# Patient Record
Sex: Female | Born: 1965 | Race: White | Hispanic: No | Marital: Married | Smoking: Former smoker
Health system: Southern US, Community
[De-identification: ages and names within clinical notes are randomized; demographics above are authoritative.]

## PROBLEM LIST (undated history)

## (undated) DIAGNOSIS — M7580 Other shoulder lesions, unspecified shoulder: Secondary | ICD-10-CM

## (undated) DIAGNOSIS — M549 Dorsalgia, unspecified: Secondary | ICD-10-CM

## (undated) DIAGNOSIS — G8929 Other chronic pain: Secondary | ICD-10-CM

## (undated) DIAGNOSIS — E785 Hyperlipidemia, unspecified: Secondary | ICD-10-CM

## (undated) DIAGNOSIS — A310 Pulmonary mycobacterial infection: Secondary | ICD-10-CM

## (undated) DIAGNOSIS — J449 Chronic obstructive pulmonary disease, unspecified: Secondary | ICD-10-CM

## (undated) DIAGNOSIS — I341 Nonrheumatic mitral (valve) prolapse: Secondary | ICD-10-CM

## (undated) DIAGNOSIS — K219 Gastro-esophageal reflux disease without esophagitis: Secondary | ICD-10-CM

## (undated) DIAGNOSIS — Z8701 Personal history of pneumonia (recurrent): Secondary | ICD-10-CM

## (undated) DIAGNOSIS — F419 Anxiety disorder, unspecified: Secondary | ICD-10-CM

## (undated) DIAGNOSIS — R112 Nausea with vomiting, unspecified: Secondary | ICD-10-CM

## (undated) DIAGNOSIS — Z780 Asymptomatic menopausal state: Secondary | ICD-10-CM

## (undated) DIAGNOSIS — G35 Multiple sclerosis: Secondary | ICD-10-CM

## (undated) DIAGNOSIS — R079 Chest pain, unspecified: Secondary | ICD-10-CM

## (undated) DIAGNOSIS — F329 Major depressive disorder, single episode, unspecified: Secondary | ICD-10-CM

## (undated) DIAGNOSIS — K529 Noninfective gastroenteritis and colitis, unspecified: Secondary | ICD-10-CM

## (undated) DIAGNOSIS — F17200 Nicotine dependence, unspecified, uncomplicated: Secondary | ICD-10-CM

## (undated) DIAGNOSIS — Z9889 Other specified postprocedural states: Secondary | ICD-10-CM

## (undated) DIAGNOSIS — J984 Other disorders of lung: Secondary | ICD-10-CM

## (undated) DIAGNOSIS — M1990 Unspecified osteoarthritis, unspecified site: Secondary | ICD-10-CM

## (undated) DIAGNOSIS — F32A Depression, unspecified: Secondary | ICD-10-CM

## (undated) DIAGNOSIS — G43909 Migraine, unspecified, not intractable, without status migrainosus: Secondary | ICD-10-CM

## (undated) HISTORY — DX: Chest pain, unspecified: R07.9

## (undated) HISTORY — DX: Other chronic pain: G89.29

## (undated) HISTORY — DX: Migraine, unspecified, not intractable, without status migrainosus: G43.909

## (undated) HISTORY — PX: LIVER BIOPSY: SHX301

## (undated) HISTORY — DX: Dorsalgia, unspecified: M54.9

## (undated) HISTORY — DX: Pulmonary mycobacterial infection: A31.0

## (undated) HISTORY — DX: Noninfective gastroenteritis and colitis, unspecified: K52.9

## (undated) HISTORY — DX: Anxiety disorder, unspecified: F41.9

## (undated) HISTORY — DX: Personal history of pneumonia (recurrent): Z87.01

## (undated) HISTORY — DX: Chronic obstructive pulmonary disease, unspecified: J44.9

## (undated) HISTORY — DX: Other shoulder lesions, unspecified shoulder: M75.80

## (undated) HISTORY — DX: Hyperlipidemia, unspecified: E78.5

## (undated) HISTORY — DX: Nonrheumatic mitral (valve) prolapse: I34.1

## (undated) HISTORY — DX: Gastro-esophageal reflux disease without esophagitis: K21.9

## (undated) HISTORY — DX: Depression, unspecified: F32.A

## (undated) HISTORY — DX: Nicotine dependence, unspecified, uncomplicated: F17.200

## (undated) HISTORY — DX: Asymptomatic menopausal state: Z78.0

## (undated) HISTORY — DX: Unspecified osteoarthritis, unspecified site: M19.90

## (undated) HISTORY — DX: Major depressive disorder, single episode, unspecified: F32.9

## (undated) HISTORY — DX: Multiple sclerosis: G35

---

## 1999-09-10 DIAGNOSIS — G35 Multiple sclerosis: Secondary | ICD-10-CM

## 1999-09-10 HISTORY — DX: Multiple sclerosis: G35

## 2001-02-17 ENCOUNTER — Ambulatory Visit (HOSPITAL_COMMUNITY): Admission: RE | Admit: 2001-02-17 | Discharge: 2001-02-17 | Payer: Self-pay | Admitting: Internal Medicine

## 2001-02-17 ENCOUNTER — Encounter: Payer: Self-pay | Admitting: Internal Medicine

## 2001-03-26 ENCOUNTER — Ambulatory Visit (HOSPITAL_COMMUNITY): Admission: RE | Admit: 2001-03-26 | Discharge: 2001-03-26 | Payer: Self-pay | Admitting: Internal Medicine

## 2001-03-26 ENCOUNTER — Encounter: Payer: Self-pay | Admitting: Internal Medicine

## 2001-03-27 ENCOUNTER — Ambulatory Visit (HOSPITAL_COMMUNITY): Admission: RE | Admit: 2001-03-27 | Discharge: 2001-03-30 | Payer: Self-pay | Admitting: Internal Medicine

## 2001-03-30 ENCOUNTER — Encounter: Payer: Self-pay | Admitting: Internal Medicine

## 2001-04-07 ENCOUNTER — Encounter: Payer: Self-pay | Admitting: Internal Medicine

## 2001-04-07 ENCOUNTER — Ambulatory Visit (HOSPITAL_COMMUNITY): Admission: RE | Admit: 2001-04-07 | Discharge: 2001-04-07 | Payer: Self-pay | Admitting: Internal Medicine

## 2001-04-24 ENCOUNTER — Ambulatory Visit (HOSPITAL_COMMUNITY): Admission: RE | Admit: 2001-04-24 | Discharge: 2001-04-24 | Payer: Self-pay | Admitting: Internal Medicine

## 2001-04-28 ENCOUNTER — Encounter: Payer: Self-pay | Admitting: Internal Medicine

## 2001-04-28 ENCOUNTER — Ambulatory Visit (HOSPITAL_COMMUNITY): Admission: RE | Admit: 2001-04-28 | Discharge: 2001-04-28 | Payer: Self-pay | Admitting: Internal Medicine

## 2001-09-09 DIAGNOSIS — R079 Chest pain, unspecified: Secondary | ICD-10-CM

## 2001-09-09 HISTORY — PX: ESOPHAGOGASTRODUODENOSCOPY: SHX1529

## 2001-09-09 HISTORY — DX: Chest pain, unspecified: R07.9

## 2001-09-09 HISTORY — PX: OTHER SURGICAL HISTORY: SHX169

## 2001-11-20 ENCOUNTER — Ambulatory Visit (HOSPITAL_COMMUNITY): Admission: RE | Admit: 2001-11-20 | Discharge: 2001-11-20 | Payer: Self-pay | Admitting: Pulmonary Disease

## 2001-11-23 ENCOUNTER — Encounter: Payer: Self-pay | Admitting: Infectious Diseases

## 2001-11-26 ENCOUNTER — Ambulatory Visit (HOSPITAL_COMMUNITY): Admission: RE | Admit: 2001-11-26 | Discharge: 2001-11-27 | Payer: Self-pay | Admitting: Pulmonary Disease

## 2001-11-26 ENCOUNTER — Ambulatory Visit (HOSPITAL_COMMUNITY): Admission: RE | Admit: 2001-11-26 | Discharge: 2001-11-26 | Payer: Self-pay | Admitting: Pulmonary Disease

## 2001-11-30 ENCOUNTER — Encounter: Payer: Self-pay | Admitting: Internal Medicine

## 2001-11-30 ENCOUNTER — Ambulatory Visit (HOSPITAL_COMMUNITY): Admission: RE | Admit: 2001-11-30 | Discharge: 2001-11-30 | Payer: Self-pay | Admitting: Internal Medicine

## 2002-01-05 ENCOUNTER — Other Ambulatory Visit: Admission: RE | Admit: 2002-01-05 | Discharge: 2002-01-05 | Payer: Self-pay | Admitting: Unknown Physician Specialty

## 2002-01-23 ENCOUNTER — Emergency Department (HOSPITAL_COMMUNITY): Admission: EM | Admit: 2002-01-23 | Discharge: 2002-01-23 | Payer: Self-pay | Admitting: *Deleted

## 2002-03-23 ENCOUNTER — Ambulatory Visit (HOSPITAL_COMMUNITY): Admission: RE | Admit: 2002-03-23 | Discharge: 2002-03-23 | Payer: Self-pay | Admitting: Internal Medicine

## 2002-05-24 ENCOUNTER — Encounter: Payer: Self-pay | Admitting: Internal Medicine

## 2002-05-24 ENCOUNTER — Ambulatory Visit (HOSPITAL_COMMUNITY): Admission: RE | Admit: 2002-05-24 | Discharge: 2002-05-24 | Payer: Self-pay | Admitting: Internal Medicine

## 2002-06-24 ENCOUNTER — Ambulatory Visit (HOSPITAL_COMMUNITY): Admission: RE | Admit: 2002-06-24 | Discharge: 2002-06-24 | Payer: Self-pay | Admitting: Neurosurgery

## 2002-07-30 ENCOUNTER — Ambulatory Visit (HOSPITAL_COMMUNITY): Admission: RE | Admit: 2002-07-30 | Discharge: 2002-07-30 | Payer: Self-pay | Admitting: Psychiatry

## 2002-07-30 ENCOUNTER — Encounter: Payer: Self-pay | Admitting: Psychiatry

## 2003-09-10 DIAGNOSIS — K529 Noninfective gastroenteritis and colitis, unspecified: Secondary | ICD-10-CM

## 2003-09-10 HISTORY — DX: Noninfective gastroenteritis and colitis, unspecified: K52.9

## 2003-10-10 ENCOUNTER — Ambulatory Visit (HOSPITAL_COMMUNITY): Admission: RE | Admit: 2003-10-10 | Discharge: 2003-10-10 | Payer: Self-pay | Admitting: Internal Medicine

## 2003-11-08 ENCOUNTER — Encounter: Payer: Self-pay | Admitting: Infectious Diseases

## 2003-11-08 ENCOUNTER — Ambulatory Visit (HOSPITAL_COMMUNITY): Admission: RE | Admit: 2003-11-08 | Discharge: 2003-11-08 | Payer: Self-pay | Admitting: Pulmonary Disease

## 2004-08-19 ENCOUNTER — Ambulatory Visit: Payer: Self-pay | Admitting: Internal Medicine

## 2004-08-19 ENCOUNTER — Inpatient Hospital Stay (HOSPITAL_COMMUNITY): Admission: EM | Admit: 2004-08-19 | Discharge: 2004-08-21 | Payer: Self-pay | Admitting: Emergency Medicine

## 2004-09-04 ENCOUNTER — Ambulatory Visit: Payer: Self-pay | Admitting: Internal Medicine

## 2004-09-09 HISTORY — PX: COLONOSCOPY W/ POLYPECTOMY: SHX1380

## 2004-09-12 ENCOUNTER — Ambulatory Visit: Payer: Self-pay | Admitting: Internal Medicine

## 2004-09-12 ENCOUNTER — Ambulatory Visit (HOSPITAL_COMMUNITY): Admission: RE | Admit: 2004-09-12 | Discharge: 2004-09-12 | Payer: Self-pay | Admitting: Internal Medicine

## 2004-12-31 ENCOUNTER — Ambulatory Visit (HOSPITAL_COMMUNITY): Admission: RE | Admit: 2004-12-31 | Discharge: 2004-12-31 | Payer: Self-pay | Admitting: Otolaryngology

## 2005-02-08 ENCOUNTER — Ambulatory Visit (HOSPITAL_COMMUNITY): Admission: RE | Admit: 2005-02-08 | Discharge: 2005-02-08 | Payer: Self-pay | Admitting: Otolaryngology

## 2005-03-28 ENCOUNTER — Ambulatory Visit (HOSPITAL_COMMUNITY): Admission: RE | Admit: 2005-03-28 | Discharge: 2005-03-28 | Payer: Self-pay | Admitting: Internal Medicine

## 2007-09-10 DIAGNOSIS — K219 Gastro-esophageal reflux disease without esophagitis: Secondary | ICD-10-CM

## 2007-09-10 DIAGNOSIS — E785 Hyperlipidemia, unspecified: Secondary | ICD-10-CM

## 2007-09-10 HISTORY — DX: Hyperlipidemia, unspecified: E78.5

## 2007-09-10 HISTORY — PX: ESOPHAGOGASTRODUODENOSCOPY: SHX1529

## 2007-09-10 HISTORY — DX: Gastro-esophageal reflux disease without esophagitis: K21.9

## 2007-11-17 ENCOUNTER — Emergency Department (HOSPITAL_COMMUNITY): Admission: EM | Admit: 2007-11-17 | Discharge: 2007-11-17 | Payer: Self-pay | Admitting: Emergency Medicine

## 2007-11-18 ENCOUNTER — Observation Stay (HOSPITAL_COMMUNITY): Admission: EM | Admit: 2007-11-18 | Discharge: 2007-11-18 | Payer: Self-pay | Admitting: Emergency Medicine

## 2008-03-15 ENCOUNTER — Ambulatory Visit (HOSPITAL_COMMUNITY): Admission: RE | Admit: 2008-03-15 | Discharge: 2008-03-15 | Payer: Self-pay | Admitting: Internal Medicine

## 2008-03-29 ENCOUNTER — Ambulatory Visit: Payer: Self-pay | Admitting: Internal Medicine

## 2008-04-14 ENCOUNTER — Ambulatory Visit: Payer: Self-pay | Admitting: Internal Medicine

## 2008-04-14 ENCOUNTER — Ambulatory Visit (HOSPITAL_COMMUNITY): Admission: RE | Admit: 2008-04-14 | Discharge: 2008-04-14 | Payer: Self-pay | Admitting: Internal Medicine

## 2008-08-22 ENCOUNTER — Ambulatory Visit (HOSPITAL_COMMUNITY): Admission: RE | Admit: 2008-08-22 | Discharge: 2008-08-22 | Payer: Self-pay | Admitting: Internal Medicine

## 2008-09-05 ENCOUNTER — Ambulatory Visit (HOSPITAL_COMMUNITY): Admission: RE | Admit: 2008-09-05 | Discharge: 2008-09-05 | Payer: Self-pay | Admitting: Internal Medicine

## 2008-09-09 DIAGNOSIS — A310 Pulmonary mycobacterial infection: Secondary | ICD-10-CM

## 2008-09-09 DIAGNOSIS — J984 Other disorders of lung: Secondary | ICD-10-CM

## 2008-09-09 HISTORY — DX: Other disorders of lung: J98.4

## 2008-09-09 HISTORY — DX: Pulmonary mycobacterial infection: A31.0

## 2008-09-09 HISTORY — PX: FIBEROPTIC BRONCHOSCOPY: SHX5367

## 2008-10-26 ENCOUNTER — Ambulatory Visit (HOSPITAL_COMMUNITY): Admission: RE | Admit: 2008-10-26 | Discharge: 2008-10-26 | Payer: Self-pay | Admitting: Internal Medicine

## 2008-10-26 ENCOUNTER — Encounter: Payer: Self-pay | Admitting: Infectious Diseases

## 2008-11-14 ENCOUNTER — Encounter: Payer: Self-pay | Admitting: Infectious Diseases

## 2008-11-16 ENCOUNTER — Ambulatory Visit (HOSPITAL_COMMUNITY): Admission: RE | Admit: 2008-11-16 | Discharge: 2008-11-16 | Payer: Self-pay | Admitting: Pulmonary Disease

## 2008-11-16 ENCOUNTER — Encounter (INDEPENDENT_AMBULATORY_CARE_PROVIDER_SITE_OTHER): Payer: Self-pay | Admitting: Pulmonary Disease

## 2008-11-28 ENCOUNTER — Ambulatory Visit: Payer: Self-pay | Admitting: Thoracic Surgery

## 2008-12-05 ENCOUNTER — Ambulatory Visit (HOSPITAL_COMMUNITY): Admission: RE | Admit: 2008-12-05 | Discharge: 2008-12-05 | Payer: Self-pay | Admitting: Thoracic Surgery

## 2008-12-05 ENCOUNTER — Encounter: Payer: Self-pay | Admitting: Thoracic Surgery

## 2008-12-05 ENCOUNTER — Ambulatory Visit: Payer: Self-pay | Admitting: Thoracic Surgery

## 2008-12-07 ENCOUNTER — Ambulatory Visit: Payer: Self-pay | Admitting: Thoracic Surgery

## 2008-12-07 ENCOUNTER — Encounter: Payer: Self-pay | Admitting: Infectious Diseases

## 2009-03-20 ENCOUNTER — Other Ambulatory Visit: Admission: RE | Admit: 2009-03-20 | Discharge: 2009-03-20 | Payer: Self-pay | Admitting: Obstetrics and Gynecology

## 2009-03-22 ENCOUNTER — Ambulatory Visit (HOSPITAL_COMMUNITY): Admission: RE | Admit: 2009-03-22 | Discharge: 2009-03-22 | Payer: Self-pay | Admitting: Obstetrics & Gynecology

## 2009-03-30 ENCOUNTER — Encounter: Payer: Self-pay | Admitting: Infectious Diseases

## 2009-03-30 DIAGNOSIS — G43909 Migraine, unspecified, not intractable, without status migrainosus: Secondary | ICD-10-CM

## 2009-03-30 DIAGNOSIS — R05 Cough: Secondary | ICD-10-CM

## 2009-03-30 DIAGNOSIS — R059 Cough, unspecified: Secondary | ICD-10-CM | POA: Insufficient documentation

## 2009-04-19 ENCOUNTER — Ambulatory Visit: Payer: Self-pay | Admitting: Infectious Diseases

## 2009-04-19 DIAGNOSIS — R5381 Other malaise: Secondary | ICD-10-CM | POA: Insufficient documentation

## 2009-04-19 DIAGNOSIS — R5383 Other fatigue: Secondary | ICD-10-CM

## 2009-04-19 LAB — CONVERTED CEMR LAB
ALT: <8 units/L (ref 0–35)
AST: 13 units/L (ref 0–37)
Albumin: 4.5 g/dL (ref 3.5–5.2)
Calcium: 9.7 mg/dL (ref 8.4–10.5)
Chloride: 108 meq/L (ref 96–112)
Creatinine, Ser: 1.04 mg/dL (ref 0.40–1.20)
Lymphocytes Relative: 42 % (ref 12–46)
Lymphs Abs: 3.7 10*3/uL (ref 0.7–4.0)
Monocytes Relative: 7 % (ref 3–12)
Neutro Abs: 4.4 10*3/uL (ref 1.7–7.7)
Neutrophils Relative %: 49 % (ref 43–77)
Potassium: 4.7 meq/L (ref 3.5–5.3)
RBC: 4.46 M/uL (ref 3.87–5.11)
Sed Rate: 2 mm/hr (ref 0–22)
Sodium: 140 meq/L (ref 135–145)
WBC: 8.9 10*3/uL (ref 4.0–10.5)

## 2009-04-21 ENCOUNTER — Telehealth: Payer: Self-pay | Admitting: Infectious Diseases

## 2009-04-25 ENCOUNTER — Ambulatory Visit (HOSPITAL_COMMUNITY): Admission: RE | Admit: 2009-04-25 | Discharge: 2009-04-25 | Payer: Self-pay | Admitting: Infectious Diseases

## 2009-10-31 ENCOUNTER — Ambulatory Visit: Payer: Self-pay | Admitting: Infectious Diseases

## 2009-10-31 ENCOUNTER — Ambulatory Visit (HOSPITAL_COMMUNITY): Admission: RE | Admit: 2009-10-31 | Discharge: 2009-10-31 | Payer: Self-pay | Admitting: Infectious Diseases

## 2009-10-31 DIAGNOSIS — M549 Dorsalgia, unspecified: Secondary | ICD-10-CM | POA: Insufficient documentation

## 2009-12-14 ENCOUNTER — Telehealth: Payer: Self-pay | Admitting: Infectious Diseases

## 2010-01-29 ENCOUNTER — Telehealth: Payer: Self-pay | Admitting: Infectious Diseases

## 2010-02-06 ENCOUNTER — Ambulatory Visit: Payer: Self-pay | Admitting: Infectious Diseases

## 2010-03-07 ENCOUNTER — Telehealth: Payer: Self-pay | Admitting: Infectious Diseases

## 2010-03-27 IMAGING — CT CT HEAD W/O CM
3 series · 18 of 30 positions shown, 20 images · non-contrast
Comparison: 11/30/2001

CLINICAL DATA: History migraines

CT HEAD WITHOUT CONTRAST
TECHNIQUE: Contiguous axial images were obtained from the base of
the skull through the vertex without contrast.

[Series 2: headseq 4.8 h37s · axial · 0.43mm/px · z∈[+72,+207]mm · 8 of 36 slices shown, 10 images (1 of 2)]
[im 4/36  brain]
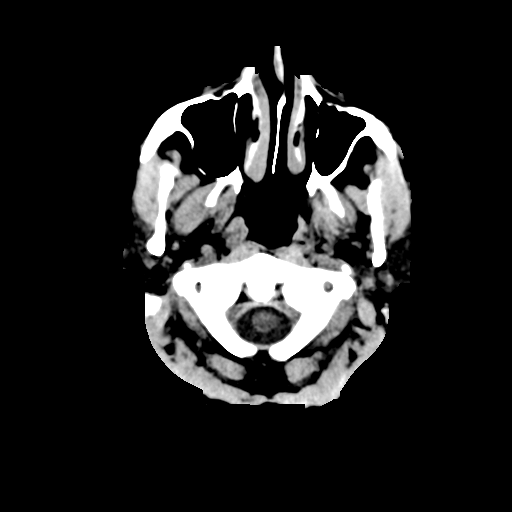
[im 4/36  bone]
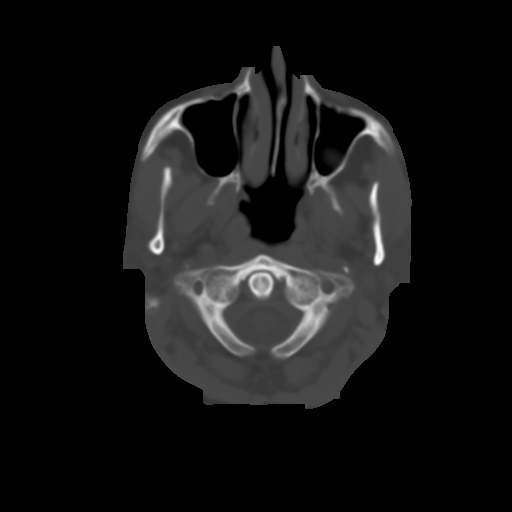
[im 8/36  brain]
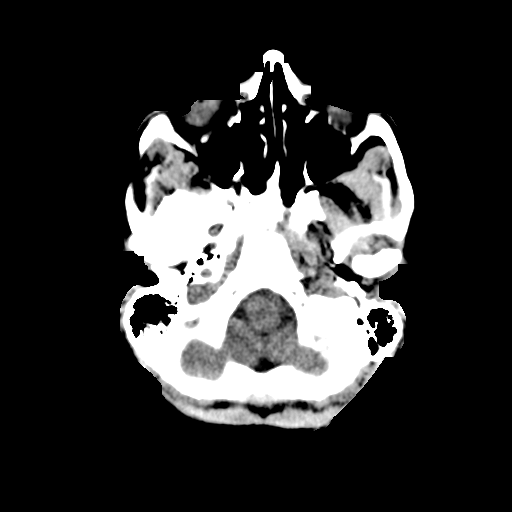
[im 12/36  brain]
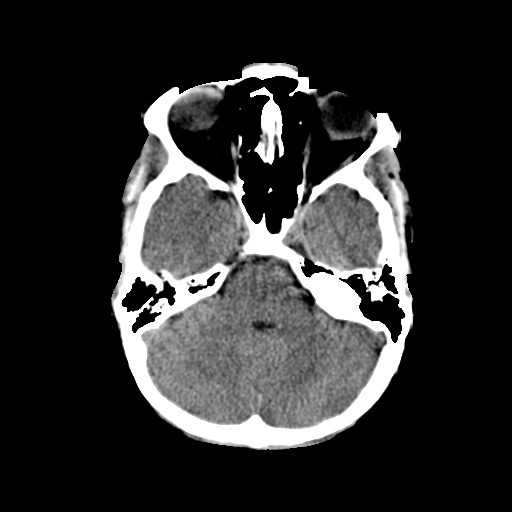
[im 16/36  brain]
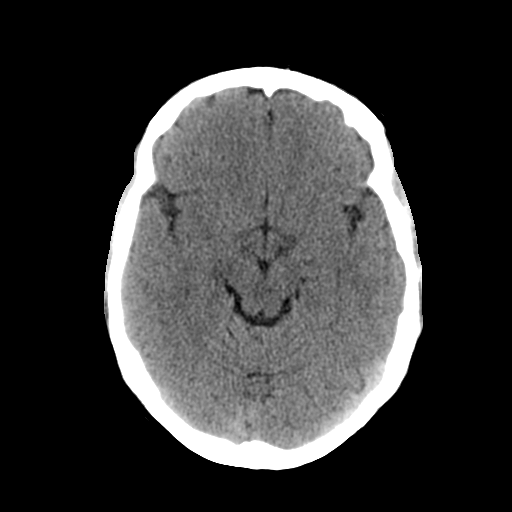
[im 20/36  brain]
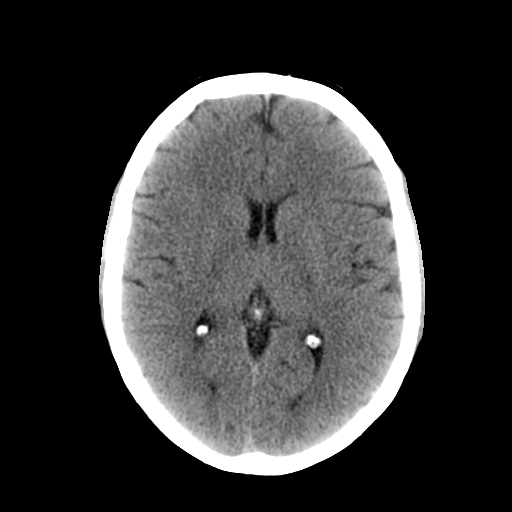
[im 20/36  bone]
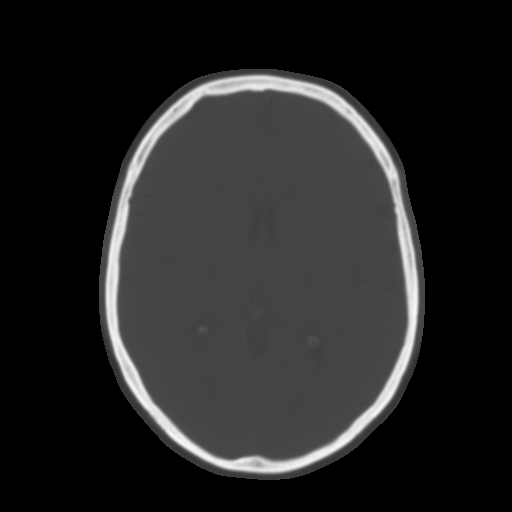
[im 24/36  brain]
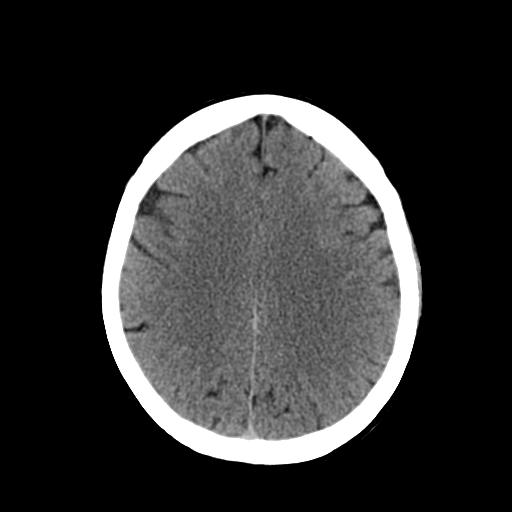
[im 28/36  brain]
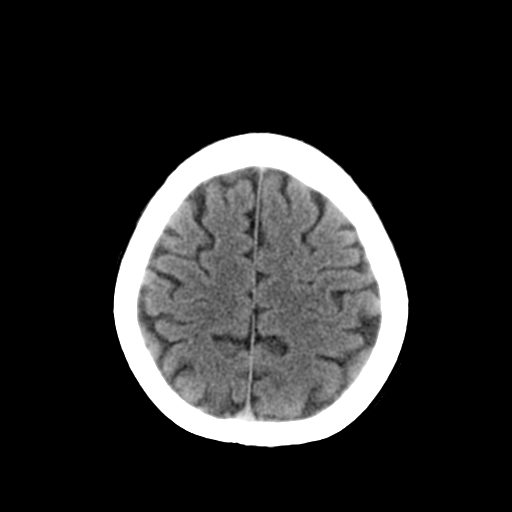
[im 32/36  brain]
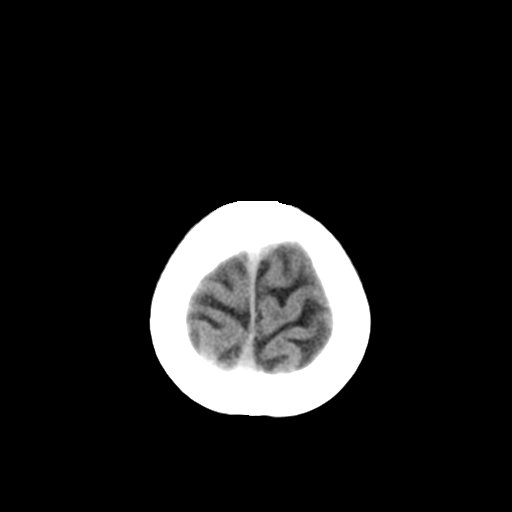

[Series 4: headseq 4.8 h37s · axial · 0.43mm/px · z∈[+72,+207]mm · 8 of 36 slices shown (2 of 2)]
[im 4/36  brain]
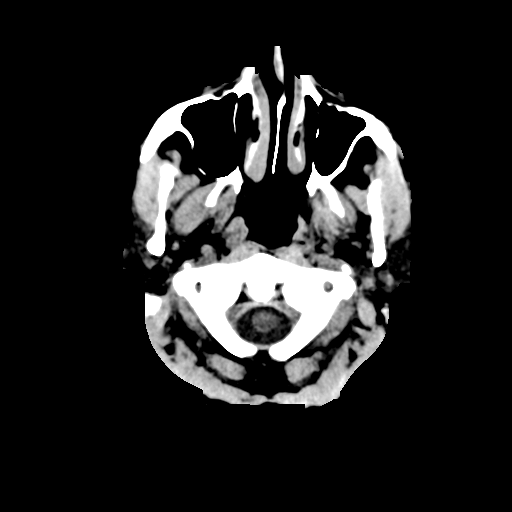
[im 8/36  brain]
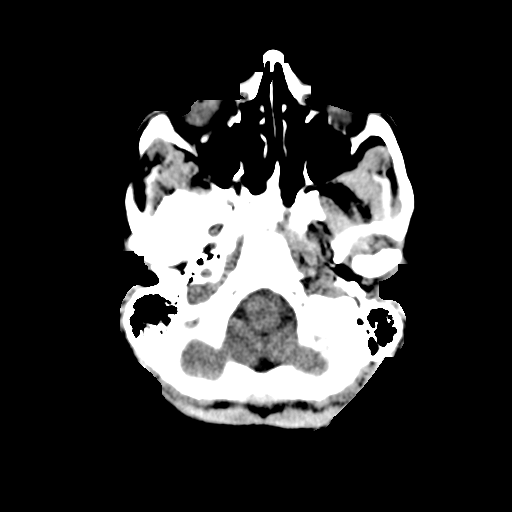
[im 12/36  brain]
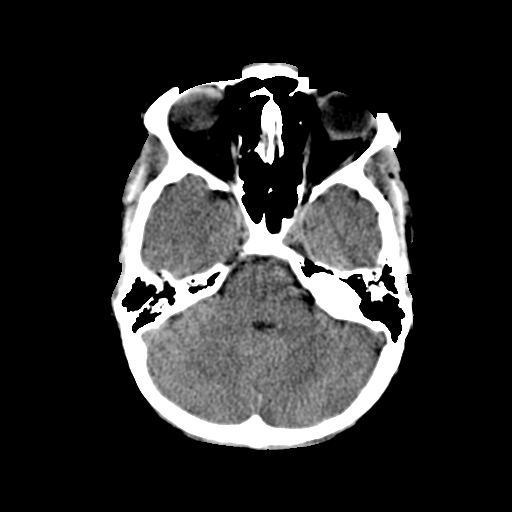
[im 16/36  brain]
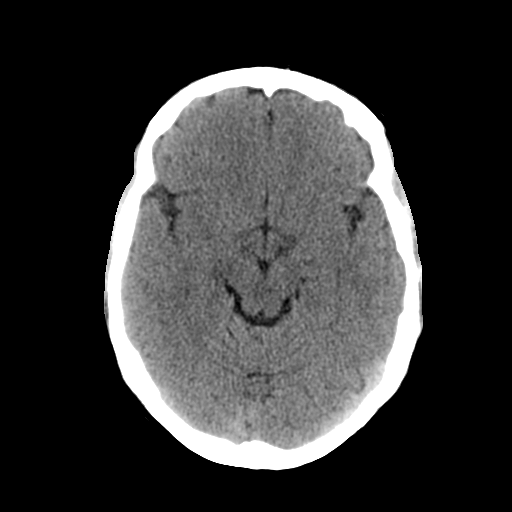
[im 20/36  brain]
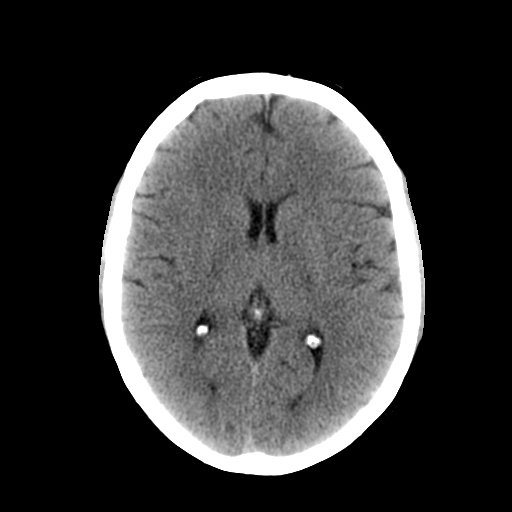
[im 24/36  brain]
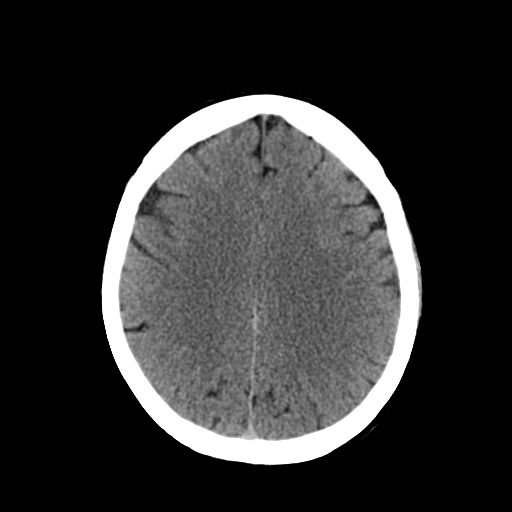
[im 28/36  brain]
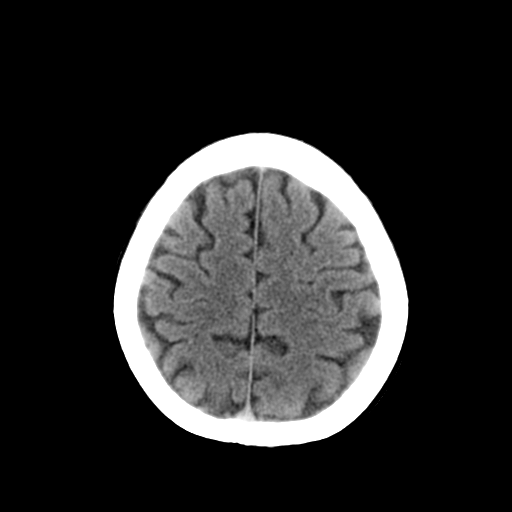
[im 32/36  brain]
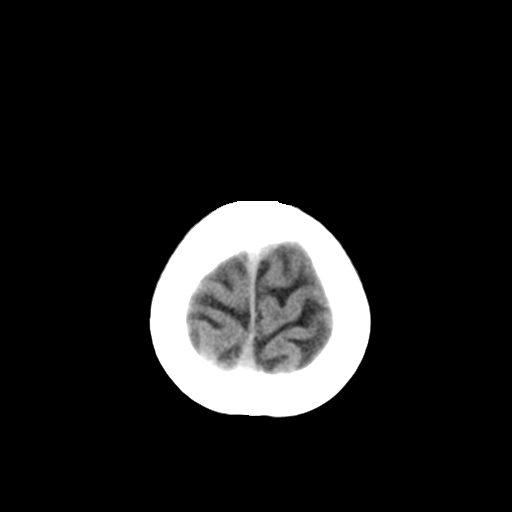

[Series 5: headseq 4.8 h30s · axial · 0.43mm/px · z∈[+72,+91]mm · 2 of 36 slices shown]
[im 4/36  brain]
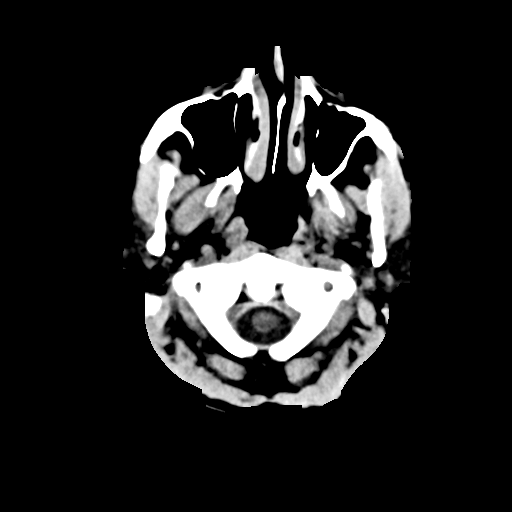
[im 8/36  brain]
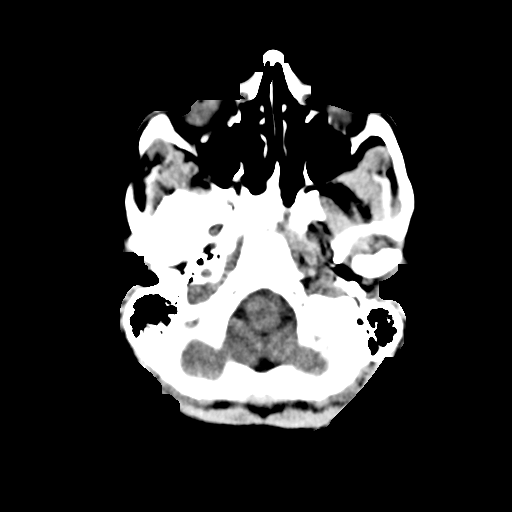

[18 of 30 positions shown; findings below may reference images not displayed]

FINDINGS: Normal ventricular morphology.
No midline shift or mass effect.
Normal appearance of brain parenchyma.
No intracranial hemorrhage, mass lesion, or acute infarct.
Mucosal retention cyst left maxillary sinus.
Incomplete posterior arch C1, normal variant.
Remaining sinuses clear and bones unremarkable.
IMPRESSION: No acute intracranial abnormalities.
If patient has persistent unexplained headaches, recommend follow-
up MRI brain with and without contrast to assess.

## 2010-04-07 ENCOUNTER — Emergency Department (HOSPITAL_COMMUNITY)
Admission: EM | Admit: 2010-04-07 | Discharge: 2010-04-07 | Payer: Self-pay | Source: Home / Self Care | Admitting: Emergency Medicine

## 2010-04-12 ENCOUNTER — Ambulatory Visit (HOSPITAL_COMMUNITY): Admission: RE | Admit: 2010-04-12 | Discharge: 2010-04-13 | Payer: Self-pay | Admitting: Orthopedic Surgery

## 2010-05-25 ENCOUNTER — Encounter (HOSPITAL_COMMUNITY)
Admission: RE | Admit: 2010-05-25 | Discharge: 2010-06-08 | Payer: Self-pay | Source: Home / Self Care | Admitting: Orthopedic Surgery

## 2010-06-11 ENCOUNTER — Encounter (HOSPITAL_COMMUNITY): Admission: RE | Admit: 2010-06-11 | Discharge: 2010-07-11 | Payer: Self-pay | Admitting: Orthopedic Surgery

## 2010-07-12 ENCOUNTER — Encounter (HOSPITAL_COMMUNITY)
Admission: RE | Admit: 2010-07-12 | Discharge: 2010-08-11 | Payer: Self-pay | Source: Home / Self Care | Admitting: Orthopedic Surgery

## 2010-08-14 ENCOUNTER — Encounter (HOSPITAL_COMMUNITY)
Admission: RE | Admit: 2010-08-14 | Discharge: 2010-09-13 | Payer: Self-pay | Source: Home / Self Care | Attending: Orthopedic Surgery | Admitting: Orthopedic Surgery

## 2010-09-09 HISTORY — PX: ORIF RADIAL FRACTURE: SHX5113

## 2010-09-17 ENCOUNTER — Encounter (HOSPITAL_COMMUNITY)
Admission: RE | Admit: 2010-09-17 | Discharge: 2010-10-09 | Payer: Self-pay | Source: Home / Self Care | Attending: Orthopedic Surgery | Admitting: Orthopedic Surgery

## 2010-09-30 ENCOUNTER — Encounter: Payer: Self-pay | Admitting: Internal Medicine

## 2010-10-09 NOTE — Progress Notes (Signed)
Summary: Problems with meds  Phone Note Call from Patient   Summary of Call: The rifampin has caused pt to have  yellow skin, dark urine and blisters inside her mouth. She also has a headache.   She did not start the new medication on Saturday either.   She is very concerned because this medication was very expensive. Please advise. Tomasita Morrow RN  Jan 29, 2010 11:28 AM  Initial call taken by: Clydie Braun MD,  Jan 29, 2010 10:41 PM  Follow-up for Phone Call        she needs lfs checked.  can we squeeze her in this week for quick visit Follow-up by: Clydie Braun MD,  Jan 29, 2010 10:41 PM  Additional Follow-up for Phone Call Additional follow up Details #1::        No available work in appts with  Dr Sampson Goon this week. Pt was advised to d/c the rifampin and not to take any medications until her Feb 06, 2010 appt with Dr Sampson Goon.   Pt stated she stopped the Rifampin on Monday. She will come for the OV next week. She was advised to call if symptoms worsen or go the ED for eval.  Tomasita Morrow RN  Jan 30, 2010 3:12 PM

## 2010-10-09 NOTE — Assessment & Plan Note (Signed)
Summary: 2 MONTH F/U/VS   Primary Provider:  Dr Shaune Pollack Dr Audie Pinto  CC:  2 month follow up.  History of Present Illness: 45 yo with  history of asthma referred by Dr Juanetta Gosling for evaluation of MAC from BAL cx.    She reports that in 2003 had nodule and polp removed from vocal cords since then had continued ST and ear pain.  Has had ENT evaluation sice then. IN Jan she had SOB and had CT done with ? papillomatous so had Scope done by Dr Edwyna Shell and Dr Jearld Fenton.  No papilloma noted just mucus.  Path neg for malignancy. .  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   Referred for ? of treatment  SHe had a high res ct doen in august 2010   Currently reports occas sob, occas non productive cough.  No fevrs ns. or wt loss. She still feels "bad all over" Current pain is in upper back mainly on right.  Pain is a little worse with breathing and has awoken her at night.  Hasbeen ongoing for a few weeks now.  No injury prior.  Has not had evaluation for the back pain.  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   Has a history of a PNA as a child.  She does smoke since age 33 for a   Today 5/31 she is here because she started the rifampin last week and developed redness in urine and sores in mouth.  SHe had started the azithro without much difficulty.  She never started the ethambutol    Preventive Screening-Counseling & Management  Alcohol-Tobacco     Alcohol drinks/day: 0     Smoking Status: current     Packs/Day: 0.5     Pack years: 25  Caffeine-Diet-Exercise     Caffeine use/day: coffee and sodas sometimes     Does Patient Exercise: no  Safety-Violence-Falls     Seat Belt Use: yes   Updated Prior Medication List: ALPRAZOLAM 1 MG TABS (ALPRAZOLAM) take one - two as needed anxiety LORTAB 10 10-500 MG TABS (HYDROCODONE-ACETAMINOPHEN) Take 1 tablet by mouth once a day FISH OIL 1000 MG CAPS (OMEGA-3 FATTY ACIDS) one  daily B COMPLEX-B12  TABS (B COMPLEX VITAMINS) once daily AZITHROMYCIN 500 MG TABS (AZITHROMYCIN) one by mouth daily RIFADIN 300 MG CAPS (RIFAMPIN) 2 by mouth once daily MYAMBUTOL 400 MG TABS (ETHAMBUTOL HCL) take four tablets three times a week LORTAB 5-500 MG TABS (HYDROCODONE-ACETAMINOPHEN) one by mouth q 6 hours as needed for back pain  Current Allergies (reviewed today): ! MORPHINE ! 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 No alcohol 2 dogs at home No travel or TB exposures  Risk Factors: Alcohol Use: 0 (02/06/2010) Caffeine Use: coffee and sodas sometimes (02/06/2010) Exercise: no (02/06/2010)  Risk Factors: Smoking Status: current (02/06/2010) Packs/Day: 0.5 (02/06/2010)  Review of Systems       11 systems reviewed and negative except per HPI   Vital Signs:  Patient profile:   45 year old female Height:  71 inches (180.34 cm) Weight:      147.8 pounds (67.18 kg) BMI:     20.69 Temp:     98.3 degrees F (36.83 degrees C) oral Pulse rate:   67 / minute BP sitting:   128 / 84  (right arm)  Vitals Entered By: Baxter Hire) (Feb 06, 2010 9:31 AM) CC: 2 month follow up Pain Assessment Patient in pain? yes     Location: upper back/lower Intensity: 8 Type: throbbing/aching Onset of pain  Constant Nutritional Status BMI of 19 -24 = normal Nutritional Status Detail appetite is okay per patient  Does patient need assistance? Functional Status Self care Ambulation Normal   Physical Exam  General:  alert and well-developed.   Head:  normocephalic.   Mouth:  good dentition.   Lungs:   normal respiratory effort and no intercostal retractions.   Heart:  normal rate and regular rhythm.   Abdomen:  soft, non-tender, normal bowel sounds, and no distention.   Msk:  normal ROM and no joint tenderness.   Extremities:  no cce  Neurologic:  alert & oriented X3.   Skin:  no rashes.   Cervical Nodes:  no anterior cervical adenopathy and no posterior cervical adenopathy.   Psych:  Oriented X3 and memory intact for recent and remote.     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  AFB cx + for MAI.  DIfficult to know if here sxs are caused by MAI but could be.  Repeat CT showed continued bronchiectasis  She likely needs treatment so will start rx for mai triple therapy and follow up.  She will try to get the meds filled in [facility].  I think it would be best to embark on treatment at this time.  I willfollow up in 2 months.  WIlllikely need >12 mos of therapy per IDSA guidelines  Orders: Consultation Level IV ([address]) CT without Contrast (CT w/o contrast) T-C-Reactive Protein ([id number]) T-CBC w/Diff ([id number]) T-Comprehensive Metabolic Panel ([id number]) T-Sed Rate (Automated) (85652-15010)Future Orders: T-Culture, Sputum & Gram Stain ([id number]) ... 04/26/2009  Orders: No Charge Patient Arrived (NCPA0) (NCPA0) T- * Misc. Laboratory test [id number]) CXR- 2view (CXR)  Problem # 2:  BACK PAIN, ACUTE (ICD-724.5) She has schmorls nodes on cxr and likely related to her back pain. Her updated medication list for this problem includes:    Lortab 10 10-500 Mg Tabs (Hydrocodone-acetaminophen) .Marland Kitchen... Take 1 tablet by mouth once a day    Lortab 5-500 Mg Tabs (Hydrocodone-acetaminophen) ..... One by mouth q 6 hours as needed for back pain  Problem # 3:  FATIGUE  (ICD-780.79)  check cbc and comp. Has had TSH per pt. Orders: Consultation Level IV (28413)  Medications Added to Medication List This Visit: 1)  Lortab 5-500 Mg Tabs (Hydrocodone-acetaminophen) .... One by mouth q 6 hours as needed for back pain  Patient Instructions: 1)  Restart the azithrmycin three times a week. 2)  Start the ethambutol 2 tabs three times a week for one week, then go to 4 tabs three times a week.   3)  You will need these meds for a year.   4)  Follow up in 6 months. Prescriptions: LORTAB 5-500 MG TABS (HYDROCODONE-ACETAMINOPHEN) one by mouth q 6 hours as needed for back  pain  #100 x 0   Entered and Authorized by:   Clydie Braun MD   Signed by:   Clydie Braun MD on 02/06/2010   Method used:   Print then Give to Patient   RxID:   [id number]

## 2010-10-09 NOTE — Progress Notes (Signed)
Summary: Patient called to pick up medication  Phone Note Other Incoming   Summary of Call: spoke to her mdand she cannot afford the meds.  - Dr Sherwood Gambler at [facility] medical Initial call taken by: Clydie Braun MD,  December 14, 2009 2:58 PM  Follow-up for Phone Call        We have azithromycin available in clinic however would need to have other 2 meds as well.  she says the ethambutol is 86$ rifampin was 100$ azithro was 156$  I will try to call health dept re whether this is available cheaper there.  Per Byrd Hesselbach there is no program she knows of for these other meds Follow-up by: Clydie Braun MD,  December 19, 2009 3:26 PM  Additional Follow-up for Phone Call Additional follow up Details #1::        Patient called and said she would come and pick up the azithromycin. I will put it up at the front desk. Additional Follow-up by: Tomasita Morrow RN,  December 26, 2009 4:06 PM    Additional Follow-up for Phone Call Additional follow up Details #2::    left message at [facility] Follow-up by: Clydie Braun MD,  Jan 09, 2010 9:49 AM  Additional Follow-up for Phone Call Additional follow up Details #3:: Details for Additional Follow-up Action Taken: Patient came to pick up medication  Sample Given, Lot #: Azithromycin 500mg  [id number] Expiration Date:Feb 06, 2010 Patient has been instructed regarding the correct time, dose and frequency of taking this med, including desired effects and most common side effects.  Additional Follow-up by: Paulo Fruit  BS,CPht II,MPH,  Jan 09, 2010 11:04 AM

## 2010-10-09 NOTE — Progress Notes (Signed)
Summary: problems with medications  Phone Note Call from Patient   Caller: Patient Call For: Clydie Braun MD Summary of Call: Patient taking Ethambutol and azithromycin, having frequent nose bleeds, swollen glands in the morning, blisters on tongue, very agitated, having vision problems. She thinks it is from the medication. Initial call taken by: Starleen Arms CMA,  March 07, 2010 4:16 PM  Follow-up for Phone Call        can stop meds. needs f/u visit.Marland KitchenMarland Kitchen

## 2010-10-09 NOTE — Assessment & Plan Note (Signed)
Summary: F/U appt/kdw   Primary Provider:  Dr Shaune Pollack Dr Audie Pinto  CC:  follow-up visit.  History of Present Illness: 45 yo with  history of asthma referred by Dr Juanetta Gosling for evaluation of MAC from BAL cx.    She reports that in 2003 had nodule and polp removed from vocal cords since then had continued ST and ear pain.  Has had ENT evaluation sice then. IN Jan she had SOB and had CT done with ? papillomatous so had Scope done by Dr Edwyna Shell and Dr Jearld Fenton.  No papilloma noted just mucus.  Path neg for malignancy. .  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   Referred for ? of treatment  SHe had a high res ct doen in august 2010   Currently reports occas sob, occas non productive cough.  No fevrs ns. or wt loss. She still feels "bad all over" Current pain is in upper back mainly on right.  Pain is a little worse with breathing and has awoken her at night.  Hasbeen ongoing for a few weeks now.  No injury prior.  Has not had evaluation for the back pain.  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    Has a history of a PNA as a child.  She does smoke since age 25 for a ppd.    Preventive Screening-Counseling & Management  Alcohol-Tobacco     Alcohol drinks/day: 0     Smoking Status: current     Packs/Day: 0.5  Caffeine-Diet-Exercise     Caffeine use/day: coffee and sodas sometimes     Does Patient Exercise: no  Safety-Violence-Falls     Seat Belt Use: yes   Updated Prior Medication List: ALPRAZOLAM 1 MG TABS (ALPRAZOLAM) take one - two as needed anxiety LORTAB 10 10-500 MG TABS (HYDROCODONE-ACETAMINOPHEN) Take 1 tablet by mouth once a day FISH OIL 1000 MG CAPS (OMEGA-3 FATTY ACIDS) one daily B COMPLEX-B12  TABS (B COMPLEX VITAMINS) once daily  Current Allergies (reviewed today): ! 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  -   Review of Systems       11 systems reviewed and negative except per HPI   Vital Signs:  Patient profile:   45 year old female Height:      71 inches (180.34 cm) Weight:      148.5 pounds (67.50 kg) BMI:     20.79 Temp:     97.0 degrees F (36.11 degrees C) oral Pulse rate:   68 / minute BP sitting:   130 / 77  (left arm)  Vitals Entered By: Baxter Hire) (October 31, 2009 10:35 AM) CC: follow-up visit Pain Assessment Patient in pain? yes     Location: mid back area Intensity: 8 Type: throbbing, aching Onset of pain  Constant pain for the last month Nutritional Status BMI of 19 -24 =  normal Nutritional Status Detail appetite is okay per patient  Does patient need assistance? Functional Status Self care Ambulation Normal   Physical Exam  General:  alert and well-developed.   Head:  normocephalic.   Eyes:  vision grossly intact, pupils equal, and pupils round.   Mouth:  good dentition.   Neck:  supple.   Lungs:  normal respiratory effort and normal breath sounds.   Heart:  normal rate and regular rhythm.   Abdomen:  soft and non-tender.   Msk:  mild ttp over r posterior mid ribs Extremities:  no cce Neurologic:  alert & oriented X3 and cranial nerves II-XII intact.   Skin:  no rashes.   Cervical Nodes:  no anterior cervical adenopathy and no posterior cervical adenopathy.   Psych:  Oriented X3.   Additional Exam:  CT chest 8/20101.  Scattered  airway plugging particularly in the right upper lobe and left lower lobe.  These areas of airway plugging are new, and small filling defects in the trachea and proximal bronchial tree also appear changed from the prior exam, indicating that these probably represent mucus rather than tracheal papillomatosis. 2.  There is borderline cylindrical bronchiectasis.   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  AFB cx + for MAI.  DIfficult to know if here sxs are caused by MAI but could be.  Repeat CT showed continued bronchiectasis  She likely needs treatment so will start rx for mai triple therapy and follow up.  She will try to get the meds filled in [facility].  I think it would be best to embark on treatment at this time.  I willfollow up in 2 months.  WIlllikely need >12 mos of therapy per IDSA guidelines  Orders: Consultation Level IV (04540) CT without Contrast (CT w/o contrast) T-C-Reactive Protein ([id number]) T-CBC w/Diff ([id number]) T-Comprehensive Metabolic Panel ([id number]) T-Sed Rate (Automated) (85652-15010)Future Orders: T-Culture, Sputum & Gram Stain ([id number]) ... 04/26/2009  Orders: No Charge Patient Arrived (NCPA0) (NCPA0) T- * Misc. Laboratory test [phone]) CXR- 2view (CXR)  Problem # 2:  BACK PAIN, ACUTE (ICD-724.5)  Likely related to her lung issues.  WIll check cxr.  Her updated medication list for this problem includes:    Lortab 10 10-500 Mg Tabs (Hydrocodone-acetaminophen) .Marland Kitchen... Take 1 tablet by mouth once a day  Orders: No Charge Patient Arrived (NCPA0) (NCPA0)  Medications Added to Medication List This Visit: 1)  Azithromycin 500 Mg Tabs (Azithromycin) .... One by mouth daily 2)  Rifadin 300 Mg Caps (Rifampin) .... 2 by mouth  once daily 3)  Myambutol 400 Mg Tabs (Ethambutol hcl) .... Take four tablets three times a week  Allergies: 1)  ! Morphine   Patient Instructions: 1)  Start azithromycin 500 mg three times a week.  In one week if all goes well start the rifampin 600 mg three times a week (2 pills).  If that is going ok then start ethambutol 1600 mg (4 tabs) three times a week.   2)  Please schedule a follow-up appointment in 2 month. 3)  Call if new or concerning symptoms. Prescriptions: MYAMBUTOL 400 MG TABS (ETHAMBUTOL HCL) take four tablets three times a week  #60 x 11   Entered and Authorized by:   Clydie Braun MD   Signed by:  Clydie Braun MD on 10/31/2009   Method used:   Print then Give to Patient   RxID:   [id number] RIFADIN 300 MG CAPS (RIFAMPIN) 2 by mouth once daily  #60 x 11   Entered and Authorized by:   Clydie Braun MD   Signed by:   Clydie Braun MD on 10/31/2009   Method used:   Print then Give to Patient   RxID:   [id number] AZITHROMYCIN 500 MG TABS (AZITHROMYCIN) one by mouth daily  #30 x 11   Entered and Authorized by:   Clydie Braun MD   Signed by:   Clydie Braun MD on 10/31/2009   Method used:   Print then Give to Patient   RxID:   [id number]  Process Orders Check Orders Results:     [facility]: ABN not required for this insurance Tests Sent for requisitioning (November 16, 2009 9:24 AM):     10/31/2009: [facility] -- T- * Misc. Laboratory test [phone] (signed)

## 2010-11-23 LAB — COMPREHENSIVE METABOLIC PANEL
ALT: 18 U/L (ref 0–35)
AST: 25 U/L (ref 0–37)
CO2: 28 mEq/L (ref 19–32)
Calcium: 9.4 mg/dL (ref 8.4–10.5)
GFR calc Af Amer: >60 mL/min (ref >60)
Sodium: 140 mEq/L (ref 135–145)
Total Protein: 6.8 g/dL (ref 6.0–8.3)

## 2010-11-23 LAB — CBC
Hemoglobin: 14.1 g/dL (ref 12.0–15.0)
MCHC: 34.4 g/dL (ref 30.0–36.0)
RDW: 13.1 % (ref 11.5–15.5)
WBC: 10.2 10*3/uL (ref 4.0–10.5)

## 2010-11-23 LAB — DIFFERENTIAL
Eosinophils Absolute: 0.2 10*3/uL (ref 0.0–0.7)
Eosinophils Relative: 2 % (ref 0–5)
Lymphs Abs: 3 10*3/uL (ref 0.7–4.0)
Monocytes Relative: 4 % (ref 3–12)

## 2010-12-18 ENCOUNTER — Encounter: Payer: Self-pay | Admitting: *Deleted

## 2010-12-20 LAB — APTT: aPTT: 34 seconds (ref 24–37)

## 2010-12-20 LAB — AFB CULTURE WITH SMEAR (NOT AT ARMC)

## 2010-12-20 LAB — CULTURE, RESPIRATORY W GRAM STAIN

## 2010-12-20 LAB — CBC
MCHC: 34.3 g/dL (ref 30.0–36.0)
MCV: 97.7 fL (ref 78.0–100.0)
Platelets: 269 10*3/uL (ref 150–400)

## 2010-12-20 LAB — COMPREHENSIVE METABOLIC PANEL
AST: 17 U/L (ref 0–37)
Albumin: 4 g/dL (ref 3.5–5.2)
Calcium: 9.3 mg/dL (ref 8.4–10.5)
Creatinine, Ser: 0.85 mg/dL (ref 0.4–1.2)
GFR calc Af Amer: >60 mL/min (ref >60)
GFR calc non Af Amer: >60 mL/min (ref >60)

## 2010-12-20 LAB — FUNGUS CULTURE W SMEAR

## 2011-01-21 ENCOUNTER — Emergency Department (HOSPITAL_COMMUNITY)
Admission: EM | Admit: 2011-01-21 | Discharge: 2011-01-21 | Disposition: A | Discharge status: Home / Self Care | Payer: Self-pay | Attending: Emergency Medicine | Admitting: Emergency Medicine

## 2011-01-21 DIAGNOSIS — G500 Trigeminal neuralgia: Secondary | ICD-10-CM | POA: Insufficient documentation

## 2011-01-21 DIAGNOSIS — F17200 Nicotine dependence, unspecified, uncomplicated: Secondary | ICD-10-CM | POA: Insufficient documentation

## 2011-01-21 DIAGNOSIS — K219 Gastro-esophageal reflux disease without esophagitis: Secondary | ICD-10-CM | POA: Insufficient documentation

## 2011-01-21 DIAGNOSIS — R51 Headache: Secondary | ICD-10-CM | POA: Insufficient documentation

## 2011-01-21 DIAGNOSIS — Z79899 Other long term (current) drug therapy: Secondary | ICD-10-CM | POA: Insufficient documentation

## 2011-01-22 NOTE — Op Note (Signed)
Sharon Gay, Sharon Gay               ACCOUNT NO.:  [id number]   MEDICAL RECORD NO.:  [id number]          PATIENT TYPE:  AMB   LOCATION:  SDS                          FACILITY:  [facility]   PHYSICIAN:  Ines Bloomer, M.D. 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   POSTOPERATIVE DIAGNOSIS:  Multiple mucous secretions in tracheobronchial  tree.   OPERATION PERFORMED:  Fiberoptic bronchoscopy and general anesthesia.   PROCEDURE:  This patient had a previous bronchoscopy and was thought to  have multiple nodules in her tracheobronchial tree and underwent  attempted bronchoscopy, but we could not get good visualization.  A CT  scan showed multiple areas of possible nodules in the tracheobronchial  tree.  Prior to putting sleep, a direct laryngoscopy was performed by  Dr. Jearld Fenton and did not see anything on the cords and the subglottic  areas, and the endotracheal tube was inserted and the video bronchoscope  was inserted.  The carina was in the midline and  then we immediately  noticed multiple areas of secretions in both the left and right mainstem  bronchus.  There were very thick secretions.  We irrigated these out.  The left upper lobe, left lower lobe orifices were normal.  The left  mainstem bronchus was normal.  The right mainstem, right upper lobe  orifices were normal.  Carina was normal.  All these areas we thought we  saw nodules on the CT scan were completely normal, so it was thought  that this abnormalities were secondary to retained secretions.  We then  pulled the endotracheal tube back all the way to the cords and did not  see any evidence of any lesions in the subglottic area as well as in the  trachea.  Pictures were taken of the carina and the right middle lobe,  right mainstem, left mainstem bronchus.  The patient was returned to the  recovery room in stable condition.      Ines Bloomer, M.D.  Electronically Signed     DPB/MEDQ  D:  12/05/2008  T:  12/05/2008  Job:  [id number]

## 2011-01-22 NOTE — Op Note (Signed)
Sharon Gay, Sharon Gay               ACCOUNT NO.:  [id number]   MEDICAL RECORD NO.:  [id number]          PATIENT TYPE:  AMB   LOCATION:  DAY                           FACILITY:  [facility]   PHYSICIAN:  Edward L. Juanetta Gosling, M.D.DATE OF BIRTH:  [birth year]   DATE OF PROCEDURE:  DATE OF DISCHARGE:                               OPERATIVE REPORT   INDICATION FOR PROCEDURE:  Ms. Hunkins had a CT scan that showed what  appeared to be tracheal papillomatosis.  She has had papillomas on her  vocal cords in the past.  She is undergoing bronchoscopy to try to make  sure there is no other diagnosis.   PREOPERATIVE DIAGNOSES:  Tracheal papillomatosis.   POSTOPERATIVE DIAGNOSIS:  Tracheal papillomatosis.   PROCEDURE:  Fiberoptic bronchoscopy with brushings.   SURGEON:  Edward L. Juanetta Gosling, MD   After satisfactory local anesthesia and 10 mg of Versed and 100 mcg of  fentanyl IV.  The bronchoscope was introduced into the trachea.  There  was a great deal of difficulty in getting into trachea because she had  difficulty in getting her sedated.  Eventually, the bronchoscope was  introduced into the trachea, and there were papillomas seen.  She became  very agitated, attempted to pull the bronchoscope out.  So I got a quick  brushing of some of the papillomas and then terminated the procedure.  If she needs to have something else done probably, she is going to need  to have a general anesthesia and I think if she needs any treatment for  this, she may need some sort of a surgical perhaps laser procedure, etc.      Edward L. Juanetta Gosling, M.D.  Electronically Signed     ELH/MEDQ  D:  11/16/2008  T:  11/16/2008  Job:  [id number]   cc:   Madelin Rear. Sherwood Gambler, MD  Fax: [phone]

## 2011-01-22 NOTE — Letter (Signed)
December 07, 2008   Edward L. Juanetta Gosling, MD  [address]  [place], [place] [address]   Re:  IMOGINE, CARVELL               DOB:  November 17, 1965   Dear Renae Fickle,   I saw the patient back today.  She is doing well after her bronchoscopy.  Dr. Jearld Fenton and I did a direct laryngoscopy and bronchoscopy.  At that  time, he saw no evidence of problems with the cords or anything in the  subglottic area.  I then did a bronchoscopy and found multiple areas of  mucus in the tracheobronchial tree, which we had irrigated out with  large amount of saline solution.  I did not see evidence of any  papillomas or any other type of tumors, so I think what was seen on the  CT scan was probably multiple areas of mucus and we were able to get a  good look because of general anesthesia.  We did pull the endotracheal  tube all the way out and so I was able look at the lesion all the way  out and took pictures of this and did not see any other error, so I  think her main problem was just severe mucus production.  I did culture  this and will let you know what cultures show.  She still complains of  sore throat and I told her if this continues, she may want to follow up  with Dr. Jearld Fenton, but from my standpoint everything looks good and there  is no evidence to do any type of endobronchial intervention.   Her blood pressure was 125/78, pulse 70, respirations 18, sats were 99%.   Sincerely,   Ines Bloomer, M.D.  Electronically Signed   DPB/MEDQ  D:  12/07/2008  T:  12/08/2008  Job:  [id number]

## 2011-01-22 NOTE — Op Note (Signed)
NAMECADINCE, HILSCHER               ACCOUNT NO.:  [id number]   MEDICAL RECORD NO.:  [id number]           PATIENT TYPE:   LOCATION:                                 FACILITY:   PHYSICIAN:  R. Roetta Sessions, M.D. DATE OF BIRTH:  04-04-1966   DATE OF PROCEDURE:  DATE OF DISCHARGE:                               OPERATIVE REPORT   A 45 year old lady with recurrent esophageal dysphagia and background  setting of gastroesophageal reflux disease, history of Schatzki's ring  dilated previously.  She is not on aspiration therapy currently.  EGD  now being done.  Potential esophageal dilation were reviewed.  Risks,  benefits, alternatives, and limitation have been discussed.  Please see  the documentation in medical record.   PROCEDURE NOTE:  O2 saturation, blood pressure, and pulse rate, and  respirations were monitored throughout the entire procedure.   CONSCIOUS SEDATION:  Versed 4 mg IV, Demerol 100 grams IV in divided  doses, Phenergan 12.5 mg diluted slow IV push to augment conscious  sedation.  Cetacaine spray for topical pharyngeal anesthesia.   FINDINGS:  EGD examination of tubular esophagus revealed no mucosal  abnormalities.  EG junction easily traversed.  Stomach:  Gastric cavity  was emptied and insufflated well with air.  Thorough examination of  gastric mucosa including retroflexion of proximal stomach and the  esophagogastric junction demonstrated only a tiny hiatal hernia.  Pylorus was patent, easily traversed.  Examination of the bulb and  second portion revealed no abnormalities.   THERAPEUTIC/DIAGNOSTIC MANEUVERS PERFORMED:  Scope was withdrawn.  A 54-  French Maloney dilator was passed full insertion which revealed no  apparent complication related to passage of the dilator.  The patient  tolerated the procedure well and was reactive to endoscopy.   IMPRESSION:  Normal esophagus, very small hiatal hernia otherwise normal  stomach D1, D2 status post passage of a  54-French Maloney dilator.   RECOMMENDATIONS:  Daily acid suppression with omeprazole 20 mg orally  daily.  Followup appointment with [place] in 6 weeks to see how she is doing  in regards to reflux, dysphagia, and constipation.      Jonathon Bellows, M.D.  Electronically Signed    RMR/MEDQ  D:  04/14/2008  T:  04/14/2008  Job:  [id number]   cc:   Madelin Rear. Sherwood Gambler, MD  Fax: [phone]

## 2011-01-22 NOTE — Letter (Signed)
November 28, 2008   Edward L. Juanetta Gosling, MD  [address]  [place]  [place] [address]   Re:  Sharon Gay, Sharon Gay               DOB:  Jan 10, 1966   Dear Renae Fickle,   I appreciated the opportunity to see, the patient, 45 year old patient  has had some shortness of breath and chest discomfort and has had a long  history of sore throat.  Apparently, several years ago, she had a polyp  removed from right vocal cord and she is seeing Dr. Suzanna Obey in the  past.  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  A bronchoscopy was done, which  brushings were done at these areas and these lesions were seen and  apparently were seen in the pharynx and the subglottic area.  She is a  previous smoker.  Her pulmonary function tests show some mild airflow  obstruction.  She has a cough and constant congestion.  Her past medical  history is significant for mitral valve prolapse, migraine headaches.   MEDICATIONS:  Xanax 0.5 b.i.d., Nexium 40 mg daily, Lortab 10-500 daily.   Morphine causes her to have hallucinations.   SOCIAL HISTORY:  Previous smoker  and cotton dust.  Does not drink  alcohol on a regular basis.  She still smokes a half-a-pack of  cigarettes daily.  She is a Scientist, product/process development.  Married.  She also has been  told she has had anxiety attacks.   Family history is noncontributory.   REVIEW OF SYSTEM:  She is 141 pounds.  She is 5 feet 10.  In general,  weight has been stable.  CARDIAC:  She has chest pain, chest tightness,  and shortness of breath.  No angina or atrial fibrillation.  PULMONARY:  No hemoptysis with cough.  GI:  She has some dysphagia, abdominal pain,  constipation.  GU:  No kidney disease, dysuria, or frequent urination.  VASCULAR:  No claudication, DVT, TIA's.  NEUROLOGICAL:  No dizziness or  blackouts, but has headaches.  MUSCULOSKELETAL:  He has joint pain  arthritis.  PSYCHIATRIC:  She has been treated for nervousness.  EYES/ENT:  She has some recent decrease in hearing.  HEMATOLOGIC:  No  problems with bleeding, clotting disorders.   PHYSICAL EXAMINATION:  GENERAL:  She is a well-developed Caucasian  female, in no acute distress.  HEAD, EYES, EARS, NOSE AND THROAT:  Unremarkable.  Uvula is in the  midline.  NECK:  Supple without thyromegaly.  There is no supraclavicular or  axillary adenopathy and no tenderness.CHEST:  Clear to auscultation and  percussion.  HEART:  Regular sinus rhythm.  No murmurs.  ABDOMEN:  Soft.  There is no hepatosplenomegaly.  EXTREMITIES:  Pulses are 2+.  There is no clubbing or edema.  NEUROLOGIC:  She is oriented x3.  Sensory and motor intact.  Cranial  nerves intact.  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  I have discussed the case with Dr. Suzanna Obey.  We will plan to do a direct laryngoscopy and bronchoscopy on the  29th, and I will do a laser ablation of some of these lesions if that is  what needs to be done at that time.  She understands the risk and agrees  to the surgery.   Dorinda Hill  Delrae Alfred, M.D.  Electronically Signed   DPB/MEDQ  D:  11/28/2008  T:  11/29/2008  Job:  [id number]   cc:   Suzanna Obey, M.D.

## 2011-01-22 NOTE — Consult Note (Signed)
Sharon Gay, Sharon Gay               ACCOUNT NO.:  [id number]   MEDICAL RECORD NO.:  [id number]          PATIENT TYPE:  AMB   LOCATION:  DAY                           FACILITY:  [facility]   PHYSICIAN:  R. Roetta Sessions, M.D. DATE OF BIRTH:  01/16/1966   DATE OF CONSULTATION:  03/29/2008  DATE OF DISCHARGE:                                 CONSULTATION   REASON FOR REFERRAL:  1. Worsened gastroesophageal reflux disease.  2. Dysphagia.  3. Constipation.   HISTORY OF PRESENT ILLNESS:  [name] is a pleasant 45 year old  Caucasian female sent over courtesy by Dr. Elfredia Nevins, to further  evaluate chronic constipation and worsening chronic gastroesophageal  reflux disease and recurrent esophageal dysphagia.   [name] relates chronic constipation of year's duration.  She may go  3 weeks without a bowel movement.  She has to take sometimes 4 Dulcolax  tablets on Friday to have a bowel movement sometime over the weekend.  If she does not do this, she will not basically go for weeks.  She has  tried a number of agents previously Metamucil, Citrucel, magnesium  citrate, Activia, MiraLax without much impact on bowel function.  She  only occasionally gets the urge to have a bowel movement.  She has not  passed any blood per rectum.  There is no family history of colorectal  neoplasia  She had a colonoscopy in 2006, had some tiny benign polyp  which were removed by me.  She also has a history of gastroesophageal  reflux disease and is supposed to be on a proton pump inhibitor, but is  certainly on no acid suppression aside from over-the-counter H2  blockers.  She now has recurrent esophageal dysphagia, previously  underwent esophageal dilation (Schatzki's ring).  She does not consume  alcohol.  She does not use any NSAIDs.  She does smoke half pack of  cigarettes per day.   PAST MEDICAL HISTORY:  Significant for,  1. Anxiety.  2. Neurosis.  3. Gastroesophageal reflux disease.  4.  History of mitral valve prolapse but no history of endocarditis.   PAST SURGERIES:  Vocal cord polyp removed, cardiac catheterization in  March 2009 which turned out okay, colonoscopy in 2006, EGD a couple of  years earlier as described above.   CURRENT MEDICATIONS:  1. Xanax 1 mg one-half tablet b.i.d.  2. Lortab p.r.n. migraines.  3. Acid reliever OTC.  4. Tylenol p.r.n.   ALLERGIES:  PENICILLIN.   FAMILY HISTORY:  Negative chronic GI or liver illness.   SOCIAL HISTORY:  The patient is married, has 2 stepchildren.  She works  for the [employer]  She smokes one-half pack of cigarettes per  day.  No alcohol or illicit drugs.   REVIEW OF SYSTEMS:  No chest pain, no dyspnea on exertion, no change in  weight, no fever or chills.   PHYSICAL EXAMINATION:  GENERAL:  Pleasant 45 year old lady resting  comfortably.  VITAL SIGNS:  Weight 146, height 5 feet 9 inches, temperature 97.9, BP  100/70, and pulse 60.  SKIN:  Warm and dry.  There is  no jaundice __________ chronic liver  disease.  HEENT:  No scleral icterus.  Conjunctivae pink.  CHEST:  Lungs are clear to auscultation.  CARDIAC:  Regular rate and rhythm without murmur, gallop, or rub.  ABDOMEN:  Nondistended.  Positive bowel sounds.  Soft and nontender  without appreciable mass or organomegaly.  EXTREMITIES:  No edema.   IMPRESSION:  [name] is a pleasant 45 year old lady with chronic  constipation sounds more like colonic inertia than anything else to me.  She has failed multitude of agents to facilitate bowel function.  She  has not been tried on Amitiza.  She also has esophageal dysphagia to  solids and viral gastroesophageal reflux disease symptoms.   RECOMMENDATIONS:  We will start her on some Amitiza 8 mcg b.i.d. with  meals.  Emphasized the importance of taking with breakfast and supper.  Side effects of headache, diarrhea, and nausea were reviewed.  I have  given her enough for a month's more therapy.  We  will proceed with EGD  with esophageal dilation as appropriate in the near future.  Risks,  benefits, alternatives, and limitations were reviewed, questions  answered.  She in all likelihood needs to get back on acid suppression  regimen.  She may need her ring dilated.  She has mitral valve prolapse.  This is no longer an indication for antibiotic prophylaxis for  endoscopy, so therefore, we will not give her any for this procedure.  We will make further suggestions in the very near future.   I would like to thank Dr. Elfredia Nevins for allowing me to see this  nice lady once again in consultation.      Jonathon Bellows, M.D.  Electronically Signed     RMR/MEDQ  D:  03/29/2008  T:  03/30/2008  Job:  [id number]   cc:   Madelin Rear. [place]wood Gambler, MD  Fax: [phone]

## 2011-01-22 NOTE — Cardiovascular Report (Signed)
NAMETKEYA, STENCIL               ACCOUNT NO.:  [id number]   MEDICAL RECORD NO.:  [id number]          PATIENT TYPE:  INP   LOCATION:  6707                         FACILITY:  [facility]   PHYSICIAN:  Madaline Savage, M.D.DATE OF BIRTH:  Jul 31, 1966   DATE OF PROCEDURE:  11/18/2007  DATE OF DISCHARGE:  11/18/2007                            CARDIAC CATHETERIZATION   PROCEDURES PERFORMED:  1. Combined left heart catheterization without percutaneous      intervention.  2. AngioSeal closure device of the right femoral artery.   COMPLICATIONS:  None.   ENTRY SITE:  Right femoral.   DYE USED:  Omnipaque.   CATHETERS USED:  5-French.   CLOSURE DEVICE:  AngioSeal, successful.   PATIENT PROFILE:  The patient is a 45 year old tobacco smoker who has  seen Dr. Nanetta Batty in the past and entered the [facility] Emergency  Room today.  She was actually transferred to [facility] after being seen  in one of our satellite offices and it was felt that catheterization was  desirable.  Combined left heart catheterization was performed by the  right percutaneous femoral approach using 5-French catheters and no  complications occurred.   RESULTS:  1. Angiographically patent coronary arteries.  2. Normal LV systolic function.  3. Atypical chest pain.  4. Chronic tobacco use.   PLAN:  The patient will be discharged from either Short Stay at [facility] or [address] after a 2 hour recovery.  She will need to follow up  with Dr. Allyson Sabal on November 24, 2007.  She wants to stop smoking and Chantix  is going to be prescribed for her.           ______________________________  Madaline Savage, M.D.     WHG/MEDQ  D:  11/18/2007  T:  11/19/2007  Job:  [id number]   cc:   Nanetta Batty, M.D.  [facility] Cath Lab

## 2011-01-25 NOTE — Procedures (Signed)
[facility]  Patient:    Sharon Gay, Sharon Gay Visit Number: [id number] MRN: [id number]          Service Type: OUT Location: RAD Attending Physician:  Fredirick Maudlin Dictated by:   Kari Baars, M.D. Proc. Date: 11/26/01 Admit Date:  11/20/2001 Discharge Date: 11/20/2001   CC:         Elfredia Nevins, M.D.  Karleen Hampshire, M.D.  Patrica Duel, M.D.  Colette Ribas, M.D.   Stress Test  PROCEDURE:  Stress test  CARDIOLOGIST:  Kari Baars, M.D.  INDICATIONS:  Ms. Toves is undergoing a graded exercise testing because of chest pain and shortness of breath, of unknown cause.  She has had multiple bouts of bronchitis but continues to have difficulties, and no etiology has been found.  DESCRIPTION OF PROCEDURE:  Ms. Teachey exercised for 10 minutes and 30 seconds on the Bruce protocol, reaching and sustaining 12.9 METS.  Her maximum recorded heart rate was 133, which is 72% of her age-predicted maximal heart rate, adequate with beta blockade.  She complained of shortness of breath and chest discomfort throughout the entire test, but had no electrocardiographic changes suggestive of inducible ischemia.  Her blood pressure response to exercise was normal.  No other symptoms were seen during exercise.  IMPRESSION 1. Good exercise tolerance. 2. No evidence of inducible ischemia. 3. Chest discomfort and shortness of breath with exercise, but not    associated with any electrocardiographic changes. 4. Normal blood pressure response to exercise. Dictated by:   Kari Baars, M.D. Attending Physician:  Fredirick Maudlin DD:  11/26/01 TD:  11/27/01 Job: [id number]

## 2011-01-25 NOTE — H&P (Signed)
NAMELILLIAN, BALLESTER               ACCOUNT NO.:  [id number]   MEDICAL RECORD NO.:  [id number]          PATIENT TYPE:  INP   LOCATION:  A314                          FACILITY:  [facility]   PHYSICIAN:  R. Roetta Sessions, M.D. DATE OF BIRTH:  Jul 15, 1966   DATE OF ADMISSION:  08/18/2004  DATE OF DISCHARGE:  12/13/2005LH                                HISTORY & PHYSICAL   CHIEF COMPLAINT:  Colonoscopy, follow-up hospitalization.   HISTORY OF PRESENT ILLNESS:  Ms. Yanes is a 45 year old Caucasian female  who was recently admitted to [facility] on August 19, 2004.  She  had sudden onset of severe cramps, nausea, and vomiting.  She also had  several loose stools.  She was noted to have leukocytosis with a shift to  the left.  She had a abdominal-pelvic CT, which showed a thickened ascending  colon and hepatic flexure as well as transverse colon and splenic area.  She  also had a scant amount of free fluid in the pelvic cavity.  She was  admitted for what was felt to be colitis.  Since discharge she has been  treated with Flagyl 250 mg q.i.d.  She has also taken Vancocin HCl 500 mg  b.i.d. for the last five days.  She continues to have crampy abdominal pain  to bilateral lower quadrants.  Diarrhea has resolved, although she notes she  only had three episodes of diarrhea.  Her main problem is chronic  constipation, which she has had for years.  She typically goes weeks without  a bowel movement.  She has not had a bowel movement since December 13, per  her report.  She also noticed rectal bleeding with prior hospitalization as  well.  She denies any fever.  She does have chills.  She does use an  occasional laxative.  She has been tried on multiple medications for chronic  constipation, including MiraLax, Citrucel, and stool softeners and Zelnorm.   PAST MEDICAL HISTORY:  1.  Colonoscopy by Dr. Jena Gauss on April 23, 2001, which was incomplete      secondary to poor prep. This was  followed by a normal barium enema on      April 28, 2001.  2.  Chronic GERD.  EGD in July 2003 showed a noncritical ring and a small      sliding hiatal hernia.  Esophagus was dilated with a 54 French      ballooning dilator without mucosal disruption.  3.  She has constipation-predominant IBS.  4.  She had a nodule removed from her vocal cord a couple of years ago.  5.  History of anxiety and panic attacks.   CURRENT MEDICATIONS:  1.  Xanax 1 mg t.i.d.  2.  Prevacid 30 mg daily.  3.  Vancocin HCl 500 mg b.i.d.  4.  Flagyl 250 mg q.i.d.  5.  Hydrochlorothiazide 25 mg p.r.n.  6.  Phenergan 25 mg p.r.n.  7.  Floranex Chewable t.i.d.  8.  Hydrocodone 5/500 mg p.r.n.   ALLERGIES:  PENICILLIN and MORPHINE, both of which make her feel funny.   FAMILY  HISTORY:  Noncontributory.  Father deceased secondary to MI in his  25s.  Mother is in fair health.  Has eight siblings, who are relatively  healthy.   SOCIAL HISTORY:  Ms. Corl is married.  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  She has two  stepchildren.  She has been a smoker for several years, using less than a  pack a day.  She denies any alcohol or drug use.   PHYSICAL EXAMINATION:  VITAL SIGNS:  Weight 151.5 pounds, blood pressure  108/70, pulse 68.  GENERAL:  Ms. Stipp is a well-developed, well-nourished 45 year old  Caucasian female who is alert and oriented and pleasant and cooperative, in  no acute distress.  HEENT:  Sclerae are clear and nonicteric.  Conjunctivae pink.  Oropharynx  pink and moist without any lesions.  NECK:  Supple without any mass or thyromegaly.  CHEST:  Heart regular rate and rhythm with normal S1, S2, without murmur,  clicks, rubs, or gallops.  CHEST:  Lungs clear to auscultation bilaterally.  ABDOMEN:  Positive bowel sounds x4.  No bruits auscultated.  Soft,  nontender, nondistended.  Symmetrical without any palpable mass or   hepatosplenomegaly.  No rebound tenderness or guarding.  RECTAL:  Deferred.  EXTREMITIES:  Trace edema bilaterally.  2+ pedal pulses.  SKIN:  Pink, warm and dry without any rash or jaundice.   Laboratory studies from hospitalization on August 21, 2004, reveal a  hemoglobin of 11.3, hematocrit of 32.9, with an MCV of 95.7, WBC is 7.7.  Sodium 132, potassium 3.3, otherwise electrolytes and LFTs are normal.   IMPRESSION:  Ms. Leonhart is a 45 year old Caucasian female who was recently  hospitalized around December 11 for what was presumed to be antibiotic-  induced colitis.  Clostridium difficile assay was negative.  She has been  treated with Flagyl as well as Vancocin at this point.  Her diarrhea has  resolved.  She is doing quite well from this standpoint.  Would recommend  further evaluation, however, with colonoscopy given this acute flare as well  as her history of chronic constipation.  Exam previous attempt was  suboptimal prep and she notes that she did not have good results with prep.  Further exam is necessary to rule out inflammatory bowel disease.   She also had hypokalemia while hospitalized.  We should check this to be  sure this has been corrected.  She also had a mild anemia as well.   RECOMMENDATIONS:  1.  Lab studies to include CBC, sedimentation rate, CMP, and TSH.  2.  Will schedule colonoscopy with Dr. Jena Gauss in the near future.  I have      discussed the procedure including risks and benefits, to include      relative to bleeding, infection, perforation, and drug reaction.  She      agrees, and informed consent will be obtained.  I have asked her to      contact our office if she has minimal results with colonoscopy prep as      she had in the past.  3.  She is to complete her Vancocin and Flagyl.  4.  She can continue Floranex as directed.  5.  Work note was given until after colonoscopy.  6.  Further recommendations pending colonoscopy.     Kand  KC/MEDQ   D:  09/05/2004  T:  09/05/2004  Job:  [id number]   cc:  Madelin Rear. Sherwood Gambler, MD  [address]  [place]  [place] [address]  Fax: [phone]

## 2011-01-25 NOTE — Discharge Summary (Signed)
Sharon Gay, Sharon Gay               ACCOUNT NO.:  [id number]   MEDICAL RECORD NO.:  [id number]          PATIENT TYPE:  OBV   LOCATION:  6707                         FACILITY:  [facility]   PHYSICIAN:  Dani Gobble, MD       DATE OF BIRTH:  07/24/1966   DATE OF ADMISSION:  11/18/2007  DATE OF DISCHARGE:  11/18/2007                               DISCHARGE SUMMARY   DISCHARGE DIAGNOSES:  1. Chest pain consistent with angina with positive risk factors,      catheterization this admission revealing normal coronaries  2. Smoking.  3. Dyslipidemia.   HOSPITAL COURSE:  The patient is a 45 year old female who was referred  to [place] for evaluation of accelerated angina.  She has risk factors as  noted above.  She was seen by Dr. Domingo Sep remotely and at that time was  felt to have atypical pain.  She now describes more typical symptoms  with substernal chest pain radiating to her neck and left arm associated  with shortness of breath and diaphoresis.  She was seen in [facility] ER  the previous night.  She signed out AMA, although they recommended  admission.  She was seen by Dr. Domingo Sep on November 18, 2007, and is  admitted now for diagnostic catheterization.  Catheterization was done  November 18, 2007, by Dr. Elsie Lincoln which revealed normal coronaries, normal  LV function.  She tolerated this well.  We suggested Chantix.  She will  follow up with Dr. Domingo Sep as an outpatient.   LABORATORY DATA:  EKG shows sinus rhythm, no acute changes.   Hemoglobin 13.5, hematocrit 38.2, platelets 253.  INR is 1.  Sodium 136,  potassium 3.5, BUN 8, creatinine 0.88.   DISPOSITION:  The patient is discharged in stable condition and will  follow up in [place] with Dr. Domingo Sep.      Abelino Derrick, P.A.    ______________________________  Dani Gobble, MD    LKK/MEDQ  D:  11/26/2007  T:  11/26/2007  Job:  [id number]   cc:   Madelin Rear. Sherwood Gambler, MD

## 2011-01-25 NOTE — Procedures (Signed)
NAME:  Sharon Gay, Sharon Gay                         ACCOUNT NO.:  [id number]   MEDICAL RECORD NO.:  [id number]                   PATIENT TYPE:  OUT   LOCATION:  RESP                                 FACILITY:  [facility]   PHYSICIAN:  Edward L. Juanetta Gosling, M.D.             DATE OF BIRTH:  1965-09-16   DATE OF PROCEDURE:  DATE OF DISCHARGE:  11/08/2003                              PULMONARY FUNCTION TEST   1. Spirometry is normal.  2. Lung volumes show no evidence of restrictive change but do show some air     trapping.  3. DLCO is mildly reduced.   In comparison with testing of November 23, 2001, there has been little change.      ___________________________________________                                            Oneal Deputy. Juanetta Gosling, M.D.   ELH/MEDQ  D:  11/15/2003  T:  11/15/2003  Job:  [id number]   cc:   [facility]

## 2011-01-25 NOTE — Assessment & Plan Note (Signed)
OFFICE VISIT   Sharon Gay, Sharon Gay  DOB:  [birth year]                                        Jan 16, 2009  CHART #:  [id number]   We received her report from her bronchial washing that she had.  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  I did  inform Dr. Juanetta Gosling of this, and we will send him the final report when  he arrives.   Ines Bloomer, M.D.  Electronically Signed   DPB/MEDQ  D:  01/16/2009  T:  01/17/2009  Job:  [id number]

## 2011-01-29 NOTE — Op Note (Signed)
Sharon Gay, Sharon Gay               ACCOUNT NO.:  [id number]   MEDICAL RECORD NO.:  [id number]          PATIENT TYPE:  AMB   LOCATION:  DAY                           FACILITY:  [facility]   PHYSICIAN:  R. Roetta Sessions, M.D. DATE OF BIRTH:  10-31-1965   DATE OF PROCEDURE:  09/12/2004  DATE OF DISCHARGE:                                 OPERATIVE REPORT   PROCEDURE:  Colonoscopy and ileoscopy.   INDICATIONS:  The patient is a 45 year old lady with chronic constipation  who recently had some hematochezia in the setting of diarrhea and the  setting of taking clindamycin.  She is suspected of having Clostridium  difficile associated colitis.  Those symptoms have resolved and she is back  to her baseline having a bowel movement one every four to five weeks.  Colonoscopy is now being done to further evaluate hematochezia.  This  approach has been discussed with the patient at length.  The potential  risks, benefits and alternatives have been reviewed.  Questions answered.   DESCRIPTION OF PROCEDURE:  Oxygen saturation, blood pressure, pulse and  respiration were monitored throughout the entire procedure.  Conscious  sedation with Versed 6 mg IV and Demerol 150 mg IV in divided doses.  The  instrument was the Olympus video chip system.   FINDINGS:  Digital rectal exam initially revealed no abnormalities.   ENDOSCOPIC FINDINGS:  Prep was good.   Rectum:  Examination of the rectal mucosa including retroflexion in the anal  verge revealed no abnormalities.   Colon:  Colonic mucosa was surveyed from the rectosigmoid junction to the  left, transverse,  right colon to the area of the appendiceal orifice,  ileocecal valve and cecum.  These structures were well seen and photographed  for the record.  The terminal ileum was intubated to 10 cm.  From the level  the scope was slowly withdrawn.  All previously mentioned mucosal surfaces  were again seen.  The terminal ileum mucosa appeared normal.  The  patient  had three 3 mm diminutive polyps in the area of 40 (mid descending colon)  which were cold biopsied/removed.  The remainder of the colonic mucosa  appeared normal as did the rectum as stated above.  The patient tolerated  the procedure well and was reactive after endoscopy.   IMPRESSION:  1.  Normal rectum.  2.  Three diminutive polyps mid descending colon, removed with a cold biopsy      technique.  Otherwise normal colon and normal terminal ileum.   I suspect the patient did indeed suffer a bout of antibiotic-associated  colitis recently.  She has significant constipation, lifelong, consistent  with constipation-prone irritable bowel syndrome.   RECOMMENDATIONS:  1.  Begin Zelnorm at a dose of 6 mg orally b.i.d.  2.  Back on MiraLax 70 g orally at bedtime p.r.n. constipation.  I think a      reasonable goal in this situation would be to strive for two to three      bowel movements weekly (two would be a marked improvement).  3.  Follow up on pathology.  4.  Plan to see this nice lady back in the office in six weeks.     [name]   RMR/MEDQ  D:  09/12/2004  T:  09/12/2004  Job:  [id number]   cc:   [facility]

## 2011-01-29 NOTE — Op Note (Signed)
[facility]  Patient:    Sharon Gay, Sharon Gay Visit Number: [id number] MRN: [id number]          Service Type: END Location: DAY Attending Physician:  Jonathon Bellows Dictated by:   Roetta Sessions, M.D. Proc. Date: 03/23/02 Admit Date:  03/23/2002 Discharge Date: 03/23/2002   CC:         Elfredia Nevins, M.D.  Roetta Sessions, M.D.   Operative Report  INDICATIONS FOR PROCEDURE:  The patient is a 45 year old lady with some symptoms consistent with globus hystericus and esophageal dysphagia to solids and liguids.  She has longstanding gastroesophageal reflux symptoms mainly treated with aciphex 200 mg orally daily.  She comes for EGD.  This approach has been discussed with the patient previously and again at the bedside. Potential risks, benefits and alternatives have been reviewed, questions answered.  Of note, she has chronic constipation.  We started Zelnorm 6 mg orally daily, recently which has been a great help in improving her constipation.  I feel she is low dose for conscious sedation with versed and Demerol.  Please see my dictated history and physical for more information.  PROCEDURE NOTE:  The patient was placed in the left lateral decubitus position.  O2 saturation, blood pressures, pulses, respirations were monitored throughout the entirety of the procedure.  ANESTHESIA:  Conscious sedation versed 6 mg IV, Demerol 150 mg IV, in divided doses, cetacaine spray for topical pharyngeal analgesia.  INSTRUMENTS:  Olympus video chip gastroscope.  EGD FINDINGS:  Examination of the tubular esophagus revealed non critical appearing Schatzkis ring.  The mucosal otherwise appeared normal. EG junction was easily traversed.  STOMACH:  The gastric cavity was emptied and insufflated well with air for thorough examination of the gastric mucosa including retroflexed view of the proximal stomach and esophagus and gastric junction demonstrated only a  small hiatal hernia.  The pylorus is patent and easily traversed.  DUODENUM:  The bulb and second portion appeared normal.  THERAPY/DIAGNOSTIC MANEUVERS PERFORMED: 1.  A 54 French Maloney dilator was passed to full insertion with ease     without ______ dilator.  The lymphatic revealed no apparent     complications ______ dilator.  The patient tolerated the procedure well and was prepared for endoscopy.  IMPRESSION: 1. Noncritical appearing Schatzkis ring status post dilation as described    above. 2. The remainder of the esophageal mucosa appeared normal. 3. Small hiatal hernia otherwise normal stomach and duodenum through the    second portion.  RECOMMENDATIONS:  Stop aciphex.  Begin nexium 40 mg orally daily at 30 minutes before breakfast.  Prescription given and patient is advised to go my office for some free samples.  FOLLOWUP:  Will plan to see him as well.  Back in the office in 6 weeks to assess her progress. Dictated by:   Roetta Sessions, M.D. Attending Physician:  Jonathon Bellows DD:  03/23/02 TD:  03/26/02 Job: [id number]

## 2011-01-29 NOTE — Consult Note (Signed)
Sharon Gay, Sharon Gay               ACCOUNT NO.:  [id number]   MEDICAL RECORD NO.:  [id number]          PATIENT TYPE:  INP   LOCATION:  A428                          FACILITY:  [facility]   PHYSICIAN:  Lionel December, M.D.    DATE OF BIRTH:  05-25-1966   DATE OF CONSULTATION:  08/19/2004  DATE OF DISCHARGE:                                   CONSULTATION   REASON FOR CONSULTATION:  Acute colitis.   HISTORY OF PRESENT ILLNESS:  Sharon Gay is a 45 year old Caucasian female who was  admitted to Dr. Edison Simon service earlier today via emergency room, where  she presented last night with more or less sudden onset of severe cramps,  nausea, and she had started vomiting after she was hospitalized.  In the  emergency room she was seen by Dr. Margretta Ditty.  She was noted to have a WBC of  18.7 and a shift to the left.  She had an abdominal and pelvic CT which  showed a thickened ascending colon and hepatic flexure as well as the  transverse colon, splenic area.  She also had a scant amount of free fluid  in the pelvic cavity.  The patient was felt to have colitis and therefore  hospitalized.  She was begun on Zosyn.  The patient stated that she did have  two bowel movements last night.  The stools were loose, and she saw some  blood in them.  Typically she has chronic constipation.  She states she can  go 2 months without a bowel movement.  She has been on all sorts of  medicines including Zelnorm and lactulose, etc.  They only work for a while.  She has seen Dr. Jena Gauss in the past but not recently.  She reports having had  a colonoscopy within the last 2-3 years which was normal, although she  reminds me the prep is not optimal.  She believes she had a colonoscopy at  the time of EGD ED in July, 2003, but I do not see that in the computer.  She states her heartburn is well controlled with therapy.   REVIEW OF SYSTEMS:  Negative for fever, anorexia, weight loss.  She did have  chills yesterday.  She states her  symptoms began at around 4 p.m., and she  had generalized cramps which were excruciating.  She has had cramps  associated with constipation in the past, but nothing like what she had last  night.  She is on clear liquids now, and she is tolerating that well.  The  patient states she had dental or gum infection, and she was on clindamycin  for 2 weeks.  Then she had to go back and take it for another week.  Her  last dose was 2 weeks or so.  Her usual medicines include Prevacid 30 mg  q.a.m. and Xanax 1 mg b.i.d.   MEDICATIONS:  1.  She is now on Xanax 0.5 mg t.i.d.  2.  Dilaudid 3 mg IV q.2 p.r.n.  3.  Zofran 4 mg IV q.3 p.r.n.  4.  Zosyn 3.375 mg IV q.12h.  PAST MEDICAL HISTORY:  1.  Chronic GERD.  She had EGD in July, 2003.  She had a non-critical ring      and a small sliding hernia.  Esophagus was dilated with 54 French      ballooning dilator without mucosa disruption.  2.  Constipation-predominant IBS.  Colonoscopy within the last couple of      years has been negative, although prep was suboptimal.  The patient's      mother has had polyps, and she is very much interested in having this      exam repeated at some point.  3.  She had a nodule removed from the vocal cord a couple of years ago.  4.  A history of anxiety and panic attacks.   ALLERGIES:  Penicillin which makes her feel funny.   FAMILY HISTORY:  Noncontributory.  The father died of an MI in his 58s.  The  mother is in fair health.  She has eight siblings who are all doing fine.   SOCIAL HISTORY:  She is married.  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.  She is married and has two stepchildren.  She has  been smoking for years but less than a pack a day.  She does not drink  alcohol.   PHYSICAL EXAMINATION:  GENERAL:  A well-developed, well-nourished Caucasian  female who is slightly drowsy from the medication but able to answer  questions.  Admission weight is not available.  VITAL  SIGNS:  Pulse 68 per minute, blood pressure 111/51, respiratory rate  18, temperature 97.4.  HEENT:  Conjunctivae are pink.  Sclerae are nonicteric.  Oropharyngeal  mucosa is normal.  NECK:  Without masses or thyromegaly.  CARDIAC:  Regular rhythm, normal S1 and S2.  No murmur or gallop noted.  LUNGS:  Clear to auscultation.  ABDOMEN:  Symmetrical.  Bowel sounds are hyperactive.  Palpation reveals  generalized tenderness which is mild to moderate but more so in the  hypogastric area.  No organomegaly or masses noted.  RECTAL:  Reveals liquid stool in the vault which is guaiac positive.  She  does not have peripheral edema or clubbing.   LABS ON ADMISSION:  WBC 18.7, H&H 13.2 and 38.8, platelet count 275,000.  She had 87 segs.  Sodium 132, potassium 3.3, chloride 104, CO2 22, chloride  99, BUN 10, creatinine 0.8, bilirubin 0.5, AP 51, AST 20, ALT 70, total  protein 6 with albumin of 3.8, calcium 8.7.  Lipase 24.  Urine specific  gravity was greater than 1.030.  The WBC from this morning is 12.1.   ASSESSMENT:  Sharon Gay is a 45 year old Caucasian female who presents with acute  onset of severe abdominal cramps associated with nausea, vomiting and two  loose stools mixed with blood.  On admission, she had leukocytosis.  She has  thickened ascending colon as well as hepatic flexure distress, and a scant  amount of fluid in the pelvic cavity.  She is afebrile.  She is tolerating  clear liquids.  The history is pertinent for clindamycin course for dental  infection (2 weeks followed by an additional 1 week).  Given this scenario,  I suspect we are dealing with antibiotic-induced colitis.  Other infections  are inflammatory bowel disease which can also present like this.   RECOMMENDATIONS:  1.  Zosyn will be discontinued.  We will start on metronidazole 250 mg p.o.      q.i.d.  2.  Diet  will be advanced to full liquid along with yogurt with each meal. 3.  Levsin SL before each meal.  4.   Acidophilus chewable 1 t.i.d.  5.  Stool studies will be obtained for WBC, C. diff toxin titer, O&P and      culture.  6.  If stool studies are negative and the patient does not improve over the      next 48 hours or so, would consider a colonoscopy.  Otherwise she could      have a colonoscopy at a later date when she has recovered from acute      illness.   We would like to thank Dr. Regino Schultze for the opportunity to participate in the  care of this nice lady.    Naje  NR/MEDQ  D:  08/19/2004  T:  08/19/2004  Job:  [id number]

## 2011-01-29 NOTE — Procedures (Signed)
[facility]  Patient:    Sharon Gay, Sharon Gay Visit Number: [id number] MRN: [id number]          Service Type: OUT Location: RAD Attending Physician:  Fredirick Maudlin Dictated by:   Kari Baars, M.D. Proc. Date: 11/23/01 Admit Date:  11/20/2001 Discharge Date: 11/20/2001                      Pulmonary Function Test Inter.  RESULTS: 1. Spirometry is normal. 2. Lung volumes show borderline restrictive change and evidence of air    trapping. 3. DLCO minimally reduced. Dictated by:   Kari Baars, M.D. Attending Physician:  Fredirick Maudlin DD:  11/23/01 TD:  11/24/01 Job: [id number]

## 2011-01-29 NOTE — H&P (Signed)
Sharon Gay, Sharon Gay               ACCOUNT NO.:  [id number]   MEDICAL RECORD NO.:  [id number]          PATIENT TYPE:  INP   LOCATION:  A428                          FACILITY:  [facility]   PHYSICIAN:  Kirk Ruths, M.D.DATE OF BIRTH:  27-May-1966   DATE OF ADMISSION:  08/18/2004  DATE OF DISCHARGE:  LH                                HISTORY & PHYSICAL   CHIEF COMPLAINT:  Abdominal pain.   HISTORY OF PRESENT ILLNESS:  This is a 45 year old female with a history of  irritable bowel syndrome who has been seen by myself as well as Dr. Jena Gauss in  the past.  The patient states she was in her usual state of good health  until the day of admission when she had the sudden onset of generalized  abdominal pain subsequently associated with some watery and bloody diarrhea.  She was evaluated in the emergency room where CT scan was suggestive of a  few colitis.  She is admitted for pain control, blood counts, generalized  status, GI consult, IV antibiotics.   PAST MEDICAL HISTORY:  She has history of migraine headaches, constipation  associated with irritable bowel.   MEDICATIONS:  Include Xanax and Zelnorm as well as hydrocodone.   PAST SURGICAL HISTORY:  The patient is status post sinus surgery.   ALLERGIES:  MORPHINE and PENICILLIN.   REVIEW OF SYSTEMS:  Denies chest pain, nausea, vomiting.   PHYSICAL EXAMINATION:  GENERAL:  Middle-aged female in no severe distress.  VITAL SIGNS:  She is afebrile. Blood pressure 120/70, respirations 20 and  unlabored, pulse 90 and regular.  HEENT:  TMs normal.  Pupils equal, round, and reactive to light and  accommodation.  Oropharynx benign.  NECK:  Supple without JVD, bruit, or thyromegaly.  LUNGS:  Clear in all areas.  HEART:  Regular sinus rhythm without murmur, gallop, or rub.  ABDOMEN:  Soft with moderate diffuse tenderness, no rebound.  There are  diminished bowel sounds.  EXTREMITIES:  Without clubbing, cyanosis, or edema.  NEUROLOGIC:  Grossly  intact.   IMPRESSION:  Acute colitis.     Will   WMM/MEDQ  D:  08/19/2004  T:  08/19/2004  Job:  [id number]

## 2011-01-29 NOTE — Discharge Summary (Signed)
Sharon Gay, Sharon Gay               ACCOUNT NO.:  [id number]   MEDICAL RECORD NO.:  [id number]          PATIENT TYPE:  INP   LOCATION:  A314                          FACILITY:  [facility]   PHYSICIAN:  Kirk Ruths, M.D.DATE OF BIRTH:  1965/10/30   DATE OF ADMISSION:  08/18/2004  DATE OF DISCHARGE:  12/13/2005LH                                 DISCHARGE SUMMARY   DISCHARGE DIAGNOSES:  1.  Colitis.  2.  History of migraine headaches.  3.  History of constipation associated with irritable bowel syndrome.   HOSPITAL COURSE:  This 45 year old female was admitted through the emergency  room after complaining of severe generalized abdominal pain with some bloody  diarrhea.  CT scan on admission showed colitis of the right colon and part  of the transverse colon.  Initial white count was 18,000 with hemoglobin  13.2.  The patient's urinalysis was negative and her liver function tests  were normal.  The patient was admitted to the floor and begun on IV Zosyn  and pain medications.  Her hemoglobin improved to 12.1 the following day  with a hemoglobin of 12.7.  The patient's potassium was 3.3.  The patient  was seen by Dr. Karilyn Cota for GI who discovered she had taken Cleocin for  approximately 3 weeks by a dentist recently and felt that C. difficile was  the cause of her colitis.  Subsequent stool studies were negative including  C. difficile as well as routine cultures and white cells.  The patient was  feeling much better the day of discharge.  She denies nausea or vomiting and  is hungry.  She has improved on Flagyl.  The patient will be discharged home  and followed by GI who will need to do a colonoscopy in the future.   DISCHARGE MEDICATIONS:  1.  Vicodin for pain.  2.  Flagyl 250 mg q.i.d. x1 week.  3.  Yogurt with each meal.     Will   WMM/MEDQ  D:  08/21/2004  T:  08/21/2004  Job:  [id number]

## 2011-06-03 LAB — BASIC METABOLIC PANEL
CO2: 25
Calcium: 9.1
GFR calc Af Amer: >60
GFR calc non Af Amer: >60
Sodium: 136

## 2011-06-03 LAB — DIFFERENTIAL
Lymphocytes Relative: 43
Lymphs Abs: 3.5
Monocytes Absolute: 0.4
Monocytes Relative: 5
Neutro Abs: 3.9

## 2011-06-03 LAB — CBC
Hemoglobin: 13.5
RBC: 3.98
WBC: 8

## 2011-06-03 LAB — POCT CARDIAC MARKERS
CKMB, poc: 1.9
CKMB, poc: 2
Myoglobin, poc: 58.3
Myoglobin, poc: 68.8
Troponin i, poc: <0.05

## 2011-06-03 LAB — APTT: aPTT: 32

## 2011-06-03 LAB — TSH: TSH: 1.439

## 2011-06-03 LAB — PROTIME-INR: INR: 1

## 2011-07-05 ENCOUNTER — Other Ambulatory Visit (HOSPITAL_COMMUNITY): Payer: Self-pay | Admitting: Internal Medicine

## 2011-07-05 DIAGNOSIS — Z139 Encounter for screening, unspecified: Secondary | ICD-10-CM

## 2011-07-09 ENCOUNTER — Ambulatory Visit (HOSPITAL_COMMUNITY): Payer: Self-pay

## 2011-07-22 ENCOUNTER — Ambulatory Visit (HOSPITAL_COMMUNITY): Payer: Self-pay

## 2011-07-29 ENCOUNTER — Ambulatory Visit (HOSPITAL_COMMUNITY)
Admission: RE | Admit: 2011-07-29 | Discharge: 2011-07-29 | Disposition: A | Discharge status: Home / Self Care | Payer: Medicaid Other | Source: Ambulatory Visit | Attending: Internal Medicine | Admitting: Internal Medicine

## 2011-07-29 DIAGNOSIS — Z139 Encounter for screening, unspecified: Secondary | ICD-10-CM

## 2011-07-29 DIAGNOSIS — Z1231 Encounter for screening mammogram for malignant neoplasm of breast: Secondary | ICD-10-CM | POA: Insufficient documentation

## 2012-07-31 ENCOUNTER — Other Ambulatory Visit (HOSPITAL_COMMUNITY): Payer: Self-pay | Admitting: Internal Medicine

## 2012-07-31 DIAGNOSIS — Z0000 Encounter for general adult medical examination without abnormal findings: Secondary | ICD-10-CM

## 2012-08-03 ENCOUNTER — Ambulatory Visit (HOSPITAL_COMMUNITY): Payer: Medicaid Other

## 2012-08-05 ENCOUNTER — Institutional Professional Consult (permissible substitution): Payer: Medicaid Other | Admitting: Internal Medicine

## 2012-08-10 ENCOUNTER — Encounter: Payer: Self-pay | Admitting: *Deleted

## 2012-08-10 DIAGNOSIS — R079 Chest pain, unspecified: Secondary | ICD-10-CM

## 2012-08-12 ENCOUNTER — Ambulatory Visit: Payer: Medicaid Other | Admitting: Cardiovascular Disease

## 2012-08-17 ENCOUNTER — Other Ambulatory Visit: Payer: Self-pay | Admitting: Adult Health

## 2012-08-17 ENCOUNTER — Other Ambulatory Visit (HOSPITAL_COMMUNITY)
Admission: RE | Admit: 2012-08-17 | Discharge: 2012-08-17 | Disposition: A | Discharge status: Home / Self Care | Payer: Self-pay | Source: Ambulatory Visit | Attending: Obstetrics and Gynecology | Admitting: Obstetrics and Gynecology

## 2012-08-17 DIAGNOSIS — E049 Nontoxic goiter, unspecified: Secondary | ICD-10-CM

## 2012-08-17 DIAGNOSIS — Z01419 Encounter for gynecological examination (general) (routine) without abnormal findings: Secondary | ICD-10-CM | POA: Insufficient documentation

## 2012-08-17 DIAGNOSIS — Z1151 Encounter for screening for human papillomavirus (HPV): Secondary | ICD-10-CM | POA: Insufficient documentation

## 2012-08-19 ENCOUNTER — Institutional Professional Consult (permissible substitution): Payer: Medicaid Other | Admitting: Internal Medicine

## 2012-08-19 ENCOUNTER — Ambulatory Visit (HOSPITAL_COMMUNITY)
Admission: RE | Admit: 2012-08-19 | Discharge: 2012-08-19 | Disposition: A | Discharge status: Home / Self Care | Payer: Self-pay | Source: Ambulatory Visit | Attending: Adult Health | Admitting: Adult Health

## 2012-08-19 DIAGNOSIS — E049 Nontoxic goiter, unspecified: Secondary | ICD-10-CM

## 2012-08-19 DIAGNOSIS — E041 Nontoxic single thyroid nodule: Secondary | ICD-10-CM | POA: Insufficient documentation

## 2012-08-24 ENCOUNTER — Ambulatory Visit: Payer: Medicaid Other | Admitting: Cardiology

## 2012-09-16 ENCOUNTER — Institutional Professional Consult (permissible substitution): Payer: Medicaid Other | Admitting: Internal Medicine

## 2012-09-17 ENCOUNTER — Ambulatory Visit (INDEPENDENT_AMBULATORY_CARE_PROVIDER_SITE_OTHER): Payer: Medicaid Other | Admitting: Otolaryngology

## 2012-10-02 ENCOUNTER — Encounter: Payer: Self-pay | Admitting: Internal Medicine

## 2012-10-02 ENCOUNTER — Ambulatory Visit (INDEPENDENT_AMBULATORY_CARE_PROVIDER_SITE_OTHER)
Admission: RE | Admit: 2012-10-02 | Discharge: 2012-10-02 | Disposition: A | Discharge status: Home / Self Care | Payer: Self-pay | Source: Ambulatory Visit | Attending: Internal Medicine | Admitting: Internal Medicine

## 2012-10-02 ENCOUNTER — Ambulatory Visit (INDEPENDENT_AMBULATORY_CARE_PROVIDER_SITE_OTHER): Payer: Self-pay | Admitting: Internal Medicine

## 2012-10-02 VITALS — BP 104/78 | HR 80 | Temp 98.8°F | Ht 68.0 in | Wt 155.6 lb

## 2012-10-02 DIAGNOSIS — R0989 Other specified symptoms and signs involving the circulatory and respiratory systems: Secondary | ICD-10-CM

## 2012-10-02 DIAGNOSIS — A310 Pulmonary mycobacterial infection: Secondary | ICD-10-CM

## 2012-10-02 DIAGNOSIS — R079 Chest pain, unspecified: Secondary | ICD-10-CM

## 2012-10-02 DIAGNOSIS — R0600 Dyspnea, unspecified: Secondary | ICD-10-CM

## 2012-10-02 DIAGNOSIS — F17200 Nicotine dependence, unspecified, uncomplicated: Secondary | ICD-10-CM

## 2012-10-02 NOTE — Patient Instructions (Signed)
The key is to stop smoking completely before smoking completely stops you!   Please remember to go to the  x-ray department downstairs for your tests - we will call you with the results when they are available.  GERD (REFLUX)  is an extremely common cause of respiratory symptoms, many times with no significant heartburn at all.    It can be treated with medication, but also with lifestyle changes including avoidance of late meals, excessive alcohol, smoking cessation, and avoid fatty foods, chocolate, peppermint, colas, red wine, and acidic juices such as orange juice.  NO MINT OR MENTHOL PRODUCTS SO NO COUGH DROPS  USE SUGARLESS CANDY INSTEAD (jolley ranchers or Stover's)  NO OIL BASED VITAMINS - use powdered substitutes.

## 2012-10-02 NOTE — Progress Notes (Signed)
  Subjective:    Patient ID: Sharon Gay, female    DOB: [birth year]  MRN: [id number]  HPI  72 yowf smoker with dx of asthma/ MAC followed in 2011 by ID/ [facility] and apparrently a  lost to f/u and referred  10/02/2012 to pulmonary clinic by [facility] for sob/ ? MAI  10/02/2012 1st pulmonary eval cc 3 year hs of progressive sob worse x housework, one block can do HT ok, varies some. Back pain when standing is "throbbing" - no better with saba, no worse with deep breath, present daily and sometimes so severe she gets nauseated with it.  Min cough non productive and does not make the pain worse   Sleeping ok without nocturnal  or early am exacerbation  of respiratory  c/o's or need for noct saba. Also denies any obvious fluctuation of symptoms with weather or environmental changes or other aggravating or alleviating factors except as outlined above    Review of Systems  Constitutional: Negative for fever, chills and unexpected weight change.  HENT: Positive for trouble swallowing. Negative for ear pain, nosebleeds, congestion, sore throat, rhinorrhea, sneezing, dental problem, voice change, postnasal drip and sinus pressure.   Eyes: Negative for visual disturbance.  Respiratory: Positive for cough and shortness of breath. Negative for choking.   Cardiovascular: Positive for chest pain. Negative for leg swelling.  Gastrointestinal: Positive for abdominal pain. Negative for vomiting and diarrhea.  Genitourinary: Negative for difficulty urinating.  Musculoskeletal: Positive for arthralgias.  Skin: Negative for rash.  Neurological: Positive for headaches. Negative for tremors and syncope.  Hematological: Does not bruise/bleed easily.       Objective:   Physical Exam Wt Readings from Last 3 Encounters:  10/02/12 155 lb 9.6 oz (70.58 kg)  02/06/10 147 lb 12.8 oz (67.042 kg)  10/31/09 148 lb 8 oz (67.359 kg)    amb wf  Who failed to answer a single question asked in a straightforward manner,  tending to go off on tangents or answer questions with ambiguous medical terms or diagnoses and seemed perplexed  when asked the same question more than once for clarification.  HEENT: nl dentition, turbinates, and orophanx. Nl external ear canals without cough reflex   NECK :  without JVD/Nodes/TM/ nl carotid upstrokes bilaterally   LUNGS: no acc muscle use, clear to A and P bilaterally without cough on insp or exp maneuvers   CV:  RRR  no s3 or murmur or increase in P2, no edema   ABD:  soft and nontender with nl excursion in the supine position. No bruits or organomegaly, bowel sounds nl  MS:  warm without deformities, calf tenderness, cyanosis or clubbing  SKIN: warm and dry without lesions    NEURO:  alert, approp, no deficits   CXR  10/02/2012 :  No acute cardiopulmonary findings.  Spirometry 10/02/2012  wnl    Assessment & Plan:

## 2012-10-03 NOTE — Assessment & Plan Note (Signed)
No evidence at all of a significant lung infection here, MAI or otherwise.

## 2012-10-03 NOTE — Assessment & Plan Note (Signed)
I reviewed the Flethcher curve with patient that basically indicates  if you quit smoking when your best day FEV1 is still well preserved (which hers clearly is at this point) it is highly unlikely you will progress to severe disease and informed the patient there was no medication on the market that has proven to change the curve or the likelihood of progression.  Therefore stopping smoking and maintaining abstinence is the most important aspect of care, not choice of inhalers or for that matter, doctors.

## 2012-10-03 NOTE — Assessment & Plan Note (Signed)
-   spirometry wnl 10/02/12   Symptoms are markedly disproportionate to objective findings and not clear this is a lung problem but pt does appear to have difficult airway management issues. DDX of  difficult airways managment all start with A and  include Adherence, Ace Inhibitors, Acid Reflux, Active Sinus Disease, Alpha 1 Antitripsin deficiency, Anxiety masquerading as Airways dz,  ABPA,  allergy(esp in young), Aspiration (esp in elderly), Adverse effects of DPI,  Active smokers, plus two Bs  = Bronchiectasis and Beta blocker use..and one C= CHF  ? Acid reflux > reviewed rx  ? Active smoking > discussed separately  ? Anxiety > usually a dx of exclusion here but based on how difficult it was to take her hx should probably be at the top of the list > f/u Dr Sherwood Gambler

## 2012-10-03 NOTE — Assessment & Plan Note (Signed)
Pain is mid to low thoracic ? Vertebral compression fx present for 3 years and not a lung problem per se so referred back to Dr Sherwood Gambler for f/u

## 2012-10-05 ENCOUNTER — Telehealth: Payer: Self-pay | Admitting: Internal Medicine

## 2012-10-05 ENCOUNTER — Encounter: Payer: Self-pay | Admitting: Cardiology

## 2012-10-05 ENCOUNTER — Ambulatory Visit (INDEPENDENT_AMBULATORY_CARE_PROVIDER_SITE_OTHER): Payer: Self-pay | Admitting: Cardiology

## 2012-10-05 VITALS — BP 130/84 | HR 80 | Ht 71.0 in | Wt 156.1 lb

## 2012-10-05 DIAGNOSIS — J449 Chronic obstructive pulmonary disease, unspecified: Secondary | ICD-10-CM

## 2012-10-05 DIAGNOSIS — A310 Pulmonary mycobacterial infection: Secondary | ICD-10-CM

## 2012-10-05 DIAGNOSIS — E785 Hyperlipidemia, unspecified: Secondary | ICD-10-CM

## 2012-10-05 DIAGNOSIS — Z9289 Personal history of other medical treatment: Secondary | ICD-10-CM

## 2012-10-05 DIAGNOSIS — Z9189 Other specified personal risk factors, not elsewhere classified: Secondary | ICD-10-CM

## 2012-10-05 DIAGNOSIS — I341 Nonrheumatic mitral (valve) prolapse: Secondary | ICD-10-CM

## 2012-10-05 DIAGNOSIS — F419 Anxiety disorder, unspecified: Secondary | ICD-10-CM | POA: Insufficient documentation

## 2012-10-05 DIAGNOSIS — J4489 Other specified chronic obstructive pulmonary disease: Secondary | ICD-10-CM | POA: Insufficient documentation

## 2012-10-05 DIAGNOSIS — I059 Rheumatic mitral valve disease, unspecified: Secondary | ICD-10-CM

## 2012-10-05 DIAGNOSIS — A318 Other mycobacterial infections: Secondary | ICD-10-CM

## 2012-10-05 DIAGNOSIS — R079 Chest pain, unspecified: Secondary | ICD-10-CM

## 2012-10-05 DIAGNOSIS — K219 Gastro-esophageal reflux disease without esophagitis: Secondary | ICD-10-CM

## 2012-10-05 DIAGNOSIS — G43909 Migraine, unspecified, not intractable, without status migrainosus: Secondary | ICD-10-CM

## 2012-10-05 NOTE — Telephone Encounter (Signed)
Called pt at Dr Rothbart's request to clarify the MAI issue and got recorder and stated the MAI does not appear to be an active problem by cxr on hx/ exam and nothing further needs to be done about it  If Dr Sherwood Gambler or pt are still concerned she'll need a second opinion in ID clinic where she was previously followed but I doubt this will be fruitful.  Please send copy to Drs Dietrich Pates, Sherwood Gambler

## 2012-10-05 NOTE — Progress Notes (Signed)
Patient ID: Sharon Gay, female   DOB: Apr 10, 1966, 47 y.o.   MRN: [id number]  HPI: Pleasant young woman seen at the request of Dr. Sherwood Gambler for evaluation of chronic chest pain.  Ms. Caraveo was previously followed by Dr. Allyson Sabal of [facility] after presenting with chest pain and undergoing urgent cardiac catheterization with negative results.  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 Her chest discomfort is inframammary, localized, intermittently sharp, of variable duration but sometimes prolonged and of mild to moderate severity. She has been seen in the emergency department on occasion without a specific diagnosis established. She generally takes an 81 mg aspirin at the onset of symptoms, which she believes provides some relief. She was treated with nitroglycerin in 2009 immediately preceding coronary angiography, but does not recall any benefit from that drug. She does not use nonsteroidal analgesics, but does require oxycodone once or twice a week for headaches. She experiences chest discomfort a few times per week.  Current Outpatient Prescriptions on File Prior to Visit  Medication Sig Dispense Refill  . albuterol (PROVENTIL HFA;VENTOLIN HFA) 108 (90 BASE) MCG/ACT inhaler Inhale 2 puffs into the lungs every 6 (six) hours as needed.      . ALPRAZolam (XANAX) 1 MG tablet Take 1 mg by mouth as directed. Take 1-2 as needed anxiety       . Cimetidine (ACID REDUCER PO) Take 1 tablet by mouth as needed.      Marland Kitchen oxyCODONE-acetaminophen (PERCOCET/ROXICET) 5-325 MG per tablet Take 1 tablet by mouth every 4 (four) hours as needed.      Marland Kitchen PARoxetine (PAXIL) 10 MG tablet Take 10 mg by mouth every morning.      . promethazine (PHENERGAN) 25 MG tablet Take 25 mg by mouth every 6 (six) hours as needed.       Allergies  Allergen Reactions    . Morphine     REACTION: hallucinations  . Penicillins   . Rifampin     REACTION: skin yellowed,blisters around lips and tongue, urine was orange in color,nausea  . Tequin (Gatifloxacin)     Past Medical History  Diagnosis Date  . Anxiety   . Migraines   . COPD (chronic obstructive pulmonary disease)     11/2003: Normal PFTs ex minimal increased volumes and decreased the DLCO; Followed in Pulmonary clinic/ [facility]/ [facility]   . Chest pain 2014    11/2007: Admission with unstable angina; normal coronary angiography  with [facility]; nl EF  . Mycobacterium avium complex 2010    01/2009: Positive stain on bronchial washings  . Gastroesophageal reflux disease 2009    Hiatal hernia; esophageal dilatation for Schatzki's ring in 04/2008; chronic constipation; irritable bowel syndrome  . Colitis 2005   Past Surgical History  Procedure Date  . Vocal cord polyp removed 2003  . Orif radial fracture 2012    Left  . Fiberoptic bronchoscopy 2010  . Colonoscopy w/ polypectomy 2006   Family History  Problem Relation Age of Onset  . Emphysema Maternal Grandfather     was a smoker  . Asthma Sister   . Heart disease Sister     Also brother; cardiomyopathy  . Heart attack Father 78    History   Social History  . Marital Status: Married    Spouse Name: N/A    Number of Children: 2  .  Years of Education: N/A   Occupational History  . Unemployed     prior [employer] work  .     Social History Main Topics  . Smoking status: Current Every Day Smoker -- 0.5 packs/day for 30 years    Types: Cigarettes  . Smokeless tobacco: Not on file     Comment: 09/2012: 4-5 cigerattes a week; using transdermal nicotine in quit attempt  . Alcohol Use: No  . Drug Use: No     Comment: Excessive caffeine- 10 cups of coffee per day  . Sexually Active: Not on file   Other Topics Concern  . Not on file   Social History Narrative   2 stepchildren  ROS: Generalized  fatigue; appointment with ENT next week to evaluate right neck mass; intermittent palpitations; resting and exertional dyspnea; GERD; negative colonoscopy in 2013; chronic low back pain and arthritic discomfort; history of migraine headache; history of depression, anxiety and panic attacks; heat and cold intolerance; menstrual irregularities.  All other systems reviewed and are negative.  PHYSICAL EXAM: BP 130/84  Pulse 80  Ht 5\' 11"  (1.803 m)  Wt 70.816 kg (156 lb 1.9 oz)  BMI 21.77 kg/m2  SpO2 97%  General-Well-developed; no acute distress Body Habitus-proportionate weight and height HEENT-[place]/AT; PERRL; EOM intact; conjunctiva and lids nl Neck-No JVD; no carotid bruits Endocrine-No thyromegaly Lungs-Clear lung fields; resonant percussion; normal I-to-E ratio Cardiovascular- normal PMI; normal S1 and S2 Abdomen-BS normal; soft and non-tender without masses or organomegaly Musculoskeletal-No deformities, cyanosis or clubbing Neurologic-Nl cranial nerves; symmetric strength and tone Skin- Warm, no significant lesions Extremities-Nl distal pulses; no edema  EKG:  Normal sinus rhythm; left atrial abnormality; low-voltage; otherwise normal.   ASSESSMENT AND PLAN:  Joliet Bing, MD 10/05/2012 2:14 PM

## 2012-10-05 NOTE — Progress Notes (Signed)
Quick Note:  Spoke with pt and notified of results per Dr. Wert. Pt verbalized understanding and denied any questions.  ______ 

## 2012-10-05 NOTE — Progress Notes (Deleted)
Name: Sharon Gay    DOB: 1966-04-21  Age: 47 y.o.  MR#: [id number]       PCP:  Cassell Smiles., MD      Insurance: @PAYORNAME @   CC:    Chief Complaint  Patient presents with  . Follow-up    chest pains   MEDICATION LIST PREVIOUS [facility] PT - DR GAMBLE 2009 -(WILL REQUEST RECORDS) CATH 11/2007  VS BP 130/84  Pulse 80  Ht 5\' 11"  (1.803 m)  Wt 156 lb 1.9 oz (70.816 kg)  BMI 21.77 kg/m2  SpO2 97%  Weights Current Weight  10/05/12 156 lb 1.9 oz (70.816 kg)  10/02/12 155 lb 9.6 oz (70.58 kg)  02/06/10 147 lb 12.8 oz (67.042 kg)    Blood Pressure  BP Readings from Last 3 Encounters:  10/05/12 130/84  10/02/12 104/78  02/06/10 128/84     Admit date:  (Not on file) Last encounter with [facility]:  Visit date not found   Allergy Allergies  Allergen Reactions  . Morphine     REACTION: hallucinations  . Penicillins   . Rifampin     REACTION: skin yellowed,blisters around lips and tongue, urine was orange in color,nausea  . Tequin (Gatifloxacin)     Current Outpatient Prescriptions  Medication Sig Dispense Refill  . albuterol (PROVENTIL HFA;VENTOLIN HFA) 108 (90 BASE) MCG/ACT inhaler Inhale 2 puffs into the lungs every 6 (six) hours as needed.      . ALPRAZolam (XANAX) 1 MG tablet Take 1 mg by mouth as directed. Take 1-2 as needed anxiety       . aspirin 81 MG tablet Take 81 mg by mouth as needed.      . Cimetidine (ACID REDUCER PO) Take 1 tablet by mouth as needed.      Marland Kitchen oxyCODONE-acetaminophen (PERCOCET/ROXICET) 5-325 MG per tablet Take 1 tablet by mouth every 4 (four) hours as needed.      Marland Kitchen PARoxetine (PAXIL) 10 MG tablet Take 10 mg by mouth every morning.      . promethazine (PHENERGAN) 25 MG tablet Take 25 mg by mouth every 6 (six) hours as needed.        Discontinued Meds:   There are no discontinued medications.  Patient Active Problem List  Diagnosis  . MIGRAINE HEADACHE  . Anxiety  . COPD (chronic obstructive pulmonary disease)  . Chest pain  .  Mycobacterium avium complex  . Gastroesophageal reflux disease    LABS No visits with results within 3 Month(s) from this visit. Latest known visit with results is:  Admission on 04/12/2010, Discharged on 04/13/2010  Component Date Value  . MRSA, PCR 04/12/2010 NEGATIVE   . Staphylococcus aureus 04/12/2010                     Value:NEGATIVE                                The Xpert SA Assay (FDA                         approved for NASAL specimens                         only), is one component of                         a comprehensive  surveillance                         program.  It is not intended                         to diagnose infection nor to                         guide or monitor treatment.  . WBC 04/12/2010 10.2   . RBC 04/12/2010 4.16   . Hemoglobin 04/12/2010 14.1   . HCT 04/12/2010 40.9   . MCV 04/12/2010 98.3   . [facility] 04/12/2010 33.8   . MCHC 04/12/2010 34.4   . RDW 04/12/2010 13.1   . Platelets 04/12/2010 263   . Sodium 04/12/2010 140   . Potassium 04/12/2010 4.0   . Chloride 04/12/2010 105   . CO2 04/12/2010 28   . Glucose, Bld 04/12/2010 97   . BUN 04/12/2010 11   . Creatinine, Ser 04/12/2010 0.88   . Calcium 04/12/2010 9.4   . Total Protein 04/12/2010 6.8   . Albumin 04/12/2010 3.8   . AST 04/12/2010 25   . ALT 04/12/2010 18   . Alkaline Phosphatase 04/12/2010 54   . Total Bilirubin 04/12/2010 0.7   . GFR calc non Af Amer 04/12/2010 >60   . GFR calc Af Amer 04/12/2010                     Value:>60                                The eGFR has been calculated                         using the MDRD equation.                         This calculation has not been                         validated in all clinical                         situations.                         eGFR's persistently                         <60 mL/min signify                         possible Chronic Kidney Disease.  Marland Kitchen Neutrophils Relative 04/12/2010 64   . Neutro Abs  04/12/2010 6.6   . Lymphocytes Relative 04/12/2010 29   . Lymphs Abs 04/12/2010 3.0   . Monocytes Relative 04/12/2010 4   . Monocytes Absolute 04/12/2010 0.4   . Eosinophils Relative 04/12/2010 2   . Eosinophils Absolute 04/12/2010 0.2   . Basophils Relative 04/12/2010 1   . Basophils Absolute 04/12/2010 0.1      Results for this Opt Visit:     Results for orders placed during the hospital encounter of 04/12/10  SURGICAL PCR SCREEN      Component  Value Range   MRSA, PCR NEGATIVE  NEGATIVE   Staphylococcus aureus    NEGATIVE   Value: NEGATIVE            The Xpert SA Assay (FDA     approved for NASAL specimens     only), is one component of     a comprehensive surveillance     program.  It is not intended     to diagnose infection nor to     guide or monitor treatment.  CBC      Component Value Range   WBC 10.2  4.0 - 10.5 K/uL   RBC 4.16  3.87 - 5.11 MIL/uL   Hemoglobin 14.1  12.0 - 15.0 g/dL   HCT 95.6  21.3 - 08.6 %   MCV 98.3  78.0 - 100.0 fL   MCH 33.8  26.0 - 34.0 pg   MCHC 34.4  30.0 - 36.0 g/dL   RDW 57.8  46.9 - 62.9 %   Platelets 263  150 - 400 K/uL  COMPREHENSIVE METABOLIC PANEL      Component Value Range   Sodium 140  135 - 145 mEq/L   Potassium 4.0  3.5 - 5.1 mEq/L   Chloride 105  96 - 112 mEq/L   CO2 28  19 - 32 mEq/L   Glucose, Bld 97  70 - 99 mg/dL   BUN 11  6 - 23 mg/dL   Creatinine, Ser 5.28  0.4 - 1.2 mg/dL   Calcium 9.4  8.4 - 41.3 mg/dL   Total Protein 6.8  6.0 - 8.3 g/dL   Albumin 3.8  3.5 - 5.2 g/dL   AST 25  0 - 37 U/L   ALT 18  0 - 35 U/L   Alkaline Phosphatase 54  39 - 117 U/L   Total Bilirubin 0.7  0.3 - 1.2 mg/dL   GFR calc non Af Amer >60  >60 mL/min   GFR calc Af Amer    >60 mL/min   Value: >60            The eGFR has been calculated     using the MDRD equation.     This calculation has not been     validated in all clinical     situations.     eGFR's persistently     <60 mL/min signify     possible Chronic Kidney Disease.    DIFFERENTIAL      Component Value Range   Neutrophils Relative 64  43 - 77 %   Neutro Abs 6.6  1.7 - 7.7 K/uL   Lymphocytes Relative 29  12 - 46 %   Lymphs Abs 3.0  0.7 - 4.0 K/uL   Monocytes Relative 4  3 - 12 %   Monocytes Absolute 0.4  0.1 - 1.0 K/uL   Eosinophils Relative 2  0 - 5 %   Eosinophils Absolute 0.2  0.0 - 0.7 K/uL   Basophils Relative 1  0 - 1 %   Basophils Absolute 0.1  0.0 - 0.1 K/uL    EKG Orders placed in visit on 10/05/12  . EKG 12-LEAD     Prior Assessment and Plan Problem List as of 10/05/2012            Cardiology Problems   MIGRAINE HEADACHE     Other   Anxiety   COPD (chronic obstructive pulmonary disease)   Chest pain   Mycobacterium avium complex   Gastroesophageal reflux disease  Imaging: Dg Chest 2 View  10/02/2012  *RADIOLOGY REPORT*  Clinical Data: Cough and shortness of breath.  CHEST - 2 VIEW  Comparison: 10/31/2009.  Findings: The cardiac silhouette, mediastinal and hilar contours are normal and stable.  The lungs are clear.  No pleural effusion. The bony thorax is intact.  IMPRESSION: No acute cardiopulmonary findings.   Original Report Authenticated By: Rudie Meyer, M.D.      [facility] Calculation: Score not calculated. Missing: Total Cholesterol, HDL

## 2012-10-05 NOTE — Patient Instructions (Addendum)
Your physician recommends that you schedule a follow-up appointment in: 2 months  Keep a pain diary and bring to your follow up visit  Your physician has recommended you make the following change in your medication:  1 - You may take 650 mg of aspirin three times a day at onset of pain

## 2012-10-05 NOTE — Telephone Encounter (Signed)
Done

## 2012-10-06 ENCOUNTER — Encounter: Payer: Self-pay | Admitting: Cardiology

## 2012-10-06 DIAGNOSIS — E785 Hyperlipidemia, unspecified: Secondary | ICD-10-CM | POA: Insufficient documentation

## 2012-10-06 DIAGNOSIS — Z9289 Personal history of other medical treatment: Secondary | ICD-10-CM | POA: Insufficient documentation

## 2012-10-06 DIAGNOSIS — I341 Nonrheumatic mitral (valve) prolapse: Secondary | ICD-10-CM | POA: Insufficient documentation

## 2012-10-06 NOTE — Assessment & Plan Note (Signed)
More intensive GI treatment may be appropriate if symptoms persist.

## 2012-10-06 NOTE — Assessment & Plan Note (Signed)
Moderate elevation of lipids with no known vascular disease and fairly low risk of same. Pharmacologic therapy not mandatory, but can be considered.

## 2012-10-06 NOTE — Assessment & Plan Note (Signed)
COPD is likely minimal and not contributing to symptoms. Patient congratulated on efforts to discontinue tobacco use and encouraged to continue.

## 2012-10-06 NOTE — Assessment & Plan Note (Signed)
Chest pain is chronic, not particularly troublesome to patient, and extensive cardiac testing was negative 4 years ago.  Presence of significant coronary disease or other important cardiac disease is unlikely. Chest discomfort most consistent with a musculoskeletal etiology or gastrointestinal origin. She will treat empirically with nonsteroidals for the next 2 months and maintain a diary to more precisely define her symptoms. No additional testing is planned at present.

## 2012-10-06 NOTE — Assessment & Plan Note (Signed)
No physical findings of MVP. No structural abnormalities of the valve described on prior echocardiogram. It is unlikely that she will ever suffered significant morbidity related to mitral valve disease.

## 2012-10-06 NOTE — Assessment & Plan Note (Addendum)
Discussed with Dr. Sherene Sires.  Based upon his assessment and review of records, he does not believe she had a true atypical mycobacterial infection, does not believe that she required the antimycobacterial therapy she received, and does not feel that any additional testing or treatment for this entity is necessary at present.  This information was conveyed to [name].

## 2012-10-08 ENCOUNTER — Encounter: Payer: Self-pay | Admitting: Cardiology

## 2012-10-14 ENCOUNTER — Encounter: Payer: Self-pay | Admitting: Cardiology

## 2012-11-16 ENCOUNTER — Ambulatory Visit (INDEPENDENT_AMBULATORY_CARE_PROVIDER_SITE_OTHER): Payer: Self-pay | Admitting: Infectious Disease

## 2012-11-16 ENCOUNTER — Encounter: Payer: Self-pay | Admitting: Infectious Disease

## 2012-11-16 VITALS — BP 138/95 | HR 76 | Temp 98.2°F | Wt 162.0 lb

## 2012-11-16 DIAGNOSIS — R079 Chest pain, unspecified: Secondary | ICD-10-CM

## 2012-11-16 DIAGNOSIS — F32A Depression, unspecified: Secondary | ICD-10-CM

## 2012-11-16 DIAGNOSIS — F17200 Nicotine dependence, unspecified, uncomplicated: Secondary | ICD-10-CM

## 2012-11-16 DIAGNOSIS — A310 Pulmonary mycobacterial infection: Secondary | ICD-10-CM

## 2012-11-16 DIAGNOSIS — J441 Chronic obstructive pulmonary disease with (acute) exacerbation: Secondary | ICD-10-CM

## 2012-11-16 DIAGNOSIS — Z2239 Carrier of other specified bacterial diseases: Secondary | ICD-10-CM

## 2012-11-16 DIAGNOSIS — F411 Generalized anxiety disorder: Secondary | ICD-10-CM

## 2012-11-16 NOTE — Progress Notes (Signed)
Subjective:    Patient ID: Sharon Gay, female    DOB: 11/09/1965, 47 y.o.   MRN: [id number]  HPI  47 year old with chronic smoking, COPD, bronchectasis GERD, migraine headache, MVP, anxiety with panic attacks who was seen by my former partner Sharon Gay in 2010. At that time the pt had had been worked up by Dr. Juanetta Gay and had  2 chest CT's one of which suggested tracheal papillomatosis in 10/2008 a second which had suggested mucous plugging in right upper and lower lobes. She was ultimately referred to Dr. Edwyna Gay who performed FOB which showed ALL mucous plugging and NO nodules. A BAL taken during that procedure did grow M Avium. Pt was seen by Dr. Sampson Gay who opted to start the pt on therapy for M Avium. NOTE at that time the patient had symptoms of reported DOE, productive cough, weight loss, malaise and chest pain. Her clinical story was not entirely convincing but that coupled with ? Changes on CT (later found to reflec mucous plugs per Dr. Edwyna Gay) and deep BAL culture positive were deemed sufficient to make tentative dx. Pt DID NOT tolerate 3 drug therapy for M AVium at all and only took drugs for 6 weeks.  Since then she has NOT lost weight but rather gained it.  She has not had persistent fevers.  She has had a chronic cough but it is LARGELY NONPRODUCTIVE.   She has continued to have a great deal of "chest discomfort and pain" though she points to her lower back when she mentions this. It is particularly severe in the early am when she gets out of bed and with chronic standing. She is being followed by Dr. Sherwood Gay for this. She was referred again to Dr. Sherene Gay with [facility] for SOB and possible MAvium. He had 2 v chest xray done which showed no infiltrates, nodules or cavities whatsoever. He found no clinical evidence for M Avium infection.   Patient was referred to [place] in [place] for another opinion yet again.    I have thoroughly reviewed nature epidemiology, ecology of M Avium  and discussed its role as a colonizer of lungs and as a pathogen.   I FEEL VERY strongly that Sharon Gay has NO evidence for M avium infection currently and doubt the original dx in 2010.  I spent greater than 60 minutes with the patient including greater than 50% of time in face to face counsel of the patient and in coordination of their care.     Review of Systems  Constitutional: Positive for activity change, appetite change and fatigue. Negative for fever, chills, diaphoresis and unexpected weight change.  HENT: Negative for congestion, sore throat, rhinorrhea, sneezing, trouble swallowing and sinus pressure.   Eyes: Negative for photophobia and visual disturbance.  Respiratory: Positive for shortness of breath. Negative for cough, chest tightness, wheezing and stridor.   Cardiovascular: Positive for chest pain and palpitations. Negative for leg swelling.  Gastrointestinal: Negative for nausea, vomiting, abdominal pain, diarrhea, constipation, blood in stool, abdominal distention and anal bleeding.  Genitourinary: Negative for dysuria, hematuria, flank pain and difficulty urinating.  Musculoskeletal: Negative for myalgias, back pain, joint swelling, arthralgias and gait problem.  Skin: Negative for color change, pallor, rash and wound.  Neurological: Negative for dizziness, tremors, weakness and light-headedness.  Hematological: Negative for adenopathy. Does not bruise/bleed easily.  Psychiatric/Behavioral: Positive for confusion, sleep disturbance, dysphoric mood and decreased concentration. Negative for behavioral problems and agitation. The patient is nervous/anxious.  Objective:   Physical Exam  Constitutional: She is oriented to person, place, and time. She appears well-developed and well-nourished. No distress.  HENT:  Head: Normocephalic and atraumatic.  Mouth/Throat: Oropharynx is clear and moist. No oropharyngeal exudate.  Eyes: Conjunctivae and EOM are normal.  Pupils are equal, round, and reactive to light. No scleral icterus.  Neck: Normal range of motion. Neck supple. No JVD present.  Cardiovascular: Normal rate, regular rhythm and normal heart sounds.  Exam reveals no gallop and no friction rub.   No murmur heard. Pulmonary/Chest: Effort normal and breath sounds normal. No respiratory distress. She has no wheezes. She has no rales. She exhibits no tenderness.  Abdominal: She exhibits no distension and no mass. There is no tenderness. There is no rebound and no guarding.  Musculoskeletal: She exhibits no edema and no tenderness.  Lymphadenopathy:    She has no cervical adenopathy.  Neurological: She is alert and oriented to person, place, and time. She exhibits normal muscle tone. Coordination normal.  Skin: Skin is warm and dry. She is not diaphoretic. No erythema. No pallor.  Psychiatric: Her behavior is normal. Judgment and thought content normal. She exhibits a depressed mood.  Slightly depressed affect but pleasant          Assessment & Plan:   ? M avium infection: Looking back her Mycobacterium avium in 2010 was undoubtedly an asymptomatic colonizer but her constellation of other ssx made diagnosis less clear. CUrrently SHe DOES NOT meet criteria for infection at all.   --Her symptoms ARE NOT suggestive of M avium infection --Her CXR, and prior CT findings are UNDERWHELMING and not esp c/w M avium (her nodules seen on CT were found in FOBT by Dr. Edwyna Gay to be all secretions) --her BAL while sensitive is insufficient by itself to make diagnosis  --No further workup needed at this time  Chest pain: not clear what cause is. May very well be related to her anxiety and depression. No pain at other sites so Fibromyalgia not likely  COPD: she has fortunately stopped smoking x 2 months. I have encouraged her to re-est care with Pulmonologist and she is considering Dr. Juanetta Gay.  Depression and anxiety: followed by Dr Sharon Gay but Sharon Gay if pt  would benefit from psychiatrist and or CBT

## 2012-12-08 ENCOUNTER — Ambulatory Visit: Payer: Self-pay | Admitting: Cardiology

## 2013-01-15 ENCOUNTER — Ambulatory Visit (HOSPITAL_COMMUNITY): Payer: Self-pay

## 2013-05-17 ENCOUNTER — Ambulatory Visit: Payer: Self-pay | Admitting: Infectious Disease

## 2013-06-01 ENCOUNTER — Encounter: Payer: Self-pay | Admitting: *Deleted

## 2014-02-04 ENCOUNTER — Other Ambulatory Visit (HOSPITAL_COMMUNITY): Payer: Self-pay | Admitting: Internal Medicine

## 2014-02-04 DIAGNOSIS — R51 Headache: Secondary | ICD-10-CM

## 2014-02-04 DIAGNOSIS — I671 Cerebral aneurysm, nonruptured: Secondary | ICD-10-CM

## 2014-02-04 DIAGNOSIS — Z0000 Encounter for general adult medical examination without abnormal findings: Secondary | ICD-10-CM

## 2014-02-04 DIAGNOSIS — R519 Headache, unspecified: Secondary | ICD-10-CM

## 2014-02-07 ENCOUNTER — Ambulatory Visit (HOSPITAL_COMMUNITY): Payer: Self-pay

## 2014-02-14 ENCOUNTER — Ambulatory Visit (HOSPITAL_COMMUNITY): Admission: RE | Admit: 2014-02-14 | Payer: BC Managed Care – PPO | Source: Ambulatory Visit

## 2014-02-14 ENCOUNTER — Ambulatory Visit (HOSPITAL_COMMUNITY): Payer: Self-pay

## 2014-02-18 ENCOUNTER — Ambulatory Visit (HOSPITAL_COMMUNITY): Payer: Self-pay

## 2014-02-18 ENCOUNTER — Ambulatory Visit (HOSPITAL_COMMUNITY): Admission: RE | Admit: 2014-02-18 | Payer: BC Managed Care – PPO | Source: Ambulatory Visit

## 2014-03-03 ENCOUNTER — Other Ambulatory Visit (HOSPITAL_COMMUNITY): Payer: Self-pay | Admitting: Internal Medicine

## 2014-03-03 DIAGNOSIS — Z139 Encounter for screening, unspecified: Secondary | ICD-10-CM

## 2014-03-08 ENCOUNTER — Ambulatory Visit (HOSPITAL_COMMUNITY)
Admission: RE | Admit: 2014-03-08 | Discharge: 2014-03-08 | Disposition: A | Discharge status: Home / Self Care | Payer: BC Managed Care – PPO | Source: Ambulatory Visit | Attending: Internal Medicine | Admitting: Internal Medicine

## 2014-03-08 DIAGNOSIS — Z139 Encounter for screening, unspecified: Secondary | ICD-10-CM

## 2014-03-08 DIAGNOSIS — R519 Headache, unspecified: Secondary | ICD-10-CM

## 2014-03-08 DIAGNOSIS — I671 Cerebral aneurysm, nonruptured: Secondary | ICD-10-CM | POA: Insufficient documentation

## 2014-03-08 DIAGNOSIS — Z1231 Encounter for screening mammogram for malignant neoplasm of breast: Secondary | ICD-10-CM | POA: Insufficient documentation

## 2014-03-08 DIAGNOSIS — R51 Headache: Secondary | ICD-10-CM | POA: Insufficient documentation

## 2015-03-22 ENCOUNTER — Other Ambulatory Visit (HOSPITAL_COMMUNITY): Payer: Self-pay | Admitting: Respiratory Therapy

## 2015-03-22 DIAGNOSIS — R0602 Shortness of breath: Secondary | ICD-10-CM

## 2015-03-29 ENCOUNTER — Ambulatory Visit (HOSPITAL_COMMUNITY): Admission: RE | Admit: 2015-03-29 | Payer: Self-pay | Source: Ambulatory Visit

## 2015-04-04 ENCOUNTER — Other Ambulatory Visit: Payer: Self-pay | Admitting: Adult Health

## 2015-04-04 ENCOUNTER — Encounter: Payer: Self-pay | Admitting: Adult Health

## 2015-04-21 ENCOUNTER — Ambulatory Visit (HOSPITAL_COMMUNITY)
Admission: RE | Admit: 2015-04-21 | Discharge: 2015-04-21 | Disposition: A | Discharge status: Home / Self Care | Payer: BLUE CROSS/BLUE SHIELD | Source: Ambulatory Visit | Attending: Pulmonary Disease | Admitting: Pulmonary Disease

## 2015-04-21 DIAGNOSIS — R0602 Shortness of breath: Secondary | ICD-10-CM | POA: Diagnosis not present

## 2015-04-21 MED ORDER — ALBUTEROL SULFATE (2.5 MG/3ML) 0.083% IN NEBU
2.5000 mg | INHALATION_SOLUTION | Freq: Once | RESPIRATORY_TRACT | Status: AC
  Administered 2015-04-21: 2.5 mg via RESPIRATORY_TRACT

## 2015-04-26 LAB — PULMONARY FUNCTION TEST
DL/VA % PRED: 42 %
DL/VA: 2.33 ml/min/mmHg/L
DLCO UNC: 17.4 ml/min/mmHg
DLCO unc % pred: 51 %
FEF 25-75 POST: 2.24 L/s
FEF 25-75 Pre: 2.15 L/sec
FEF2575-%CHANGE-POST: 3 %
FEF2575-%PRED-POST: 69 %
FEF2575-%PRED-PRE: 66 %
FEV1-%Change-Post: 0 %
FEV1-%PRED-POST: 78 %
FEV1-%Pred-Pre: 77 %
FEV1-POST: 2.76 L
FEV1-Pre: 2.75 L
FEV1FVC-%CHANGE-POST: 6 %
FEV1FVC-%Pred-Pre: 95 %
FEV6-%Change-Post: -5 %
FEV6-%PRED-PRE: 83 %
FEV6-%Pred-Post: 78 %
FEV6-PRE: 3.62 L
FEV6-Post: 3.41 L
FEV6FVC-%Change-Post: 0 %
FEV6FVC-%Pred-Post: 103 %
FEV6FVC-%Pred-Pre: 103 %
FVC-%CHANGE-POST: -5 %
FVC-%Pred-Post: 76 %
FVC-%Pred-Pre: 81 %
FVC-PRE: 3.62 L
FVC-Post: 3.41 L
POST FEV1/FVC RATIO: 81 %
PRE FEV1/FVC RATIO: 76 %
Post FEV6/FVC ratio: 100 %
Pre FEV6/FVC Ratio: 100 %
RV % pred: 105 %
RV: 2.24 L
TLC % pred: 92 %
TLC: 5.61 L

## 2015-05-04 ENCOUNTER — Other Ambulatory Visit (HOSPITAL_COMMUNITY): Payer: Self-pay | Admitting: Internal Medicine

## 2015-05-04 DIAGNOSIS — Z1231 Encounter for screening mammogram for malignant neoplasm of breast: Secondary | ICD-10-CM

## 2015-05-05 ENCOUNTER — Other Ambulatory Visit (HOSPITAL_COMMUNITY): Payer: Self-pay | Admitting: Internal Medicine

## 2015-05-08 ENCOUNTER — Ambulatory Visit (HOSPITAL_COMMUNITY): Payer: BLUE CROSS/BLUE SHIELD

## 2015-06-02 ENCOUNTER — Other Ambulatory Visit (HOSPITAL_COMMUNITY): Payer: Self-pay | Admitting: Internal Medicine

## 2015-06-02 DIAGNOSIS — A310 Pulmonary mycobacterial infection: Secondary | ICD-10-CM

## 2015-06-08 ENCOUNTER — Other Ambulatory Visit: Payer: Self-pay | Admitting: Neurology

## 2015-06-08 DIAGNOSIS — R531 Weakness: Secondary | ICD-10-CM

## 2015-06-14 ENCOUNTER — Ambulatory Visit (HOSPITAL_COMMUNITY)
Admission: RE | Admit: 2015-06-14 | Discharge: 2015-06-14 | Disposition: A | Discharge status: Home / Self Care | Payer: BLUE CROSS/BLUE SHIELD | Source: Ambulatory Visit | Attending: Internal Medicine | Admitting: Internal Medicine

## 2015-06-14 DIAGNOSIS — J449 Chronic obstructive pulmonary disease, unspecified: Secondary | ICD-10-CM | POA: Insufficient documentation

## 2015-06-14 DIAGNOSIS — A310 Pulmonary mycobacterial infection: Secondary | ICD-10-CM

## 2015-06-14 DIAGNOSIS — R05 Cough: Secondary | ICD-10-CM | POA: Insufficient documentation

## 2015-06-14 DIAGNOSIS — R062 Wheezing: Secondary | ICD-10-CM | POA: Insufficient documentation

## 2015-06-27 ENCOUNTER — Ambulatory Visit (HOSPITAL_COMMUNITY): Payer: BLUE CROSS/BLUE SHIELD

## 2015-07-04 ENCOUNTER — Ambulatory Visit (HOSPITAL_COMMUNITY): Payer: BLUE CROSS/BLUE SHIELD

## 2015-07-17 ENCOUNTER — Ambulatory Visit (HOSPITAL_COMMUNITY): Payer: BLUE CROSS/BLUE SHIELD

## 2015-07-17 ENCOUNTER — Encounter: Payer: Self-pay | Admitting: Internal Medicine

## 2015-07-18 ENCOUNTER — Ambulatory Visit (HOSPITAL_COMMUNITY): Payer: BLUE CROSS/BLUE SHIELD

## 2015-08-08 ENCOUNTER — Ambulatory Visit (HOSPITAL_COMMUNITY)
Admission: RE | Admit: 2015-08-08 | Discharge: 2015-08-08 | Disposition: A | Discharge status: Home / Self Care | Payer: BLUE CROSS/BLUE SHIELD | Source: Ambulatory Visit | Attending: Neurology | Admitting: Neurology

## 2015-08-08 DIAGNOSIS — R531 Weakness: Secondary | ICD-10-CM

## 2015-08-09 ENCOUNTER — Ambulatory Visit (INDEPENDENT_AMBULATORY_CARE_PROVIDER_SITE_OTHER): Payer: BLUE CROSS/BLUE SHIELD | Admitting: Gastroenterology

## 2015-08-09 ENCOUNTER — Encounter: Payer: Self-pay | Admitting: Gastroenterology

## 2015-08-09 ENCOUNTER — Other Ambulatory Visit: Payer: Self-pay

## 2015-08-09 VITALS — BP 106/64 | HR 63 | Temp 97.3°F | Ht 71.0 in | Wt 127.6 lb

## 2015-08-09 DIAGNOSIS — R1030 Lower abdominal pain, unspecified: Secondary | ICD-10-CM

## 2015-08-09 DIAGNOSIS — K219 Gastro-esophageal reflux disease without esophagitis: Secondary | ICD-10-CM | POA: Diagnosis not present

## 2015-08-09 DIAGNOSIS — R109 Unspecified abdominal pain: Secondary | ICD-10-CM | POA: Insufficient documentation

## 2015-08-09 MED ORDER — PEG 3350-KCL-NA BICARB-NACL 420 G PO SOLR: 4000.0000 mL | Freq: Once | ORAL | Status: DC

## 2015-08-09 MED ORDER — DEXLANSOPRAZOLE 60 MG PO CPDR: 60.0000 mg | DELAYED_RELEASE_CAPSULE | Freq: Every day | ORAL | Status: DC

## 2015-08-09 NOTE — Progress Notes (Signed)
Primary Care Physician:  Cassell Smiles., MD Primary Gastroenterologist:  Dr. Jena Gauss   Chief Complaint  Patient presents with  . Abdominal Pain    HPI:   Sharon Gay is a 49 y.o. female presenting today at the request of Dr. Sherwood Gambler secondary to abdominal pain and vomiting. Review of outside labs with positive H.pylori serology s/p treatment per patient. No anemia noted on CBC. Normal LFTs.  Pain is located in lower abdomen. Sometimes hard to pinpoint. History of constipation but if ate ice cream could have a BM every day. BMs are better now with the acai berry. Bristol scale #4. Pain is not improved after BM. Pain is constant. No rectal bleeding. Was down to 104 lbs. Now 127. Eating toast and baked potatoes. Was 152 last year and dropped weight. Takes Protonix daily. Sometimes has to take 2. Stays nauseated but has history of migraines. Feels nauseated about every day. Notes pill dysphagia. Solid food dysphagia. Pain present over a year. Constant, sometimes cramping. Burning. Has chills, sometimes sweats. Diagnosed with MS. Has tried Prevacid, Prilosec, and currently on Protonix.   Previously underwent EGD by Dr. Jena Gauss in 2009 with normal esophagus, empiric dilation. Remote colonoscopy in 2006 with 3 diminutive polyps in ascending colon but path unknown.   Past Medical History  Diagnosis Date  . Anxiety   . Migraines   . COPD (chronic obstructive pulmonary disease) (HCC)     11/2003: Normal PFTs ex minimal increased volumes and decreased the DLCO; Followed in Pulmonary clinic/ [facility]/ [place]   . Chest pain 2003    11/2007: Admission with unstable angina; normal coronary angiography  with [facility]; nl EF  . Mycobacterium avium complex (HCC) 2010    01/2009: + AFB stain on bronchial washings  . Gastroesophageal reflux disease 2009    Hiatal hernia; esophageal dilatation for Schatzki's ring in 04/2008; chronic constipation; irritable bowel syndrome  .  Colitis 2005  . Hyperlipidemia 2009    Lipid profile 07/2012:227, 91, 56, 153  . Mitral valve prolapse   . MS (multiple sclerosis) (HCC)   . Mitral valve prolapse   . Lung disease 2010  . MS (multiple sclerosis) (HCC) 2001  . PONV (postoperative nausea and vomiting)     Past Surgical History  Procedure Laterality Date  . Vocal cord polyp removed  2003  . Orif radial fracture  2012    Left  . Fiberoptic bronchoscopy  2010  . Colonoscopy w/ polypectomy  2006    Dr. Jena Gauss: 3 diminutive polyps in descending colon, unknown path   . Esophagogastroduodenoscopy  2009    Dr. Jena Gauss: normal esophagus, small hiatal hernia, s/p empiric dilatilon   . Esophagogastroduodenoscopy  2003    Schatzki's ring s/p dilation    Current Outpatient Prescriptions  Medication Sig Dispense Refill  . albuterol (PROVENTIL HFA;VENTOLIN HFA) 108 (90 BASE) MCG/ACT inhaler Inhale 2 puffs into the lungs every 6 (six) hours as needed.    . ALPRAZolam (XANAX) 1 MG tablet Take 1 mg by mouth as directed. Take 1-2 as needed anxiety     . aspirin 81 MG tablet Take 81 mg by mouth as needed.    Marland Kitchen oxyCODONE-acetaminophen (PERCOCET/ROXICET) 5-325 MG per tablet Take 1 tablet by mouth every 4 (four) hours as needed.    . pantoprazole (PROTONIX) 40 MG tablet Take 40 mg by mouth daily.    Marland Kitchen PARoxetine (PAXIL) 10 MG tablet Take 10 mg by mouth every morning.    Marland Kitchen  promethazine (PHENERGAN) 25 MG tablet Take 25 mg by mouth every 6 (six) hours as needed.    Marland Kitchen dexlansoprazole (DEXILANT) 60 MG capsule Take 1 capsule (60 mg total) by mouth daily. 90 capsule 3  . polyethylene glycol-electrolytes (NULYTELY/GOLYTELY) 420 G solution Take 4,000 mLs by mouth once. 4000 mL 0   No current facility-administered medications for this visit.    Allergies as of 08/09/2015 - Review Complete 08/09/2015  Allergen Reaction Noted  . Morphine    . Penicillins  08/10/2012  . Rifampin  02/06/2010  . Tequin [gatifloxacin]  08/10/2012    Family  History  Problem Relation Age of Onset  . Emphysema Maternal Grandfather     was a smoker  . Asthma Sister   . Heart disease Sister     Also brother; cardiomyopathy  . Heart attack Father 30  . Colon cancer Neg Hx     Social History   Social History  . Marital Status: Married    Spouse Name: N/A  . Number of Children: 2  . Years of Education: N/A   Occupational History  . Automotive    Social History Main Topics  . Smoking status: Current Every Day Smoker -- 0.50 packs/day for 30 years    Types: Cigarettes  . Smokeless tobacco: Not on file     Comment: 09/2012: 4-5 cigerattes a week; using transdermal nicotine in quit attempt  . Alcohol Use: No  . Drug Use: No     Comment: Excessive caffeine- 10 cups of coffee per day  . Sexual Activity: Not on file   Other Topics Concern  . Not on file   Social History Narrative   2 stepchildren    Review of Systems: Gen: see HPI  CV: palpitations with panic attacks  Resp: +SOB, +DOE GI: see HPI  GU : Denies urinary burning, urinary frequency, urinary hesitancy MS: +joint pain, muscle weakness, history of MS  Derm: Denies rash, itching, dry skin Psych: +anxiety  Heme: Denies bruising, bleeding, and enlarged lymph nodes.  Physical Exam: BP 106/64 mmHg  Pulse 63  Temp(Src) 97.3 F (36.3 C) (Oral)  Ht  (1.803 m)  Wt 127 lb 9.6 oz (57.879 kg)  BMI 17.80 kg/m2 General:   Alert and oriented. Pleasant and cooperative. Well-nourished and well-developed.  Head:  Normocephalic and atraumatic. Eyes:  Without icterus, sclera clear and conjunctiva pink.  Ears:  Normal auditory acuity. Nose:  No deformity, discharge,  or lesions. Mouth:  No deformity or lesions, oral mucosa pink.  Lungs:  Clear to auscultation bilaterally. No wheezes, rales, or rhonchi. No distress.  Heart:  S1, S2 present without murmurs appreciated.  Abdomen:  +BS, soft, mild TTP lower abdomen and non-distended. No HSM noted. No guarding or rebound. No  masses appreciated.  Rectal:  Deferred  Msk:  Symmetrical without gross deformities. Normal posture. Extremities:  Without  edema. Neurologic:  Alert and  oriented x4;  grossly normal neurologically. Psych:  Alert and cooperative. Normal mood and affect.

## 2015-08-09 NOTE — Patient Instructions (Signed)
We have scheduled you for a colonoscopy, upper endoscopy, and possible dilation with Dr. Jena Gauss in the near future.  Start taking Dexilant one capsule each day. I have sent this to the pharmacy to see if we can get this covered for you. I've provided samples as well.  We may need to do a CT scan in the future.

## 2015-08-10 NOTE — Patient Instructions (Signed)
Sharon Gay  08/10/2015     @PREFPERIOPPHARMACY @   Your procedure is scheduled on 08/21/15.  Report to [name] at 6:15 A.M.  Call this number if you have problems the morning of surgery:  [phone]   Remember:  Do not eat food or drink liquids after midnight.  Take these medicines the morning of surgery with A SIP OF WATER - Albuterol inhaler (Also bring with you to hospital), Xanax, Dexilant, Oxycodone and Phenergan, if needed, Paxil   Do not wear jewelry, make-up or nail polish.  Do not wear lotions, powders, or perfumes.  You may wear deodorant.  Do not shave 48 hours prior to surgery.  Men may shave face and neck.  Do not bring valuables to the hospital.  [facility] is not responsible for any belongings or valuables.  Contacts, dentures or bridgework may not be worn into surgery.  Leave your suitcase in the car.  After surgery it may be brought to your room.  For patients admitted to the hospital, discharge time will be determined by your treatment team.  Patients discharged the day of surgery will not be allowed to drive home.    Please read over the following fact sheets that you were given. Anesthesia Post-op Instructions     PATIENT INSTRUCTIONS POST-ANESTHESIA  IMMEDIATELY FOLLOWING SURGERY:  Do not drive or operate machinery for the first twenty four hours after surgery.  Do not make any important decisions for twenty four hours after surgery or while taking narcotic pain medications or sedatives.  If you develop intractable nausea and vomiting or a severe headache please notify your doctor immediately.  FOLLOW-UP:  Please make an appointment with your surgeon as instructed. You do not need to follow up with anesthesia unless specifically instructed to do so.  WOUND CARE INSTRUCTIONS (if applicable):  Keep a dry clean dressing on the anesthesia/puncture wound site if there is drainage.  Once the wound has quit draining you may leave it open to air.   Generally you should leave the bandage intact for twenty four hours unless there is drainage.  If the epidural site drains for more than 36-48 hours please call the anesthesia department.  QUESTIONS?:  Please feel free to call your physician or the hospital operator if you have any questions, and they will be happy to assist you.      Esophageal Dilatation Esophageal dilatation is a procedure to open a blocked or narrowed part of the esophagus. The esophagus is the long tube in your throat that carries food and liquid from your mouth to your stomach. The procedure is also called esophageal dilation.  You may need this procedure if you have a buildup of scar tissue in your esophagus that makes it difficult, painful, or even impossible to swallow. This can be caused by gastroesophageal reflux disease (GERD). In rare cases, people need this procedure because they have cancer of the esophagus or a problem with the way food moves through the esophagus. Sometimes you may need to have another dilatation to enlarge the opening of the esophagus gradually. LET [facility] CARE PROVIDER KNOW ABOUT:   Any allergies you have.  All medicines you are taking, including vitamins, herbs, eye drops, creams, and over-the-counter medicines.  Previous problems you or members of your family have had with the use of anesthetics.  Any blood disorders you have.  Previous surgeries you have had.  Medical conditions you have.  Any antibiotic medicines you are required to take  before dental procedures. RISKS AND COMPLICATIONS Generally, this is a safe procedure. However, problems can occur and include:  Bleeding from a tear in the lining of the esophagus.  A hole (perforation) in the esophagus. BEFORE THE PROCEDURE  Do not eat or drink anything after midnight on the night before the procedure or as directed by your health care provider.  Ask your health care provider about changing or stopping your regular  medicines. This is especially important if you are taking diabetes medicines or blood thinners.  Plan to have someone take you home after the procedure. PROCEDURE   You will be given a medicine that makes you relaxed and sleepy (sedative).  A medicine may be sprayed or gargled to numb the back of the throat.  Your health care provider can use various instruments to do an esophageal dilatation. During the procedure, the instrument used will be placed in your mouth and passed down into your esophagus. Options include:  Simple dilators. This instrument is carefully placed in the esophagus to stretch it.  Guided wire bougies. In this method, a flexible tube (endoscope) is used to insert a wire into the esophagus. The dilator is passed over this wire to enlarge the esophagus. Then the wire is removed.  Balloon dilators. An endoscope with a small balloon at the end is passed down into the esophagus. Inflating the balloon gently stretches the esophagus and opens it up. AFTER THE PROCEDURE  Your blood pressure, heart rate, breathing rate, and blood oxygen level will be monitored often until the medicines you were given have worn off.  Your throat may feel slightly sore and will probably still feel numb. This will improve slowly over time.  You will not be allowed to eat or drink until the throat numbness has resolved.  If this is a same-day procedure, you may be allowed to go home once you have been able to drink, urinate, and sit on the edge of the bed without nausea or dizziness.  If this is a same-day procedure, you should have a friend or family member with you for the next 24 hours after the procedure.   This information is not intended to replace advice given to you by your health care provider. Make sure you discuss any questions you have with your health care provider.   Document Released: 10/17/2005 Document Revised: 09/16/2014 Document Reviewed: 01/05/2014 Elsevier Interactive  Patient Education 2016 [employer]. Esophagogastroduodenoscopy Esophagogastroduodenoscopy (EGD) is a procedure that is used to examine the lining of the esophagus, stomach, and first part of the small intestine (duodenum). A long, flexible, lighted tube with a camera attached (endoscope) is inserted down the throat to view these organs. This procedure is done to detect problems or abnormalities, such as inflammation, bleeding, ulcers, or growths, in order to treat them. The procedure lasts 5-20 minutes. It is usually an outpatient procedure, but it may need to be performed in a hospital in emergency cases. LET [facility] CARE PROVIDER KNOW ABOUT:  Any allergies you have.  All medicines you are taking, including vitamins, herbs, eye drops, creams, and over-the-counter medicines.  Previous problems you or members of your family have had with the use of anesthetics.  Any blood disorders you have.  Previous surgeries you have had.  Medical conditions you have. RISKS AND COMPLICATIONS Generally, this is a safe procedure. However, problems can occur and include:  Infection.  Bleeding.  Tearing (perforation) of the esophagus, stomach, or duodenum.  Difficulty breathing or not being able  to breathe.  Excessive sweating.  Spasms of the larynx.  Slowed heartbeat.  Low blood pressure. BEFORE THE PROCEDURE  Do not eat or drink anything after midnight on the night before the procedure or as directed by your health care provider.  Do not take your regular medicines before the procedure if your health care provider asks you not to. Ask your health care provider about changing or stopping those medicines.  If you wear dentures, be prepared to remove them before the procedure.  Arrange for someone to drive you home after the procedure. PROCEDURE  A numbing medicine (local anesthetic) may be sprayed in your throat for comfort and to stop you from gagging or coughing.  You will have an  IV tube inserted in a vein in your hand or arm. You will receive medicines and fluids through this tube.  You will be given a medicine to relax you (sedative).  A pain reliever will be given through the IV tube.  A mouth guard may be placed in your mouth to protect your teeth and to keep you from biting on the endoscope.  You will be asked to lie on your left side.  The endoscope will be inserted down your throat and into your esophagus, stomach, and duodenum.  Air will be put through the endoscope to allow your health care provider to clearly view the lining of your esophagus.  The lining of your esophagus, stomach, and duodenum will be examined. During the exam, your health care provider may:  Remove tissue to be examined under a microscope (biopsy) for inflammation, infection, or other medical problems.  Remove growths.  Remove objects (foreign bodies) that are stuck.  Treat any bleeding with medicines or other devices that stop tissues from bleeding (hot cautery, clipping devices).  Widen (dilate) or stretch narrowed areas of your esophagus and stomach.  The endoscope will be withdrawn. AFTER THE PROCEDURE  You will be taken to a recovery area for observation. Your blood pressure, heart rate, breathing rate, and blood oxygen level will be monitored often until the medicines you were given have worn off.  Do not eat or drink anything until the numbing medicine has worn off and your gag reflex has returned. You may choke.  Your health care provider should be able to discuss his or her findings with you. It will take longer to discuss the test results if any biopsies were taken.   This information is not intended to replace advice given to you by your health care provider. Make sure you discuss any questions you have with your health care provider.   Document Released: 12/27/2004 Document Revised: 09/16/2014 Document Reviewed: 07/29/2012 Elsevier Interactive Patient Education  2016 [employer]. Colonoscopy A colonoscopy is an exam to look at the entire large intestine (colon). This exam can help find problems such as tumors, polyps, inflammation, and areas of bleeding. The exam takes about 1 hour.  LET [facility] CARE PROVIDER KNOW ABOUT:   Any allergies you have.  All medicines you are taking, including vitamins, herbs, eye drops, creams, and over-the-counter medicines.  Previous problems you or members of your family have had with the use of anesthetics.  Any blood disorders you have.  Previous surgeries you have had.  Medical conditions you have. RISKS AND COMPLICATIONS  Generally, this is a safe procedure. However, as with any procedure, complications can occur. Possible complications include:  Bleeding.  Tearing or rupture of the colon wall.  Reaction to medicines given during the  exam.  Infection (rare). BEFORE THE PROCEDURE   Ask your health care provider about changing or stopping your regular medicines.  You may be prescribed an oral bowel prep. This involves drinking a large amount of medicated liquid, starting the day before your procedure. The liquid will cause you to have multiple loose stools until your stool is almost clear or light green. This cleans out your colon in preparation for the procedure.  Do not eat or drink anything else once you have started the bowel prep, unless your health care provider tells you it is safe to do so.  Arrange for someone to drive you home after the procedure. PROCEDURE   You will be given medicine to help you relax (sedative).  You will lie on your side with your knees bent.  A long, flexible tube with a light and camera on the end (colonoscope) will be inserted through the rectum and into the colon. The camera sends video back to a computer screen as it moves through the colon. The colonoscope also releases carbon dioxide gas to inflate the colon. This helps your health care provider see the area  better.  During the exam, your health care provider may take a small tissue sample (biopsy) to be examined under a microscope if any abnormalities are found.  The exam is finished when the entire colon has been viewed. AFTER THE PROCEDURE   Do not drive for 24 hours after the exam.  You may have a small amount of blood in your stool.  You may pass moderate amounts of gas and have mild abdominal cramping or bloating. This is caused by the gas used to inflate your colon during the exam.  Ask when your test results will be ready and how you will get your results. Make sure you get your test results.   This information is not intended to replace advice given to you by your health care provider. Make sure you discuss any questions you have with your health care provider.   Document Released: 08/23/2000 Document Revised: 06/16/2013 Document Reviewed: 05/03/2013 Elsevier Interactive Patient Education [employer]

## 2015-08-15 ENCOUNTER — Other Ambulatory Visit (HOSPITAL_COMMUNITY): Payer: BLUE CROSS/BLUE SHIELD

## 2015-08-15 NOTE — Patient Instructions (Signed)
[facility]  08/15/2015     @   Your procedure is scheduled on 08/21/15.  Report to [facility] at 6:15 A.M.  Call this number if you have problems the morning of surgery:  [phone]   Remember:  Do not eat food or drink liquids after midnight.  Take these medicines the morning of surgery with A SIP OF WATER Albuterol inhaler (Bring with you to hospital), Xanax, Dexilant or Protonix, Paxil, Phenergan.   Do not wear jewelry, make-up or nail polish.  Do not wear lotions, powders, or perfumes.  You may wear deodorant.  Do not shave 48 hours prior to surgery.  Men may shave face and neck.  Do not bring valuables to the hospital.  [facility] is not responsible for any belongings or valuables.  Contacts, dentures or bridgework may not be worn into surgery.  Leave your suitcase in the car.  After surgery it may be brought to your room.  For patients admitted to the hospital, discharge time will be determined by your treatment team.  Patients discharged the day of surgery will not be allowed to drive home.    Please read over the following fact sheets that you were given. Anesthesia Post-op Instructions     PATIENT INSTRUCTIONS POST-ANESTHESIA  IMMEDIATELY FOLLOWING SURGERY:  Do not drive or operate machinery for the first twenty four hours after surgery.  Do not make any important decisions for twenty four hours after surgery or while taking narcotic pain medications or sedatives.  If you develop intractable nausea and vomiting or a severe headache please notify your doctor immediately.  FOLLOW-UP:  Please make an appointment with your surgeon as instructed. You do not need to follow up with anesthesia unless specifically instructed to do so.  WOUND CARE INSTRUCTIONS (if applicable):  Keep a dry clean dressing on the anesthesia/puncture wound site if there is drainage.  Once the wound has quit draining you may leave it open to air.  Generally you should  leave the bandage intact for twenty four hours unless there is drainage.  If the epidural site drains for more than 36-48 hours please call the anesthesia department.  QUESTIONS?:  Please feel free to call your physician or the hospital operator if you have any questions, and they will be happy to assist you.      Esophageal Dilatation Esophageal dilatation is a procedure to open a blocked or narrowed part of the esophagus. The esophagus is the long tube in your throat that carries food and liquid from your mouth to your stomach. The procedure is also called esophageal dilation.  You may need this procedure if you have a buildup of scar tissue in your esophagus that makes it difficult, painful, or even impossible to swallow. This can be caused by gastroesophageal reflux disease (GERD). In rare cases, people need this procedure because they have cancer of the esophagus or a problem with the way food moves through the esophagus. Sometimes you may need to have another dilatation to enlarge the opening of the esophagus gradually. LET [facility] CARE PROVIDER KNOW ABOUT:   Any allergies you have.  All medicines you are taking, including vitamins, herbs, eye drops, creams, and over-the-counter medicines.  Previous problems you or members of your family have had with the use of anesthetics.  Any blood disorders you have.  Previous surgeries you have had.  Medical conditions you have.  Any antibiotic medicines you are required to take before dental procedures. RISKS  AND COMPLICATIONS Generally, this is a safe procedure. However, problems can occur and include:  Bleeding from a tear in the lining of the esophagus.  A hole (perforation) in the esophagus. BEFORE THE PROCEDURE  Do not eat or drink anything after midnight on the night before the procedure or as directed by your health care provider.  Ask your health care provider about changing or stopping your regular medicines. This is  especially important if you are taking diabetes medicines or blood thinners.  Plan to have someone take you home after the procedure. PROCEDURE   You will be given a medicine that makes you relaxed and sleepy (sedative).  A medicine may be sprayed or gargled to numb the back of the throat.  Your health care provider can use various instruments to do an esophageal dilatation. During the procedure, the instrument used will be placed in your mouth and passed down into your esophagus. Options include:  Simple dilators. This instrument is carefully placed in the esophagus to stretch it.  Guided wire bougies. In this method, a flexible tube (endoscope) is used to insert a wire into the esophagus. The dilator is passed over this wire to enlarge the esophagus. Then the wire is removed.  Balloon dilators. An endoscope with a small balloon at the end is passed down into the esophagus. Inflating the balloon gently stretches the esophagus and opens it up. AFTER THE PROCEDURE  Your blood pressure, heart rate, breathing rate, and blood oxygen level will be monitored often until the medicines you were given have worn off.  Your throat may feel slightly sore and will probably still feel numb. This will improve slowly over time.  You will not be allowed to eat or drink until the throat numbness has resolved.  If this is a same-day procedure, you may be allowed to go home once you have been able to drink, urinate, and sit on the edge of the bed without nausea or dizziness.  If this is a same-day procedure, you should have a friend or family member with you for the next 24 hours after the procedure.   This information is not intended to replace advice given to you by your health care provider. Make sure you discuss any questions you have with your health care provider.   Document Released: 10/17/2005 Document Revised: 09/16/2014 Document Reviewed: 01/05/2014 Elsevier Interactive Patient Education 2016  [employer]. Esophagogastroduodenoscopy Esophagogastroduodenoscopy (EGD) is a procedure that is used to examine the lining of the esophagus, stomach, and first part of the small intestine (duodenum). A long, flexible, lighted tube with a camera attached (endoscope) is inserted down the throat to view these organs. This procedure is done to detect problems or abnormalities, such as inflammation, bleeding, ulcers, or growths, in order to treat them. The procedure lasts 5-20 minutes. It is usually an outpatient procedure, but it may need to be performed in a hospital in emergency cases. LET [facility] CARE PROVIDER KNOW ABOUT:  Any allergies you have.  All medicines you are taking, including vitamins, herbs, eye drops, creams, and over-the-counter medicines.  Previous problems you or members of your family have had with the use of anesthetics.  Any blood disorders you have.  Previous surgeries you have had.  Medical conditions you have. RISKS AND COMPLICATIONS Generally, this is a safe procedure. However, problems can occur and include:  Infection.  Bleeding.  Tearing (perforation) of the esophagus, stomach, or duodenum.  Difficulty breathing or not being able to breathe.  Excessive  sweating.  Spasms of the larynx.  Slowed heartbeat.  Low blood pressure. BEFORE THE PROCEDURE  Do not eat or drink anything after midnight on the night before the procedure or as directed by your health care provider.  Do not take your regular medicines before the procedure if your health care provider asks you not to. Ask your health care provider about changing or stopping those medicines.  If you wear dentures, be prepared to remove them before the procedure.  Arrange for someone to drive you home after the procedure. PROCEDURE  A numbing medicine (local anesthetic) may be sprayed in your throat for comfort and to stop you from gagging or coughing.  You will have an IV tube inserted in a  vein in your hand or arm. You will receive medicines and fluids through this tube.  You will be given a medicine to relax you (sedative).  A pain reliever will be given through the IV tube.  A mouth guard may be placed in your mouth to protect your teeth and to keep you from biting on the endoscope.  You will be asked to lie on your left side.  The endoscope will be inserted down your throat and into your esophagus, stomach, and duodenum.  Air will be put through the endoscope to allow your health care provider to clearly view the lining of your esophagus.  The lining of your esophagus, stomach, and duodenum will be examined. During the exam, your health care provider may:  Remove tissue to be examined under a microscope (biopsy) for inflammation, infection, or other medical problems.  Remove growths.  Remove objects (foreign bodies) that are stuck.  Treat any bleeding with medicines or other devices that stop tissues from bleeding (hot cautery, clipping devices).  Widen (dilate) or stretch narrowed areas of your esophagus and stomach.  The endoscope will be withdrawn. AFTER THE PROCEDURE  You will be taken to a recovery area for observation. Your blood pressure, heart rate, breathing rate, and blood oxygen level will be monitored often until the medicines you were given have worn off.  Do not eat or drink anything until the numbing medicine has worn off and your gag reflex has returned. You may choke.  Your health care provider should be able to discuss his or her findings with you. It will take longer to discuss the test results if any biopsies were taken.   This information is not intended to replace advice given to you by your health care provider. Make sure you discuss any questions you have with your health care provider.   Document Released: 12/27/2004 Document Revised: 09/16/2014 Document Reviewed: 07/29/2012 Elsevier Interactive Patient Education 2016 [employer]. Colonoscopy A colonoscopy is an exam to look at the entire large intestine (colon). This exam can help find problems such as tumors, polyps, inflammation, and areas of bleeding. The exam takes about 1 hour.  LET [facility] CARE PROVIDER KNOW ABOUT:   Any allergies you have.  All medicines you are taking, including vitamins, herbs, eye drops, creams, and over-the-counter medicines.  Previous problems you or members of your family have had with the use of anesthetics.  Any blood disorders you have.  Previous surgeries you have had.  Medical conditions you have. RISKS AND COMPLICATIONS  Generally, this is a safe procedure. However, as with any procedure, complications can occur. Possible complications include:  Bleeding.  Tearing or rupture of the colon wall.  Reaction to medicines given during the exam.  Infection (rare).  BEFORE THE PROCEDURE   Ask your health care provider about changing or stopping your regular medicines.  You may be prescribed an oral bowel prep. This involves drinking a large amount of medicated liquid, starting the day before your procedure. The liquid will cause you to have multiple loose stools until your stool is almost clear or light green. This cleans out your colon in preparation for the procedure.  Do not eat or drink anything else once you have started the bowel prep, unless your health care provider tells you it is safe to do so.  Arrange for someone to drive you home after the procedure. PROCEDURE   You will be given medicine to help you relax (sedative).  You will lie on your side with your knees bent.  A long, flexible tube with a light and camera on the end (colonoscope) will be inserted through the rectum and into the colon. The camera sends video back to a computer screen as it moves through the colon. The colonoscope also releases carbon dioxide gas to inflate the colon. This helps your health care provider see the area  better.  During the exam, your health care provider may take a small tissue sample (biopsy) to be examined under a microscope if any abnormalities are found.  The exam is finished when the entire colon has been viewed. AFTER THE PROCEDURE   Do not drive for 24 hours after the exam.  You may have a small amount of blood in your stool.  You may pass moderate amounts of gas and have mild abdominal cramping or bloating. This is caused by the gas used to inflate your colon during the exam.  Ask when your test results will be ready and how you will get your results. Make sure you get your test results.   This information is not intended to replace advice given to you by your health care provider. Make sure you discuss any questions you have with your health care provider.   Document Released: 08/23/2000 Document Revised: 06/16/2013 Document Reviewed: 05/03/2013 Elsevier Interactive Patient Education [employer].

## 2015-08-16 ENCOUNTER — Telehealth: Payer: Self-pay | Admitting: Internal Medicine

## 2015-08-16 ENCOUNTER — Other Ambulatory Visit: Payer: Self-pay | Admitting: Neurology

## 2015-08-16 ENCOUNTER — Encounter (HOSPITAL_COMMUNITY)
Admission: RE | Admit: 2015-08-16 | Discharge: 2015-08-16 | Disposition: A | Discharge status: Home / Self Care | Payer: BLUE CROSS/BLUE SHIELD | Source: Ambulatory Visit | Attending: Internal Medicine | Admitting: Internal Medicine

## 2015-08-16 NOTE — Telephone Encounter (Signed)
FMLA papers on patient came today and I placed them in AS office (green folder) to be filled out and patient is aware of $29 charge.

## 2015-08-16 NOTE — Patient Instructions (Signed)
[facility]  08/16/2015     @   Your procedure is scheduled on 08/21/15.  [name].  Call this number if you have problems the morning of surgery:  [phone]   Remember:  Do not eat food or drink liquids after midnight.  Take these medicines the morning of surgery with A SIP OF WATER Albuterol inhaler (Bring with you to hospital), Xanax, Dexilant, Protonix, Paxil, Phenergan   Do not wear jewelry, make-up or nail polish.  Do not wear lotions, powders, or perfumes.  You may wear deodorant.  Do not shave 48 hours prior to surgery.  Men may shave face and neck.  Do not bring valuables to the hospital.  [facility] is not responsible for any belongings or valuables.  Contacts, dentures or bridgework may not be worn into surgery.  Leave your suitcase in the car.  After surgery it may be brought to your room.  For patients admitted to the hospital, discharge time will be determined by your treatment team.  Patients discharged the day of surgery will not be allowed to drive home.    Please read over the following fact sheets that you were given. Anesthesia Post-op Instructions     PATIENT INSTRUCTIONS POST-ANESTHESIA  IMMEDIATELY FOLLOWING SURGERY:  Do not drive or operate machinery for the first twenty four hours after surgery.  Do not make any important decisions for twenty four hours after surgery or while taking narcotic pain medications or sedatives.  If you develop intractable nausea and vomiting or a severe headache please notify your doctor immediately.  FOLLOW-UP:  Please make an appointment with your surgeon as instructed. You do not need to follow up with anesthesia unless specifically instructed to do so.  WOUND CARE INSTRUCTIONS (if applicable):  Keep a dry clean dressing on the anesthesia/puncture wound site if there is drainage.  Once the wound has quit draining you may leave it open to air.  Generally you should  leave the bandage intact for twenty four hours unless there is drainage.  If the epidural site drains for more than 36-48 hours please call the anesthesia department.  QUESTIONS?:  Please feel free to call your physician or the hospital operator if you have any questions, and they will be happy to assist you.      Colonoscopy A colonoscopy is an exam to look at the entire large intestine (colon). This exam can help find problems such as tumors, polyps, inflammation, and areas of bleeding. The exam takes about 1 hour.  LET [facility] CARE PROVIDER KNOW ABOUT:   Any allergies you have.  All medicines you are taking, including vitamins, herbs, eye drops, creams, and over-the-counter medicines.  Previous problems you or members of your family have had with the use of anesthetics.  Any blood disorders you have.  Previous surgeries you have had.  Medical conditions you have. RISKS AND COMPLICATIONS  Generally, this is a safe procedure. However, as with any procedure, complications can occur. Possible complications include:  Bleeding.  Tearing or rupture of the colon wall.  Reaction to medicines given during the exam.  Infection (rare). BEFORE THE PROCEDURE   Ask your health care provider about changing or stopping your regular medicines.  You may be prescribed an oral bowel prep. This involves drinking a large amount of medicated liquid, starting the day before your procedure. The liquid will cause you to have multiple loose stools until your stool is almost clear  or light green. This cleans out your colon in preparation for the procedure.  Do not eat or drink anything else once you have started the bowel prep, unless your health care provider tells you it is safe to do so.  Arrange for someone to drive you home after the procedure. PROCEDURE   You will be given medicine to help you relax (sedative).  You will lie on your side with your knees bent.  A long, flexible tube with  a light and camera on the end (colonoscope) will be inserted through the rectum and into the colon. The camera sends video back to a computer screen as it moves through the colon. The colonoscope also releases carbon dioxide gas to inflate the colon. This helps your health care provider see the area better.  During the exam, your health care provider may take a small tissue sample (biopsy) to be examined under a microscope if any abnormalities are found.  The exam is finished when the entire colon has been viewed. AFTER THE PROCEDURE   Do not drive for 24 hours after the exam.  You may have a small amount of blood in your stool.  You may pass moderate amounts of gas and have mild abdominal cramping or bloating. This is caused by the gas used to inflate your colon during the exam.  Ask when your test results will be ready and how you will get your results. Make sure you get your test results.   This information is not intended to replace advice given to you by your health care provider. Make sure you discuss any questions you have with your health care provider.   Document Released: 08/23/2000 Document Revised: 06/16/2013 Document Reviewed: 05/03/2013 Elsevier Interactive Patient Education 2016 [employer] Inc[employer] Esophagogastroduodenoscopy Esophagogastroduodenoscopy (EGD) is a procedure that is used to examine the lining of the esophagus, stomach, and first part of the small intestine (duodenum). A long, flexible, lighted tube with a camera attached (endoscope) is inserted down the throat to view these organs. This procedure is done to detect problems or abnormalities, such as inflammation, bleeding, ulcers, or growths, in order to treat them. The procedure lasts 5-20 minutes. It is usually an outpatient procedure, but it may need to be performed in a hospital in emergency cases. LET [facility] CARE PROVIDER KNOW ABOUT:  Any allergies you have.  All medicines you are taking, including vitamins,  herbs, eye drops, creams, and over-the-counter medicines.  Previous problems you or members of your family have had with the use of anesthetics.  Any blood disorders you have.  Previous surgeries you have had.  Medical conditions you have. RISKS AND COMPLICATIONS Generally, this is a safe procedure. However, problems can occur and include:  Infection.  Bleeding.  Tearing (perforation) of the esophagus, stomach, or duodenum.  Difficulty breathing or not being able to breathe.  Excessive sweating.  Spasms of the larynx.  Slowed heartbeat.  Low blood pressure. BEFORE THE PROCEDURE  Do not eat or drink anything after midnight on the night before the procedure or as directed by your health care provider.  Do not take your regular medicines before the procedure if your health care provider asks you not to. Ask your health care provider about changing or stopping those medicines.  If you wear dentures, be prepared to remove them before the procedure.  Arrange for someone to drive you home after the procedure. PROCEDURE  A numbing medicine (local anesthetic) may be sprayed in your throat for comfort and  to stop you from gagging or coughing.  You will have an IV tube inserted in a vein in your hand or arm. You will receive medicines and fluids through this tube.  You will be given a medicine to relax you (sedative).  A pain reliever will be given through the IV tube.  A mouth guard may be placed in your mouth to protect your teeth and to keep you from biting on the endoscope.  You will be asked to lie on your left side.  The endoscope will be inserted down your throat and into your esophagus, stomach, and duodenum.  Air will be put through the endoscope to allow your health care provider to clearly view the lining of your esophagus.  The lining of your esophagus, stomach, and duodenum will be examined. During the exam, your health care provider may:  Remove tissue to be  examined under a microscope (biopsy) for inflammation, infection, or other medical problems.  Remove growths.  Remove objects (foreign bodies) that are stuck.  Treat any bleeding with medicines or other devices that stop tissues from bleeding (hot cautery, clipping devices).  Widen (dilate) or stretch narrowed areas of your esophagus and stomach.  The endoscope will be withdrawn. AFTER THE PROCEDURE  You will be taken to a recovery area for observation. Your blood pressure, heart rate, breathing rate, and blood oxygen level will be monitored often until the medicines you were given have worn off.  Do not eat or drink anything until the numbing medicine has worn off and your gag reflex has returned. You may choke.  Your health care provider should be able to discuss his or her findings with you. It will take longer to discuss the test results if any biopsies were taken.   This information is not intended to replace advice given to you by your health care provider. Make sure you discuss any questions you have with your health care provider.   Document Released: 12/27/2004 Document Revised: 09/16/2014 Document Reviewed: 07/29/2012 Elsevier Interactive Patient Education [employer]

## 2015-08-17 ENCOUNTER — Encounter (HOSPITAL_COMMUNITY): Payer: Self-pay

## 2015-08-17 ENCOUNTER — Encounter (HOSPITAL_COMMUNITY)
Admission: RE | Admit: 2015-08-17 | Discharge: 2015-08-17 | Disposition: A | Discharge status: Home / Self Care | Payer: BLUE CROSS/BLUE SHIELD | Source: Ambulatory Visit | Attending: Internal Medicine | Admitting: Internal Medicine

## 2015-08-17 ENCOUNTER — Other Ambulatory Visit: Payer: Self-pay

## 2015-08-17 DIAGNOSIS — Z01818 Encounter for other preprocedural examination: Secondary | ICD-10-CM | POA: Diagnosis not present

## 2015-08-17 HISTORY — DX: Other disorders of lung: J98.4

## 2015-08-17 HISTORY — DX: Nausea with vomiting, unspecified: Z98.890

## 2015-08-17 HISTORY — DX: Other specified postprocedural states: R11.2

## 2015-08-17 LAB — BASIC METABOLIC PANEL
ANION GAP: 8 (ref 5–15)
BUN: 14 mg/dL (ref 6–20)
CALCIUM: 9.5 mg/dL (ref 8.9–10.3)
CO2: 27 mmol/L (ref 22–32)
Chloride: 104 mmol/L (ref 101–111)
Creatinine, Ser: 0.69 mg/dL (ref 0.44–1.00)
GFR calc Af Amer: >60 mL/min (ref >60)
Glucose, Bld: 83 mg/dL (ref 65–99)
POTASSIUM: 4.3 mmol/L (ref 3.5–5.1)
SODIUM: 139 mmol/L (ref 135–145)

## 2015-08-17 LAB — CBC
HEMATOCRIT: 43.4 % (ref 36.0–46.0)
HEMOGLOBIN: 15.1 g/dL — AB (ref 12.0–15.0)
MCH: 32.6 pg (ref 26.0–34.0)
MCHC: 34.8 g/dL (ref 30.0–36.0)
MCV: 93.7 fL (ref 78.0–100.0)
Platelets: 278 10*3/uL (ref 150–400)
RBC: 4.63 MIL/uL (ref 3.87–5.11)
RDW: 13.1 % (ref 11.5–15.5)
WBC: 11.8 10*3/uL — ABNORMAL HIGH (ref 4.0–10.5)

## 2015-08-20 ENCOUNTER — Encounter: Payer: Self-pay | Admitting: Gastroenterology

## 2015-08-20 NOTE — Assessment & Plan Note (Addendum)
49 year old female with persistent, vague lower abdominal pain noted as constant in nature, associated weight loss, and without any improvement with BMs. Appears constipation improved with taking herbal supplement and pain not improved with defecation. No rectal bleeding noted and no FH of colon cancer. History of polyps in 2006, path unknown. With history of polyps, weight loss, vague abdominal pain, would favor colonoscopy now.   Also notable persistent nausea, refractory GERD despite multiple trials of PPIs, solid food and pill dysphagia. Last EGD in 2009 with normal esophagus and remote history of Schatzki's ring in 2003.   Will proceed with TCS/EGD/dilation with Dr. Jena Gauss with Propofol due to polypharmacy. Risks and benefits discussed with stated understanding. Anticipate CT after TCS/EGD if no significant findings, especially in light of weight loss and abdominal pain. No acute findings on physical exam today.

## 2015-08-20 NOTE — Assessment & Plan Note (Signed)
Persistent GERD despite multiple PPI trials. Will trial Dexilant once daily. Prescription and samples provided.

## 2015-08-21 ENCOUNTER — Ambulatory Visit (HOSPITAL_COMMUNITY)
Admission: RE | Admit: 2015-08-21 | Payer: BLUE CROSS/BLUE SHIELD | Source: Ambulatory Visit | Admitting: Internal Medicine

## 2015-08-21 ENCOUNTER — Encounter (HOSPITAL_COMMUNITY): Admission: RE | Payer: Self-pay | Source: Ambulatory Visit

## 2015-08-21 SURGERY — COLONOSCOPY WITH PROPOFOL
Anesthesia: Monitor Anesthesia Care

## 2015-08-21 NOTE — Progress Notes (Signed)
CC'D TO PCP °

## 2015-08-22 ENCOUNTER — Other Ambulatory Visit: Payer: Self-pay

## 2015-08-22 MED ORDER — PEG 3350-KCL-NA BICARB-NACL 420 G PO SOLR: 4000.0000 mL | Freq: Once | ORAL | Status: DC

## 2015-08-23 ENCOUNTER — Encounter (HOSPITAL_COMMUNITY): Admission: RE | Admit: 2015-08-23 | Payer: BLUE CROSS/BLUE SHIELD | Source: Ambulatory Visit

## 2015-08-23 NOTE — Telephone Encounter (Signed)
Completed.

## 2015-08-24 ENCOUNTER — Emergency Department (HOSPITAL_COMMUNITY)
Admission: EM | Admit: 2015-08-24 | Discharge: 2015-08-24 | Disposition: A | Discharge status: Home / Self Care | Payer: BLUE CROSS/BLUE SHIELD | Source: Home / Self Care | Attending: Emergency Medicine | Admitting: Emergency Medicine

## 2015-08-24 ENCOUNTER — Encounter (HOSPITAL_COMMUNITY): Payer: Self-pay

## 2015-08-24 ENCOUNTER — Encounter (HOSPITAL_COMMUNITY): Payer: Self-pay | Admitting: Emergency Medicine

## 2015-08-24 ENCOUNTER — Emergency Department (HOSPITAL_COMMUNITY): Payer: BLUE CROSS/BLUE SHIELD

## 2015-08-24 ENCOUNTER — Inpatient Hospital Stay (HOSPITAL_COMMUNITY)
Admission: EM | Admit: 2015-08-24 | Discharge: 2015-08-28 | DRG: 871 | Disposition: A | Discharge status: Home / Self Care | Payer: BLUE CROSS/BLUE SHIELD | Attending: Internal Medicine | Admitting: Internal Medicine

## 2015-08-24 DIAGNOSIS — J449 Chronic obstructive pulmonary disease, unspecified: Secondary | ICD-10-CM | POA: Diagnosis present

## 2015-08-24 DIAGNOSIS — K219 Gastro-esophageal reflux disease without esophagitis: Secondary | ICD-10-CM | POA: Diagnosis present

## 2015-08-24 DIAGNOSIS — B2790 Infectious mononucleosis, unspecified without complication: Secondary | ICD-10-CM | POA: Diagnosis not present

## 2015-08-24 DIAGNOSIS — J189 Pneumonia, unspecified organism: Secondary | ICD-10-CM | POA: Diagnosis present

## 2015-08-24 DIAGNOSIS — G35 Multiple sclerosis: Secondary | ICD-10-CM | POA: Diagnosis present

## 2015-08-24 DIAGNOSIS — I959 Hypotension, unspecified: Secondary | ICD-10-CM

## 2015-08-24 DIAGNOSIS — A419 Sepsis, unspecified organism: Secondary | ICD-10-CM | POA: Diagnosis not present

## 2015-08-24 DIAGNOSIS — Z825 Family history of asthma and other chronic lower respiratory diseases: Secondary | ICD-10-CM

## 2015-08-24 DIAGNOSIS — A310 Pulmonary mycobacterial infection: Secondary | ICD-10-CM | POA: Diagnosis present

## 2015-08-24 DIAGNOSIS — E871 Hypo-osmolality and hyponatremia: Secondary | ICD-10-CM | POA: Diagnosis present

## 2015-08-24 DIAGNOSIS — F419 Anxiety disorder, unspecified: Secondary | ICD-10-CM | POA: Diagnosis present

## 2015-08-24 DIAGNOSIS — J42 Unspecified chronic bronchitis: Secondary | ICD-10-CM

## 2015-08-24 DIAGNOSIS — F17210 Nicotine dependence, cigarettes, uncomplicated: Secondary | ICD-10-CM | POA: Diagnosis present

## 2015-08-24 DIAGNOSIS — I341 Nonrheumatic mitral (valve) prolapse: Secondary | ICD-10-CM | POA: Diagnosis present

## 2015-08-24 DIAGNOSIS — R6889 Other general symptoms and signs: Secondary | ICD-10-CM

## 2015-08-24 DIAGNOSIS — R6510 Systemic inflammatory response syndrome (SIRS) of non-infectious origin without acute organ dysfunction: Secondary | ICD-10-CM | POA: Diagnosis present

## 2015-08-24 DIAGNOSIS — J440 Chronic obstructive pulmonary disease with acute lower respiratory infection: Secondary | ICD-10-CM | POA: Diagnosis present

## 2015-08-24 DIAGNOSIS — Z8249 Family history of ischemic heart disease and other diseases of the circulatory system: Secondary | ICD-10-CM

## 2015-08-24 DIAGNOSIS — R6521 Severe sepsis with septic shock: Secondary | ICD-10-CM | POA: Diagnosis present

## 2015-08-24 LAB — CBC WITH DIFFERENTIAL/PLATELET
Basophils Absolute: 0 10*3/uL (ref 0.0–0.1)
Basophils Relative: 0 %
Eosinophils Absolute: 0 10*3/uL (ref 0.0–0.7)
Eosinophils Relative: 0 %
HEMATOCRIT: 44.8 % (ref 36.0–46.0)
HEMOGLOBIN: 16.3 g/dL — AB (ref 12.0–15.0)
LYMPHS ABS: 1.2 10*3/uL (ref 0.7–4.0)
Lymphocytes Relative: 4 %
MCH: 33.1 pg (ref 26.0–34.0)
MCHC: 36.4 g/dL — AB (ref 30.0–36.0)
MCV: 90.9 fL (ref 78.0–100.0)
MONOS PCT: 4 %
Monocytes Absolute: 1.2 10*3/uL — ABNORMAL HIGH (ref 0.1–1.0)
NEUTROS ABS: 31.5 10*3/uL — AB (ref 1.7–7.7)
NEUTROS PCT: 93 %
Platelets: 241 10*3/uL (ref 150–400)
RBC: 4.93 MIL/uL (ref 3.87–5.11)
RDW: 12.7 % (ref 11.5–15.5)
WBC: 33.9 10*3/uL — ABNORMAL HIGH (ref 4.0–10.5)

## 2015-08-24 LAB — COMPREHENSIVE METABOLIC PANEL
ALBUMIN: 3.8 g/dL (ref 3.5–5.0)
ALT: 18 U/L (ref 14–54)
ANION GAP: 11 (ref 5–15)
AST: 18 U/L (ref 15–41)
Alkaline Phosphatase: 81 U/L (ref 38–126)
BUN: 20 mg/dL (ref 6–20)
CHLORIDE: 100 mmol/L — AB (ref 101–111)
CO2: 22 mmol/L (ref 22–32)
Calcium: 9.3 mg/dL (ref 8.9–10.3)
Creatinine, Ser: 0.72 mg/dL (ref 0.44–1.00)
GFR calc non Af Amer: >60 mL/min (ref >60)
Glucose, Bld: 114 mg/dL — ABNORMAL HIGH (ref 65–99)
Potassium: 3.3 mmol/L — ABNORMAL LOW (ref 3.5–5.1)
SODIUM: 133 mmol/L — AB (ref 135–145)
Total Bilirubin: 0.6 mg/dL (ref 0.3–1.2)
Total Protein: 7.8 g/dL (ref 6.5–8.1)

## 2015-08-24 LAB — URINALYSIS, ROUTINE W REFLEX MICROSCOPIC
BILIRUBIN URINE: NEGATIVE
Glucose, UA: NEGATIVE mg/dL
Leukocytes, UA: NEGATIVE
Nitrite: NEGATIVE
PH: 5.5 (ref 5.0–8.0)
Protein, ur: 30 mg/dL — AB

## 2015-08-24 LAB — URINE MICROSCOPIC-ADD ON: WBC, UA: NONE SEEN WBC/hpf (ref 0–5)

## 2015-08-24 LAB — RAPID STREP SCREEN (MED CTR MEBANE ONLY): Streptococcus, Group A Screen (Direct): NEGATIVE

## 2015-08-24 MED ORDER — HYDROCODONE-ACETAMINOPHEN 5-325 MG PO TABS: 1.0000 | ORAL_TABLET | Freq: Four times a day (QID) | ORAL | Status: DC | PRN

## 2015-08-24 MED ORDER — SODIUM CHLORIDE 0.9 % IV BOLUS (SEPSIS)
1000.0000 mL | Freq: Once | INTRAVENOUS | Status: AC
  Administered 2015-08-24: 1000 mL via INTRAVENOUS

## 2015-08-24 MED ORDER — HYDROMORPHONE HCL 1 MG/ML IJ SOLN
1.0000 mg | Freq: Once | INTRAMUSCULAR | Status: AC
  Administered 2015-08-24: 1 mg via INTRAVENOUS
  Filled 2015-08-24: qty 1

## 2015-08-24 MED ORDER — PROCHLORPERAZINE EDISYLATE 5 MG/ML IJ SOLN
10.0000 mg | Freq: Once | INTRAMUSCULAR | Status: AC
  Administered 2015-08-25: 10 mg via INTRAVENOUS
  Filled 2015-08-24: qty 2

## 2015-08-24 MED ORDER — DM-GUAIFENESIN ER 30-600 MG PO TB12: 1.0000 | ORAL_TABLET | Freq: Two times a day (BID) | ORAL | Status: DC

## 2015-08-24 MED ORDER — IBUPROFEN 800 MG PO TABS
800.0000 mg | ORAL_TABLET | Freq: Once | ORAL | Status: AC
  Administered 2015-08-25: 800 mg via ORAL
  Filled 2015-08-24: qty 1

## 2015-08-24 MED ORDER — PROMETHAZINE HCL 25 MG PO TABS: 25.0000 mg | ORAL_TABLET | Freq: Four times a day (QID) | ORAL | Status: DC | PRN

## 2015-08-24 MED ORDER — FENTANYL CITRATE (PF) 100 MCG/2ML IJ SOLN
50.0000 ug | Freq: Once | INTRAMUSCULAR | Status: AC
Start: 2015-08-24 — End: 2015-08-25
  Administered 2015-08-25: 50 ug via INTRAVENOUS
  Filled 2015-08-24: qty 2

## 2015-08-24 MED ORDER — ALBUTEROL SULFATE (2.5 MG/3ML) 0.083% IN NEBU
2.5000 mg | INHALATION_SOLUTION | Freq: Once | RESPIRATORY_TRACT | Status: AC
Start: 2015-08-24 — End: 2015-08-25
  Administered 2015-08-25: 2.5 mg via RESPIRATORY_TRACT
  Filled 2015-08-24: qty 3

## 2015-08-24 MED ORDER — ONDANSETRON HCL 4 MG/2ML IJ SOLN
4.0000 mg | Freq: Once | INTRAMUSCULAR | Status: AC
  Administered 2015-08-24: 4 mg via INTRAVENOUS
  Filled 2015-08-24: qty 2

## 2015-08-24 MED ORDER — SODIUM CHLORIDE 0.9 % IV BOLUS (SEPSIS)
1000.0000 mL | Freq: Once | INTRAVENOUS | Status: AC
Start: 2015-08-24 — End: 2015-08-24
  Administered 2015-08-24: 1000 mL via INTRAVENOUS

## 2015-08-24 MED ORDER — SODIUM CHLORIDE 0.9 % IV SOLN: INTRAVENOUS | Status: DC

## 2015-08-24 MED ORDER — ACETAMINOPHEN 325 MG PO TABS
650.0000 mg | ORAL_TABLET | Freq: Once | ORAL | Status: AC
  Administered 2015-08-24: 650 mg via ORAL
  Filled 2015-08-24: qty 2

## 2015-08-24 NOTE — ED Provider Notes (Signed)
CSN: [id number]     Arrival date & time 08/24/15  4098 History  By signing my name below, I, Gwenyth Ober, attest that this documentation has been prepared under the direction and in the presence of Vanetta Mulders, MD.  Electronically Signed: Gwenyth Ober, ED Scribe. 08/24/2015. 10:55 AM.  Chief Complaint  Patient presents with  . Fever   Patient is a 49 y.o. female presenting with general illness. The history is provided by the patient. No language interpreter was used.  Illness Location:  Generalized body aches Severity:  Moderate Onset quality:  Gradual Duration:  2 days Timing:  Constant Progression:  Worsening Chronicity:  New Associated symptoms: congestion, cough, headaches, shortness of breath and sore throat   Associated symptoms: no abdominal pain, no chest pain, no diarrhea, no fever, no nausea, no rash, no rhinorrhea and no vomiting     HPI Comments: Sharon Gay is a 49 y.o. female with a history of MS who presents to the Emergency Department complaining of constant, moderate generalized body aches that started 2 days ago. Pt states sore throat, cough, HA and chills as associated symptoms. She reports current HA is consistent with prior migraines, which were treated with Demerol IM. Pt is currently being treated with an unspecified antibiotic for an unspecified abdominal infection. She had a flu shot this year. Pt denies fever, nausea and vomiting.   PCP [place]  Past Medical History  Diagnosis Date  . Anxiety   . Migraines   . COPD (chronic obstructive pulmonary disease) (HCC)     11/2003: Normal PFTs ex minimal increased volumes and decreased the DLCO; Followed in Pulmonary clinic/ [facility]/ [place]   . Chest pain 2003    11/2007: Admission with unstable angina; normal coronary angiography  with [facility]; nl EF  . Mycobacterium avium complex (HCC) 2010    01/2009: + AFB stain on bronchial washings  . Gastroesophageal reflux disease  2009    Hiatal hernia; esophageal dilatation for Schatzki's ring in 04/2008; chronic constipation; irritable bowel syndrome  . Colitis 2005  . Hyperlipidemia 2009    Lipid profile 07/2012:227, 91, 56, 153  . Mitral valve prolapse   . MS (multiple sclerosis) (HCC)   . Mitral valve prolapse   . Lung disease 2010  . MS (multiple sclerosis) (HCC) 2001  . PONV (postoperative nausea and vomiting)    Past Surgical History  Procedure Laterality Date  . Vocal cord polyp removed  2003  . Orif radial fracture  2012    Left  . Fiberoptic bronchoscopy  2010  . Colonoscopy w/ polypectomy  2006    Dr. Jena Gauss: 3 diminutive polyps in descending colon, unknown path   . Esophagogastroduodenoscopy  2009    Dr. Jena Gauss: normal esophagus, small hiatal hernia, s/p empiric dilatilon   . Esophagogastroduodenoscopy  2003    Schatzki's ring s/p dilation   Family History  Problem Relation Age of Onset  . Emphysema Maternal Grandfather     was a smoker  . Asthma Sister   . Heart disease Sister     Also brother; cardiomyopathy  . Heart attack Father 83  . Colon cancer Neg Hx    Social History  Substance Use Topics  . Smoking status: Current Every Day Smoker -- 0.50 packs/day for 30 years    Types: Cigarettes  . Smokeless tobacco: None     Comment: 09/2012: 4-5 cigerattes a week; using transdermal nicotine in quit attempt  . Alcohol Use: No  OB History    No data available     Review of Systems  Constitutional: Positive for chills. Negative for fever.  HENT: Positive for congestion and sore throat. Negative for rhinorrhea.   Eyes: Positive for visual disturbance.  Respiratory: Positive for cough and shortness of breath.   Cardiovascular: Negative for chest pain and leg swelling.  Gastrointestinal: Negative for nausea, vomiting, abdominal pain and diarrhea.  Genitourinary: Negative for dysuria and hematuria.  Musculoskeletal: Negative for back pain and neck pain.  Skin: Negative for rash.   Neurological: Positive for dizziness and headaches.  Hematological: Does not bruise/bleed easily.  Psychiatric/Behavioral: Negative for confusion.   Allergies  Penicillins; Morphine; Rifampin; and Tequin  Home Medications   Prior to Admission medications   Medication Sig Start Date End Date Taking? Authorizing Provider  albuterol (PROVENTIL HFA;VENTOLIN HFA) 108 (90 BASE) MCG/ACT inhaler Inhale 2 puffs into the lungs every 6 (six) hours as needed.   Yes Historical Provider, MD  ALPRAZolam Prudy Feeler) 1 MG tablet Take 1 mg by mouth as directed. Take 1-2 as needed anxiety    Yes Historical Provider, MD  aspirin 81 MG tablet Take 81 mg by mouth daily.    Yes Historical Provider, MD  dexlansoprazole (DEXILANT) 60 MG capsule Take 1 capsule (60 mg total) by mouth daily. 08/09/15  Yes Nira Retort, NP  oxyCODONE-acetaminophen (PERCOCET/ROXICET) 5-325 MG per tablet Take 1 tablet by mouth every 4 (four) hours as needed for moderate pain.    Yes Historical Provider, MD  pantoprazole (PROTONIX) 40 MG tablet Take 40 mg by mouth daily.   Yes Historical Provider, MD  PARoxetine (PAXIL) 20 MG tablet Take 20 mg by mouth daily.   Yes Historical Provider, MD  promethazine (PHENERGAN) 25 MG tablet Take 25 mg by mouth every 6 (six) hours as needed for nausea or vomiting.    Yes Historical Provider, MD  dextromethorphan-guaiFENesin (MUCINEX DM) 30-600 MG 12hr tablet Take 1 tablet by mouth 2 (two) times daily. 08/24/15   Vanetta Mulders, MD  HYDROcodone-acetaminophen (NORCO/VICODIN) 5-325 MG tablet Take 1-2 tablets by mouth every 6 (six) hours as needed. 08/24/15   Vanetta Mulders, MD  polyethylene glycol-electrolytes (NULYTELY/GOLYTELY) 420 G solution Take 4,000 mLs by mouth once. 08/22/15   Nira Retort, NP  promethazine (PHENERGAN) 25 MG tablet Take 1 tablet (25 mg total) by mouth every 6 (six) hours as needed. 08/24/15   Vanetta Mulders, MD   BP 108/68 mmHg  Pulse 78  Temp(Src) 98.7 F (37.1 C) (Oral)  Resp 17   Ht  (1.803 m)  Wt 57.153 kg  BMI 17.58 kg/m2  SpO2 93% Physical Exam  Constitutional: She is oriented to person, place, and time. She appears well-developed and well-nourished. No distress.  HENT:  Head: Normocephalic and atraumatic.  Mouth/Throat: Posterior oropharyngeal erythema present. No oropharyngeal exudate.  Mucous membranes dry  Coating on tongue  Eyes: Conjunctivae and EOM are normal. Pupils are equal, round, and reactive to light.  Sclera clear  Eyes track normally  Neck: Neck supple. No tracheal deviation present.  Cardiovascular: Tachycardia present.   No murmur heard. Pulmonary/Chest: Effort normal and breath sounds normal. No respiratory distress.  Lungs clear bilaterally  Abdominal: Bowel sounds are normal. She exhibits no distension. There is no tenderness.  Musculoskeletal: She exhibits no edema.  Neurological: She is alert and oriented to person, place, and time. No cranial nerve deficit. She exhibits normal muscle tone. Coordination normal.  Skin: Skin is warm and dry.  Psychiatric:  She has a normal mood and affect. Her behavior is normal.  Nursing note and vitals reviewed.   ED Course  Procedures  DIAGNOSTIC STUDIES: Oxygen Saturation is 99% on RA, normal by my interpretation.    COORDINATION OF CARE: 11:01 AM Discussed treatment plan with pt which includes IV fluids, chest x-ray and lab work. Pt agreed to plan.  Labs Review Labs Reviewed  COMPREHENSIVE METABOLIC PANEL - Abnormal; Notable for the following:    Sodium 133 (*)    Potassium 3.3 (*)    Chloride 100 (*)    Glucose, Bld 114 (*)    All other components within normal limits  CBC WITH DIFFERENTIAL/PLATELET - Abnormal; Notable for the following:    WBC 33.9 (*)    Hemoglobin 16.3 (*)    MCHC 36.4 (*)    Neutro Abs 31.5 (*)    Monocytes Absolute 1.2 (*)    All other components within normal limits  URINALYSIS, ROUTINE W REFLEX MICROSCOPIC (NOT AT [facility]) - Abnormal; Notable for the  following:    Specific Gravity, Urine >1.030 (*)    Hgb urine dipstick SMALL (*)    Ketones, ur TRACE (*)    Protein, ur 30 (*)    All other components within normal limits  URINE MICROSCOPIC-ADD ON - Abnormal; Notable for the following:    Squamous Epithelial / LPF 0-5 (*)    Bacteria, UA MANY (*)    All other components within normal limits  RAPID STREP SCREEN (NOT AT [facility])  CULTURE, GROUP A STREP   Results for orders placed or performed during the hospital encounter of 08/24/15  Rapid strep screen (not at [facility])  Result Value Ref Range   Streptococcus, Group A Screen (Direct) NEGATIVE NEGATIVE  Comprehensive metabolic panel  Result Value Ref Range   Sodium 133 (L) 135 - 145 mmol/L   Potassium 3.3 (L) 3.5 - 5.1 mmol/L   Chloride 100 (L) 101 - 111 mmol/L   CO2 22 22 - 32 mmol/L   Glucose, Bld 114 (H) 65 - 99 mg/dL   BUN 20 6 - 20 mg/dL   Creatinine, Ser 1.61 0.44 - 1.00 mg/dL   Calcium 9.3 8.9 - 09.6 mg/dL   Total Protein 7.8 6.5 - 8.1 g/dL   Albumin 3.8 3.5 - 5.0 g/dL   AST 18 15 - 41 U/L   ALT 18 14 - 54 U/L   Alkaline Phosphatase 81 38 - 126 U/L   Total Bilirubin 0.6 0.3 - 1.2 mg/dL   GFR calc non Af Amer >60 >60 mL/min   GFR calc Af Amer >60 >60 mL/min   Anion gap 11 5 - 15  CBC with Differential/Platelet  Result Value Ref Range   WBC 33.9 (H) 4.0 - 10.5 K/uL   RBC 4.93 3.87 - 5.11 MIL/uL   Hemoglobin 16.3 (H) 12.0 - 15.0 g/dL   HCT 04.5 40.9 - 81.1 %   MCV 90.9 78.0 - 100.0 fL   MCH 33.1 26.0 - 34.0 pg   MCHC 36.4 (H) 30.0 - 36.0 g/dL   RDW 91.4 78.2 - 95.6 %   Platelets 241 150 - 400 K/uL   Neutrophils Relative % 93 %   Neutro Abs 31.5 (H) 1.7 - 7.7 K/uL   Lymphocytes Relative 4 %   Lymphs Abs 1.2 0.7 - 4.0 K/uL   Monocytes Relative 4 %   Monocytes Absolute 1.2 (H) 0.1 - 1.0 K/uL   Eosinophils Relative 0 %   Eosinophils Absolute 0.0 0.0 -  0.7 K/uL   Basophils Relative 0 %   Basophils Absolute 0.0 0.0 - 0.1 K/uL  Urinalysis, Routine w reflex microscopic  (not at [facility])  Result Value Ref Range   Color, Urine YELLOW YELLOW   APPearance CLEAR CLEAR   Specific Gravity, Urine >1.030 (H) 1.005 - 1.030   pH 5.5 5.0 - 8.0   Glucose, UA NEGATIVE NEGATIVE mg/dL   Hgb urine dipstick SMALL (A) NEGATIVE   Bilirubin Urine NEGATIVE NEGATIVE   Ketones, ur TRACE (A) NEGATIVE mg/dL   Protein, ur 30 (A) NEGATIVE mg/dL   Nitrite NEGATIVE NEGATIVE   Leukocytes, UA NEGATIVE NEGATIVE  Urine microscopic-add on  Result Value Ref Range   Squamous Epithelial / LPF 0-5 (A) NONE SEEN   WBC, UA NONE SEEN 0 - 5 WBC/hpf   RBC / HPF 0-5 0 - 5 RBC/hpf   Bacteria, UA MANY (A) NONE SEEN     Imaging Review Dg Chest 2 View  08/24/2015  CLINICAL DATA:  Cough, congestion, fever, body aches and chills since 08/22/2015, smoker, COPD, multiple sclerosis EXAM: CHEST  2 VIEW COMPARISON:  06/14/2015 FINDINGS: Normal heart size, mediastinal contours, and pulmonary vascularity. Lungs emphysematous with minimal central peribronchial thickening compatible with COPD. No infiltrate, pleural effusion or pneumothorax. Osseous structures unremarkable. IMPRESSION: COPD changes. No acute abnormalities. Electronically Signed   By: Ulyses Southward M.D.   On: 08/24/2015 12:32   I have personally reviewed and evaluated these images and lab results as part of my medical decision-making.   EKG Interpretation None          MDM   Final diagnoses:  Flu-like symptoms   Patient's illness and symptoms is very suggestive of the flu. She's been symptomatic for more than 2 days so antivirals will probably not be of much benefit. Patient improved her significantly with 3 L of normal saline hydration urine was very concentrated patient looked dry. Patient overall feeling better. Chest x-rays negative for pneumonia urinalysis without evidence of urinary tract infection the CBC showed a Mark leukocytosis some of that could've been from hemoconcentration. However the differential is suggestive of an  acute illness not a leukemia. However follow-up with this will need to be done when patient is improved to make sure it returns to normal. Electrolytes without evidence of significant dehydration although mild hyponatremia was present. Liver function tests were normal. Patient will be treated symptomatically with precautions to return for any new or worse symptoms at all.  Patient's vital signs heart rate now is around 70 his never been hypotensive lowest blood pressures been 106 systolic highest temperature here is been 100F. Patient without any septic parameters.  I personally performed the services described in this documentation, which was scribed in my presence. The recorded information has been reviewed and is accurate.      Vanetta Mulders, MD 08/24/15 1505

## 2015-08-24 NOTE — ED Notes (Signed)
Pt is requesting something for pain  

## 2015-08-24 NOTE — ED Notes (Signed)
Pt reports fever, h/a, chills, muscle soreness since Tuesday.  Pt also reports cough. Pt wearing mask at this time.

## 2015-08-24 NOTE — ED Notes (Signed)
Discharge instructions given to pt , verbalized understanding.  Discussed use of pain meds , and drinking plenty of fluids and resting, Work note given . Pt ambulated off unit

## 2015-08-24 NOTE — ED Provider Notes (Signed)
CSN: [id number]     Arrival date & time 08/24/15  2256 History   First MD Initiated Contact with Patient 08/24/15 2304     Chief Complaint  Patient presents with  . Generalized Body Aches     (Consider location/radiation/quality/duration/timing/severity/associated sxs/prior Treatment) HPI Comments: Patient is a 49 year old female with a history of chronic obstructive pulmonary disease, gastroesophageal reflux disease, multiple sclerosis, and colitis who presents to the emergency department with a complaint of cough, back pain, lung pain, and extremity pain.  The patient states that these symptoms started on Tuesday, December 13. With nasal congestion and cough. She states that she's had a maximum temperature of 100.3. She's had some nausea but no actual vomiting. She's had dizziness, body aching, and a sensation of chills or being cold most of the time. On yesterday she started having problems with pain in her back and pain down her legs. She presented to the emergency department where she was worked up and it was felt that she probably had influenza. The patient states she received IV fluids in the emergency department felt a lot better, but shortly after arriving home she noticed that all the symptoms came back, and she feels that some of them may actually be worse. She does not recall being around anyone is been ill. She's not been out of the country recently. It is of note that the patient has been on antibiotics for a "stomach infection". The patient states she is not sure which antibiotics she was on.  The history is provided by the patient.    Past Medical History  Diagnosis Date  . Anxiety   . Migraines   . COPD (chronic obstructive pulmonary disease) (HCC)     11/2003: Normal PFTs ex minimal increased volumes and decreased the DLCO; Followed in Pulmonary clinic/ [facility]/ [place]   . Chest pain 2003    11/2007: Admission with unstable angina; normal coronary angiography  with  [facility]; nl EF  . Mycobacterium avium complex (HCC) 2010    01/2009: + AFB stain on bronchial washings  . Gastroesophageal reflux disease 2009    Hiatal hernia; esophageal dilatation for Schatzki's ring in 04/2008; chronic constipation; irritable bowel syndrome  . Colitis 2005  . Hyperlipidemia 2009    Lipid profile 07/2012:227, 91, 56, 153  . Mitral valve prolapse   . MS (multiple sclerosis) (HCC)   . Mitral valve prolapse   . Lung disease 2010  . MS (multiple sclerosis) (HCC) 2001  . PONV (postoperative nausea and vomiting)    Past Surgical History  Procedure Laterality Date  . Vocal cord polyp removed  2003  . Orif radial fracture  2012    Left  . Fiberoptic bronchoscopy  2010  . Colonoscopy w/ polypectomy  2006    Dr. Jena Gauss: 3 diminutive polyps in descending colon, unknown path   . Esophagogastroduodenoscopy  2009    Dr. Jena Gauss: normal esophagus, small hiatal hernia, s/p empiric dilatilon   . Esophagogastroduodenoscopy  2003    Schatzki's ring s/p dilation   Family History  Problem Relation Age of Onset  . Emphysema Maternal Grandfather     was a smoker  . Asthma Sister   . Heart disease Sister     Also brother; cardiomyopathy  . Heart attack Father 28  . Colon cancer Neg Hx    Social History  Substance Use Topics  . Smoking status: Current Every Day Smoker -- 0.50 packs/day for 30 years    Types:  Cigarettes  . Smokeless tobacco: None     Comment: 09/2012: 4-5 cigerattes a week; using transdermal nicotine in quit attempt  . Alcohol Use: No   OB History    No data available     Review of Systems  Constitutional: Positive for fever, chills and appetite change.  HENT: Positive for congestion, ear pain, postnasal drip and sinus pressure. Negative for trouble swallowing.   Respiratory: Positive for shortness of breath and wheezing.   Gastrointestinal: Positive for nausea. Negative for vomiting and diarrhea.  Musculoskeletal: Positive for back  pain and arthralgias.  Skin: Positive for rash.  Neurological: Positive for dizziness.  Psychiatric/Behavioral: The patient is nervous/anxious.   All other systems reviewed and are negative.     Allergies  Penicillins; Morphine; Rifampin; and Tequin  Home Medications   Prior to Admission medications   Medication Sig Start Date End Date Taking? Authorizing Provider  albuterol (PROVENTIL HFA;VENTOLIN HFA) 108 (90 BASE) MCG/ACT inhaler Inhale 2 puffs into the lungs every 6 (six) hours as needed.    Historical Provider, MD  ALPRAZolam Prudy Feeler) 1 MG tablet Take 1 mg by mouth as directed. Take 1-2 as needed anxiety     Historical Provider, MD  aspirin 81 MG tablet Take 81 mg by mouth daily.     Historical Provider, MD  dexlansoprazole (DEXILANT) 60 MG capsule Take 1 capsule (60 mg total) by mouth daily. 08/09/15   Nira Retort, NP  dextromethorphan-guaiFENesin [facility] DM) 30-600 MG 12hr tablet Take 1 tablet by mouth 2 (two) times daily. 08/24/15   Vanetta Mulders, MD  HYDROcodone-acetaminophen (NORCO/VICODIN) 5-325 MG tablet Take 1-2 tablets by mouth every 6 (six) hours as needed. 08/24/15   Vanetta Mulders, MD  oxyCODONE-acetaminophen (PERCOCET/ROXICET) 5-325 MG per tablet Take 1 tablet by mouth every 4 (four) hours as needed for moderate pain.     Historical Provider, MD  pantoprazole (PROTONIX) 40 MG tablet Take 40 mg by mouth daily.    Historical Provider, MD  PARoxetine (PAXIL) 20 MG tablet Take 20 mg by mouth daily.    Historical Provider, MD  polyethylene glycol-electrolytes (NULYTELY/GOLYTELY) 420 G solution Take 4,000 mLs by mouth once. 08/22/15   Nira Retort, NP  promethazine (PHENERGAN) 25 MG tablet Take 25 mg by mouth every 6 (six) hours as needed for nausea or vomiting.     Historical Provider, MD  promethazine (PHENERGAN) 25 MG tablet Take 1 tablet (25 mg total) by mouth every 6 (six) hours as needed. 08/24/15   Vanetta Mulders, MD   BP 121/73 mmHg  Pulse 97  Temp(Src) 100.6 F  (38.1 C) (Oral)  Resp 20  Ht 5\' 10"  (1.778 m)  Wt 57.153 kg  BMI 18.08 kg/m2  SpO2 98% Physical Exam  Constitutional: She is oriented to person, place, and time. She appears well-developed and well-nourished.  Non-toxic appearance.  HENT:  Head: Normocephalic.  Right Ear: Tympanic membrane and external ear normal.  Left Ear: Tympanic membrane and external ear normal.  Nasal congestion present. There is increased redness of the posterior pharynx. The uvula is enlarged, but midline. The airway is patent.  Eyes: EOM and lids are normal. Pupils are equal, round, and reactive to light.  Neck: Normal range of motion. Neck supple. Carotid bruit is not present.  Cardiovascular: Normal rate, regular rhythm, normal heart sounds, intact distal pulses and normal pulses.  Exam reveals no friction rub.   No murmur heard. Pulmonary/Chest: No respiratory distress. She has wheezes. She has rhonchi.  Abdominal: Soft.  Bowel sounds are normal. She exhibits no distension. There is no tenderness. There is no guarding.  Right lower flank area pain.  Musculoskeletal: Normal range of motion. She exhibits no edema.  Lymphadenopathy:       Head (right side): No submandibular, no preauricular and no posterior auricular adenopathy present.       Head (left side): No submandibular, no preauricular and no posterior auricular adenopathy present.    She has cervical adenopathy.  Neurological: She is alert and oriented to person, place, and time. She has normal strength. No cranial nerve deficit or sensory deficit.  Skin: Skin is warm and dry.  Psychiatric: Her speech is normal. Her mood appears anxious.  Nursing note and vitals reviewed.   ED Course  Procedures (including critical care time) Labs Review Labs Reviewed  CULTURE, BLOOD (ROUTINE X 2)  CULTURE, BLOOD (ROUTINE X 2)  COMPREHENSIVE METABOLIC PANEL  CBC WITH DIFFERENTIAL/PLATELET  MONONUCLEOSIS SCREEN    Imaging Review Dg Chest 2  View  08/24/2015  CLINICAL DATA:  Cough, congestion, fever, body aches and chills since 08/22/2015, smoker, COPD, multiple sclerosis EXAM: CHEST  2 VIEW COMPARISON:  06/14/2015 FINDINGS: Normal heart size, mediastinal contours, and pulmonary vascularity. Lungs emphysematous with minimal central peribronchial thickening compatible with COPD. No infiltrate, pleural effusion or pneumothorax. Osseous structures unremarkable. IMPRESSION: COPD changes. No acute abnormalities. Electronically Signed   By: Ulyses Southward M.D.   On: 08/24/2015 12:32   I have personally reviewed and evaluated these images and lab results as part of my medical decision-making.   EKG Interpretation None      MDM  Previous ED visits reviewed. Labs reviewed.  MOno Spot POSITIVE. No left shift on cbc. E'lytes wnl. Chest xray reveals COPD/emphysematous changes only. Pt pain much improved after IV pain meds. Discussed exam and lab findings with patient. Rx for norco, albuterol and ibuprofen given to the patient. Pt to call MD today for Monday 12/19 follow up appointment. Pt to return to ED if any changes or problem.  At d/c nursing reports that patient's blood pressure is 85 to 76 systolic. Suspect response to IV pain meds. Liter of fluids ordered. Pt to be rechecked after bolus.   Final diagnoses:  None    *I have reviewed nursing notes, vital signs, and all appropriate lab and imaging results for this patient.[address] PA-C 08/25/15 8657  Devoria Albe, MD 08/25/15 8469  Devoria Albe, MD 08/25/15 [phone]

## 2015-08-24 NOTE — Discharge Instructions (Signed)
Return for any new or worse symptoms. Illness does seem to be flulike. Not a good candidate for Tamiflu since symptoms been ongoing for more than a couple days. As we discussed white blood cell count was elevated follow-up with your regular Dr. make sure that comes back to normal. Take medications as directed.

## 2015-08-24 NOTE — ED Notes (Signed)
Pt states she was diagnosed with the flu earlier today, states she is still feeling bad, body aches etc.  Last tylenol was at 6 pm, No ibuprofen

## 2015-08-24 NOTE — ED Notes (Signed)
Temp not obtained as pt drinking soda-

## 2015-08-25 ENCOUNTER — Observation Stay (HOSPITAL_COMMUNITY): Payer: BLUE CROSS/BLUE SHIELD

## 2015-08-25 DIAGNOSIS — K219 Gastro-esophageal reflux disease without esophagitis: Secondary | ICD-10-CM

## 2015-08-25 DIAGNOSIS — F419 Anxiety disorder, unspecified: Secondary | ICD-10-CM | POA: Diagnosis present

## 2015-08-25 DIAGNOSIS — J449 Chronic obstructive pulmonary disease, unspecified: Secondary | ICD-10-CM | POA: Diagnosis present

## 2015-08-25 DIAGNOSIS — Z8249 Family history of ischemic heart disease and other diseases of the circulatory system: Secondary | ICD-10-CM | POA: Diagnosis not present

## 2015-08-25 DIAGNOSIS — F17210 Nicotine dependence, cigarettes, uncomplicated: Secondary | ICD-10-CM | POA: Diagnosis present

## 2015-08-25 DIAGNOSIS — I341 Nonrheumatic mitral (valve) prolapse: Secondary | ICD-10-CM | POA: Diagnosis present

## 2015-08-25 DIAGNOSIS — J189 Pneumonia, unspecified organism: Secondary | ICD-10-CM

## 2015-08-25 DIAGNOSIS — R6510 Systemic inflammatory response syndrome (SIRS) of non-infectious origin without acute organ dysfunction: Secondary | ICD-10-CM

## 2015-08-25 DIAGNOSIS — B2790 Infectious mononucleosis, unspecified without complication: Secondary | ICD-10-CM | POA: Diagnosis present

## 2015-08-25 DIAGNOSIS — Z825 Family history of asthma and other chronic lower respiratory diseases: Secondary | ICD-10-CM | POA: Diagnosis not present

## 2015-08-25 DIAGNOSIS — J440 Chronic obstructive pulmonary disease with acute lower respiratory infection: Secondary | ICD-10-CM | POA: Diagnosis present

## 2015-08-25 DIAGNOSIS — J40 Bronchitis, not specified as acute or chronic: Secondary | ICD-10-CM

## 2015-08-25 DIAGNOSIS — A310 Pulmonary mycobacterial infection: Secondary | ICD-10-CM | POA: Diagnosis present

## 2015-08-25 DIAGNOSIS — R6521 Severe sepsis with septic shock: Secondary | ICD-10-CM | POA: Diagnosis present

## 2015-08-25 DIAGNOSIS — E871 Hypo-osmolality and hyponatremia: Secondary | ICD-10-CM | POA: Diagnosis present

## 2015-08-25 DIAGNOSIS — A419 Sepsis, unspecified organism: Secondary | ICD-10-CM | POA: Diagnosis present

## 2015-08-25 DIAGNOSIS — G35 Multiple sclerosis: Secondary | ICD-10-CM | POA: Diagnosis present

## 2015-08-25 LAB — COMPREHENSIVE METABOLIC PANEL
ALK PHOS: 62 U/L (ref 38–126)
ALT: 18 U/L (ref 14–54)
ANION GAP: 6 (ref 5–15)
AST: 22 U/L (ref 15–41)
Albumin: 3.2 g/dL — ABNORMAL LOW (ref 3.5–5.0)
BILIRUBIN TOTAL: 0.7 mg/dL (ref 0.3–1.2)
BUN: 15 mg/dL (ref 6–20)
CALCIUM: 8.2 mg/dL — AB (ref 8.9–10.3)
CO2: 22 mmol/L (ref 22–32)
CREATININE: 0.72 mg/dL (ref 0.44–1.00)
Chloride: 104 mmol/L (ref 101–111)
GFR calc non Af Amer: >60 mL/min (ref >60)
GLUCOSE: 112 mg/dL — AB (ref 65–99)
Potassium: 3.3 mmol/L — ABNORMAL LOW (ref 3.5–5.1)
SODIUM: 132 mmol/L — AB (ref 135–145)
TOTAL PROTEIN: 6.6 g/dL (ref 6.5–8.1)

## 2015-08-25 LAB — CBC WITH DIFFERENTIAL/PLATELET
BASOS ABS: 0 10*3/uL (ref 0.0–0.1)
BASOS PCT: 0 %
EOS ABS: 0 10*3/uL (ref 0.0–0.7)
Eosinophils Relative: 0 %
HCT: 38.9 % (ref 36.0–46.0)
Hemoglobin: 13.6 g/dL (ref 12.0–15.0)
Lymphocytes Relative: 5 %
Lymphs Abs: 1.3 10*3/uL (ref 0.7–4.0)
MCH: 32 pg (ref 26.0–34.0)
MCHC: 35 g/dL (ref 30.0–36.0)
MCV: 91.5 fL (ref 78.0–100.0)
MONO ABS: 1.1 10*3/uL — AB (ref 0.1–1.0)
MONOS PCT: 4 %
NEUTROS ABS: 23.6 10*3/uL — AB (ref 1.7–7.7)
NEUTROS PCT: 91 %
PLATELETS: 183 10*3/uL (ref 150–400)
RBC: 4.25 MIL/uL (ref 3.87–5.11)
RDW: 12.5 % (ref 11.5–15.5)
WBC Morphology: INCREASED
WBC: 26 10*3/uL — ABNORMAL HIGH (ref 4.0–10.5)

## 2015-08-25 LAB — MONONUCLEOSIS SCREEN: MONO SCREEN: POSITIVE — AB

## 2015-08-25 LAB — LACTIC ACID, PLASMA
LACTIC ACID, VENOUS: 0.8 mmol/L (ref 0.5–2.0)
LACTIC ACID, VENOUS: 0.7 mmol/L (ref 0.5–2.0)

## 2015-08-25 LAB — I-STAT BETA HCG BLOOD, ED (MC, WL, AP ONLY)

## 2015-08-25 LAB — MRSA PCR SCREENING: MRSA by PCR: NEGATIVE

## 2015-08-25 MED ORDER — DIATRIZOATE MEGLUMINE & SODIUM 66-10 % PO SOLN
ORAL | Status: AC
Start: 2015-08-25 — End: 2015-08-25
  Filled 2015-08-25: qty 30

## 2015-08-25 MED ORDER — VANCOMYCIN HCL IN DEXTROSE 1-5 GM/200ML-% IV SOLN
1000.0000 mg | Freq: Once | INTRAVENOUS | Status: DC
  Filled 2015-08-25: qty 200

## 2015-08-25 MED ORDER — SODIUM CHLORIDE 0.9 % IV SOLN
1000.0000 mL | INTRAVENOUS | Status: DC
  Administered 2015-08-25: 1000 mL via INTRAVENOUS

## 2015-08-25 MED ORDER — POTASSIUM CHLORIDE CRYS ER 20 MEQ PO TBCR
40.0000 meq | EXTENDED_RELEASE_TABLET | ORAL | Status: AC
  Administered 2015-08-25 (×2): 40 meq via ORAL
  Filled 2015-08-25 (×2): qty 2

## 2015-08-25 MED ORDER — OXYCODONE-ACETAMINOPHEN 5-325 MG PO TABS
1.0000 | ORAL_TABLET | ORAL | Status: DC | PRN
  Administered 2015-08-25 (×4): 1 via ORAL
  Filled 2015-08-25 (×4): qty 1

## 2015-08-25 MED ORDER — KETOROLAC TROMETHAMINE 30 MG/ML IJ SOLN
30.0000 mg | Freq: Once | INTRAMUSCULAR | Status: AC
  Administered 2015-08-25: 30 mg via INTRAVENOUS
  Filled 2015-08-25: qty 1

## 2015-08-25 MED ORDER — ALBUTEROL SULFATE HFA 108 (90 BASE) MCG/ACT IN AERS: 1.0000 | INHALATION_SPRAY | Freq: Four times a day (QID) | RESPIRATORY_TRACT | Status: AC | PRN

## 2015-08-25 MED ORDER — IOHEXOL 300 MG/ML  SOLN
100.0000 mL | Freq: Once | INTRAMUSCULAR | Status: AC | PRN
  Administered 2015-08-25: 100 mL via INTRAVENOUS

## 2015-08-25 MED ORDER — SODIUM CHLORIDE 0.9 % IV SOLN
1000.0000 mL | INTRAVENOUS | Status: DC
  Administered 2015-08-25 – 2015-08-28 (×4): 1000 mL via INTRAVENOUS

## 2015-08-25 MED ORDER — PNEUMOCOCCAL VAC POLYVALENT 25 MCG/0.5ML IJ INJ
0.5000 mL | INJECTION | INTRAMUSCULAR | Status: AC
  Administered 2015-08-26: 0.5 mL via INTRAMUSCULAR
  Filled 2015-08-25: qty 0.5

## 2015-08-25 MED ORDER — NICOTINE 21 MG/24HR TD PT24
21.0000 mg | MEDICATED_PATCH | Freq: Every day | TRANSDERMAL | Status: AC
  Administered 2015-08-25 – 2015-08-27 (×3): 21 mg via TRANSDERMAL
  Filled 2015-08-25 (×3): qty 1

## 2015-08-25 MED ORDER — ENOXAPARIN SODIUM 40 MG/0.4ML ~~LOC~~ SOLN
40.0000 mg | SUBCUTANEOUS | Status: DC
  Administered 2015-08-25 – 2015-08-28 (×4): 40 mg via SUBCUTANEOUS
  Filled 2015-08-25 (×4): qty 0.4

## 2015-08-25 MED ORDER — ASPIRIN EC 81 MG PO TBEC
81.0000 mg | DELAYED_RELEASE_TABLET | Freq: Every day | ORAL | Status: DC
  Administered 2015-08-25 – 2015-08-28 (×4): 81 mg via ORAL
  Filled 2015-08-25 (×4): qty 1

## 2015-08-25 MED ORDER — LEVOFLOXACIN IN D5W 750 MG/150ML IV SOLN
750.0000 mg | INTRAVENOUS | Status: DC
  Administered 2015-08-25 – 2015-08-26 (×2): 750 mg via INTRAVENOUS
  Filled 2015-08-25 (×2): qty 150

## 2015-08-25 MED ORDER — IBUPROFEN 600 MG PO TABS: 600.0000 mg | ORAL_TABLET | Freq: Four times a day (QID) | ORAL | Status: DC | PRN

## 2015-08-25 MED ORDER — SODIUM CHLORIDE 0.9 % IV SOLN
Freq: Once | INTRAVENOUS | Status: AC
  Administered 2015-08-25: 06:00:00 via INTRAVENOUS

## 2015-08-25 MED ORDER — SODIUM CHLORIDE 0.9 % IV SOLN
INTRAVENOUS | Status: DC
  Administered 2015-08-25 – 2015-08-27 (×6): via INTRAVENOUS

## 2015-08-25 MED ORDER — DEXTROSE 5 % IV SOLN
2.0000 g | Freq: Once | INTRAVENOUS | Status: AC
  Administered 2015-08-25: 2 g via INTRAVENOUS
  Filled 2015-08-25: qty 2

## 2015-08-25 MED ORDER — SODIUM CHLORIDE 0.9 % IV SOLN
1000.0000 mL | Freq: Once | INTRAVENOUS | Status: AC
  Administered 2015-08-25: 1000 mL via INTRAVENOUS

## 2015-08-25 MED ORDER — HYDROCODONE-ACETAMINOPHEN 5-325 MG PO TABS: 1.0000 | ORAL_TABLET | ORAL | Status: DC | PRN

## 2015-08-25 MED ORDER — PAROXETINE HCL 20 MG PO TABS
20.0000 mg | ORAL_TABLET | Freq: Every day | ORAL | Status: DC
  Administered 2015-08-25 – 2015-08-28 (×4): 20 mg via ORAL
  Filled 2015-08-25 (×4): qty 1

## 2015-08-25 MED ORDER — ALPRAZOLAM 0.5 MG PO TABS
0.5000 mg | ORAL_TABLET | Freq: Two times a day (BID) | ORAL | Status: DC | PRN
  Administered 2015-08-25 – 2015-08-27 (×5): 0.5 mg via ORAL
  Filled 2015-08-25 (×5): qty 1

## 2015-08-25 MED ORDER — PANTOPRAZOLE SODIUM 40 MG PO TBEC
40.0000 mg | DELAYED_RELEASE_TABLET | Freq: Every day | ORAL | Status: DC
  Administered 2015-08-25 – 2015-08-28 (×4): 40 mg via ORAL
  Filled 2015-08-25 (×4): qty 1

## 2015-08-25 NOTE — ED Notes (Signed)
Pt in CT.

## 2015-08-25 NOTE — Progress Notes (Signed)
Patient seen and examined. Admitted this a.m. at 6:30 AM for hypotension, myalgias, fever, cough. Found on abdominal CT to have pneumonia.  Severe sepsis with septic shock -Appears to be fluid responsive. So far is on her third liter of IV fluids with systolic blood pressures increasing from the 70s to the 90s. -Plan to continue aggressive IV fluid repletion for now, consider pressors if needed.  Community-acquired pneumonia -Feel there is no current need to treat with broad-spectrum antibiotics, will discontinue vancomycin and Azactam and start her on Levaquin. She does have a listed allergy to Tequin however she describes her allergy as "seeing spots". Will see if she can tolerate in the hospital.  Anxiety disorder -Continue Paxil, Xanax.  Peggye Pitt, MD [facility] Pager: [phone]

## 2015-08-25 NOTE — Discharge Instructions (Signed)
Your test suggest you have "Mono". Please increase fluids, and wash hands frequently. It is important that you rest and hydrate. Please call Dr Sherwood Gambler today for follow up office appointment on Monday. Have them recheck your labs. Use 2 puffs of  albuterol every 4 hours. PLEASE stop smoking. Use motrin every 6 hours for fever. Use norco for aching if needed. Infectious Mononucleosis Infectious mononucleosis is an infection caused by a virus. This illness is often called "mono." You can get mono from close contact with someone who is infected. If you have mono, you may feel tired and have a sore throat, a headache, or a fever. HOME CARE  Rest as needed.  Do not play contact sports, perform hard exercise, or lift anything that is heavy until your doctor says you can. You may need to wait a few months before playing sports. Your liver or spleen might be swollen and could get hurt.  Drink enough fluid to keep your pee (urine) clear or pale yellow.  Do not drink alcohol.  Take medicines only as told by your doctor. Do not give aspirin to children.  Eat soft foods. Cold foods such as ice cream or frozen ice pops can make your throat feel better.  If you have a sore throat, rinse your mouth (gargle) with a mixture of salt and water. Mix 1 teaspoon of salt with 1 cup of warm water.  Sucking on hard candy can help a sore throat.  Avoid kissing or sharing your drinking glass until your doctor says you are better. GET HELP IF:  Your fever does not go away after 10 days.  Your swollen glands are not back to normal after 4 weeks.  Your activity level is not back to normal after 2 months.  You have a yellow color to your eyes and skin (jaundice).  You have trouble pooping (constipation). GET HELP RIGHT AWAY IF:   You have strong pain in your belly or shoulder.  You are drooling.  You have trouble swallowing.  You have trouble breathing.  You have a stiff neck.  You have a bad  headache.  You keep throwing up (vomiting).  You have twitching or shaking (convulsions).  You are confused.  You have trouble with balance.  You have signs of body fluid loss (dehydration):  Weakness.  Sunken eyes.  Pale skin.  Dry mouth.  Fast breathing or heartbeat.   This information is not intended to replace advice given to you by your health care provider. Make sure you discuss any questions you have with your health care provider.   Document Released: 08/14/2009 Document Revised: 09/16/2014 Document Reviewed: 05/03/2014 Elsevier Interactive Patient Education 2016 [employer]  Chronic Bronchitis Chronic bronchitis is a lasting inflammation of the bronchial tubes, which are the tubes that carry air into your lungs. This is inflammation that occurs:   On most days of the week.   For at least three months at a time.   Over a period of two years in a row. When the bronchial tubes are inflamed, they start to produce mucus. The inflammation and buildup of mucus make it more difficult to breathe. Chronic bronchitis is usually a permanent problem and is one type of chronic obstructive pulmonary disease (COPD). People with chronic bronchitis are at greater risk for getting repeated colds, or respiratory infections. CAUSES  Chronic bronchitis most often occurs in people who have:  Long-standing, severe asthma.  A history of smoking.  Asthma and who also smoke. SIGNS  AND SYMPTOMS  Chronic bronchitis may cause the following:   A cough that brings up mucus (productive cough).  Shortness of breath.  Early morning headache.  Wheezing.  Chest discomfort.   Recurring respiratory infections. DIAGNOSIS  Your health care provider may confirm the diagnosis by:  Taking your medical history.  Performing a physical exam.  Taking a chest X-ray.   Performing pulmonary function tests. TREATMENT  Treatment involves controlling symptoms with medicines, oxygen  therapy, or making lifestyle changes, such as exercising and eating a healthy, well-balanced diet. Medicines could include:  Inhalers to improve air flow in and out of your lungs.  Antibiotics to treat bacterial infections, such as pneumonia, sinus infections, and acute bronchitis. As a preventative measure, your health care provider may recommend routine vaccinations for influenza and pneumonia. This is to prevent infection and hospitalization since you may be more at risk for these types of infections.  HOME CARE INSTRUCTIONS  Take medicines only as directed by your health care provider.   If you smoke cigarettes, chew tobacco, or use electronic cigarettes, quit. If you need help quitting, ask your health care provider.  Avoid pollen, dust, animal dander, molds, smoke, and other things that cause shortness of breath or wheezing attacks.  Talk to your health care provider about possible exercise routines. Regular exercise is very important to help you feel better.  If you are prescribed oxygen use at home follow these guidelines:  Never smoke while using oxygen. Oxygen does not burn or explode, but flammable materials will burn faster in the presence of oxygen.  Keep a Government social research officer close by. Let your fire department know that you have oxygen in your home.  Warn visitors not to smoke near you when you are using oxygen. Put up "no smoking" signs in your home where you most often use the oxygen.  Regularly test your smoke detectors at home to make sure they work. If you receive care in your home from a nurse or other health care provider, he or she may also check to make sure your smoke detectors work.  Ask your health care provider whether you would benefit from a pulmonary rehabilitation program.  Do not wait to get medical care if you have any concerning symptoms. Delays could cause permanent injury and may be life threatening. SEEK MEDICAL CARE IF:  You have increased coughing  or shortness of breath or both.  You have muscle aches.  You have chest pain.  Your mucus gets thicker.  Your mucus changes from clear or white to yellow, green, gray, or bloody. SEEK IMMEDIATE MEDICAL CARE IF:  Your usual medicines do not stop your wheezing.   You have increased difficulty breathing.   You have any problems with the medicine you are taking, such as a rash, itching, swelling, or trouble breathing. MAKE SURE YOU:   Understand these instructions.  Will watch your condition.  Will get help right away if you are not doing well or get worse.   This information is not intended to replace advice given to you by your health care provider. Make sure you discuss any questions you have with your health care provider.   Document Released: 06/13/2006 Document Revised: 09/16/2014 Document Reviewed: 10/04/2013 Elsevier Interactive Patient Education [employer]

## 2015-08-25 NOTE — ED Notes (Signed)
Ambulated pt around nurses station.  Pt denies any dizziness or lightheadedness.

## 2015-08-25 NOTE — ED Notes (Signed)
Notified by NT that pt's BP was low when doing discharge vitals. Rechecked BP in other arm and got reading of 90/55. Notified Loney Laurence, PA-C of this. New orders given and carried out accordingly. Will continue to monitor.

## 2015-08-25 NOTE — ED Notes (Signed)
Lab at bedside

## 2015-08-25 NOTE — H&P (Signed)
PCP:   Cassell Smiles., MD   Chief Complaint:  Generalized body aches  HPI: 49 year old female who   has a past medical history of Anxiety; Migraines; COPD (chronic obstructive pulmonary disease) (HCC); Chest pain (2003); Mycobacterium avium complex (HCC) (2010); Gastroesophageal reflux disease (2009); Colitis (2005); Hyperlipidemia (2009); Mitral valve prolapse; MS (multiple sclerosis) (HCC); Mitral valve prolapse; Lung disease (2010); MS (multiple sclerosis) (HCC) (2001); and PONV (postoperative nausea and vomiting). Today came to the ED with chief complaint of generalized body aches, nasal congestion and cough. Symptoms started on December 13 at that time she had fever with temp of 100.3. Patient was seen in the ED yesterday and was discharged home after she responded to IV for boluses. Although workup was negative for infectious process. Patient did have white count of 33,000 which was thought to be due to hemoconcentration. Today patient came back to the hospital with not much improvement. Continues to have generalized body aches coughing up white colored phlegm. Denies shortness of breath. White count today is 26,000, patient was hypotensive in the ED requiring IV for boluses. Lactic acid is normal 0.8 Chest x-ray shows chronic bronchitis changes but no acute process. UA is clear. 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  Patient also has been having vague abdominal pain and has been seen by gastroenterology as outpatient. Today also she complains of generalized abdominal pain. Mono screen was positive in the ED.  Allergies:   Allergies  Allergen Reactions  . Penicillins Anaphylaxis and Swelling    Has patient had a PCN reaction causing immediate rash, facial/tongue/throat swelling, SOB or lightheadedness with hypotension: No Has patient had a PCN reaction causing severe rash  involving mucus membranes or skin necrosis: No Has patient had a PCN reaction that required hospitalization No Has patient had a PCN reaction occurring within the last 10 years: No If all of the above answers are "NO", then may proceed with Cephalosporin use.   Marland Kitchen Morphine     REACTION: hallucinations  . Rifampin     REACTION: skin yellowed,blisters around lips and tongue, urine was orange in color,nausea  . Tequin [Gatifloxacin]       Past Medical History  Diagnosis Date  . Anxiety   . Migraines   . COPD (chronic obstructive pulmonary disease) (HCC)     11/2003: Normal PFTs ex minimal increased volumes and decreased the DLCO; Followed in Pulmonary clinic/ [facility]/ [place]   . Chest pain 2003    11/2007: Admission with unstable angina; normal coronary angiography  with [facility]; nl EF  . Mycobacterium avium complex (HCC) 2010    01/2009: + AFB stain on bronchial washings  . Gastroesophageal reflux disease 2009    Hiatal hernia; esophageal dilatation for Schatzki's ring in 04/2008; chronic constipation; irritable bowel syndrome  . Colitis 2005  . Hyperlipidemia 2009    Lipid profile 07/2012:227, 91, 56, 153  . Mitral valve prolapse   . MS (multiple sclerosis) (HCC)   . Mitral valve prolapse   . Lung disease 2010  . MS (multiple sclerosis) (HCC) 2001  . PONV (postoperative nausea and vomiting)     Past Surgical History  Procedure Laterality Date  . Vocal cord polyp removed  2003  . Orif radial fracture  2012    Left  . Fiberoptic bronchoscopy  2010  . Colonoscopy w/ polypectomy  2006    Dr. Jena Gauss: 3 diminutive polyps  in descending colon, unknown path   . Esophagogastroduodenoscopy  2009    Dr. Jena Gauss: normal esophagus, small hiatal hernia, s/p empiric dilatilon   . Esophagogastroduodenoscopy  2003    Schatzki's ring s/p dilation    Prior to Admission medications   Medication Sig Start Date End Date Taking? Authorizing Provider  albuterol  (PROVENTIL HFA;VENTOLIN HFA) 108 (90 BASE) MCG/ACT inhaler Inhale 1-2 puffs into the lungs every 6 (six) hours as needed for wheezing or shortness of breath. 08/25/15   Ivery Quale, PA-C  ALPRAZolam Prudy Feeler) 1 MG tablet Take 1 mg by mouth as directed. Take 1-2 as needed anxiety     Historical Provider, MD  aspirin 81 MG tablet Take 81 mg by mouth daily.     Historical Provider, MD  dexlansoprazole (DEXILANT) 60 MG capsule Take 1 capsule (60 mg total) by mouth daily. 08/09/15   Nira Retort, NP  dextromethorphan-guaiFENesin [facility] DM) 30-600 MG 12hr tablet Take 1 tablet by mouth 2 (two) times daily. 08/24/15   Vanetta Mulders, MD  HYDROcodone-acetaminophen (NORCO/VICODIN) 5-325 MG tablet Take 1 tablet by mouth every 4 (four) hours as needed. 08/25/15   Ivery Quale, PA-C  ibuprofen (ADVIL,MOTRIN) 600 MG tablet Take 1 tablet (600 mg total) by mouth every 6 (six) hours as needed for fever or moderate pain. 08/25/15   Ivery Quale, PA-C  oxyCODONE-acetaminophen (PERCOCET/ROXICET) 5-325 MG per tablet Take 1 tablet by mouth every 4 (four) hours as needed for moderate pain.     Historical Provider, MD  pantoprazole (PROTONIX) 40 MG tablet Take 40 mg by mouth daily.    Historical Provider, MD  PARoxetine (PAXIL) 20 MG tablet Take 20 mg by mouth daily.    Historical Provider, MD  polyethylene glycol-electrolytes (NULYTELY/GOLYTELY) 420 G solution Take 4,000 mLs by mouth once. 08/22/15   Nira Retort, NP  promethazine (PHENERGAN) 25 MG tablet Take 25 mg by mouth every 6 (six) hours as needed for nausea or vomiting.     Historical Provider, MD  promethazine (PHENERGAN) 25 MG tablet Take 1 tablet (25 mg total) by mouth every 6 (six) hours as needed. 08/24/15   Vanetta Mulders, MD    Social History:  reports that she has been smoking Cigarettes.  She has a 15 pack-year smoking history. She does not have any smokeless tobacco history on file. She reports that she does not drink alcohol or use illicit  drugs.  Family History  Problem Relation Age of Onset  . Emphysema Maternal Grandfather     was a smoker  . Asthma Sister   . Heart disease Sister     Also brother; cardiomyopathy  . Heart attack Father 48  . Colon cancer Neg Hx     Filed Weights   08/24/15 2308  Weight: 57.153 kg (126 lb)    All the positives are listed in BOLD  Review of Systems:  HEENT: Headache, blurred vision, runny nose, sore throat Neck: Hypothyroidism, hyperthyroidism,,lymphadenopathy Chest : Shortness of breath, history of COPD, Asthma, cough Heart : Chest pain, history of coronary arterey disease GI:  Nausea, vomiting, diarrhea, constipation, GERD GU: Dysuria, urgency, frequency of urination, hematuria Neuro: Stroke, seizures, syncope Psych: Depression, anxiety, hallucinations   Physical Exam: Blood pressure 94/63, pulse 52, temperature 97.7 F (36.5 C), temperature source Oral, resp. rate 18, height  (1.778 m), weight 57.153 kg (126 lb), SpO2 99 %. Constitutional:   Patient is a well-developed and well-nourished  in no acute distress and cooperative with exam. Head: Normocephalic  and atraumatic Mouth: Mucus membranes moist Eyes: PERRL, EOMI, conjunctivae normal Neck: Supple, No Thyromegaly Cardiovascular: RRR, S1 normal, S2 normal Pulmonary/Chest: CTAB, no wheezes, rales, or rhonchi Abdominal: Soft. Generalized tenderness, positive guarding, non-distended, bowel sounds are normal, no masses, organomegaly  Neurological: A&O x3, Strength is normal and symmetric bilaterally, cranial nerve II-XII are grossly intact, no focal motor deficit, sensory intact to light touch bilaterally.  Extremities : No Cyanosis, Clubbing or Edema  Labs on Admission:  Basic Metabolic Panel:  Recent Labs Lab 08/24/15 1132 08/25/15 0010  NA 133* 132*  K 3.3* 3.3*  CL 100* 104  CO2 22 22  GLUCOSE 114* 112*  BUN 20 15  CREATININE 0.72 0.72  CALCIUM 9.3 8.2*   Liver Function Tests:  Recent Labs Lab  08/24/15 1132 08/25/15 0010  AST 18 22  ALT 18 18  ALKPHOS 81 62  BILITOT 0.6 0.7  PROT 7.8 6.6  ALBUMIN 3.8 3.2*   No results for input(s): LIPASE, AMYLASE in the last 168 hours. No results for input(s): AMMONIA in the last 168 hours. CBC:  Recent Labs Lab 08/24/15 1132 08/25/15 0010  WBC 33.9* 26.0*  NEUTROABS 31.5* 23.6*  HGB 16.3* 13.6  HCT 44.8 38.9  MCV 90.9 91.5  PLT 241 183    Radiological Exams on Admission: Dg Chest 2 View  08/25/2015  CLINICAL DATA:  Fever, headaches, chills, muscle soreness, productive cough for 2 days. History of COPD and MS. Right chest pain. EXAM: CHEST  2 VIEW COMPARISON:  08/24/2015 FINDINGS: Mild hyperinflation. Peribronchial thickening and central interstitial changes likely due to chronic bronchitis. Normal heart size and pulmonary vascularity. No focal airspace disease or consolidation in the lungs. No blunting of costophrenic angles. No pneumothorax. Mediastinal contours appear intact. IMPRESSION: Emphysematous changes and chronic bronchitic changes in the chest. No evidence of active pulmonary disease. Electronically Signed   By: Burman Nieves M.D.   On: 08/25/2015 00:33   Dg Chest 2 View  08/24/2015  CLINICAL DATA:  Cough, congestion, fever, body aches and chills since 08/22/2015, smoker, COPD, multiple sclerosis EXAM: CHEST  2 VIEW COMPARISON:  06/14/2015 FINDINGS: Normal heart size, mediastinal contours, and pulmonary vascularity. Lungs emphysematous with minimal central peribronchial thickening compatible with COPD. No infiltrate, pleural effusion or pneumothorax. Osseous structures unremarkable. IMPRESSION: COPD changes. No acute abnormalities. Electronically Signed   By: Ulyses Southward M.D.   On: 08/24/2015 12:32      Assessment/Plan Active Problems:   Gastroesophageal reflux disease   Bronchitis   SIRS (systemic inflammatory response syndrome) (HCC)  SIRS Patient presenting with scissors criteria with fever and  leukocytosis. No clear source of infection identified at this time. We will follow the culture results. Patient has multiple allergies to antibiotics and had low-grade fever in the ED. Patient responded to the IV fluids and blood pressure has improved Will start empirically on vancomycin and Azactam.  Abdominal pain Patient complains of ongoing abdominal pain for past few days, she has been seen by GI as outpatient Will obtain CT abdomen and pelvis  Acute bronchitis versus bronchiectasis Patient has a history of bronchiectasis as seen on the CT chest in 2010. She has positive colonization of Mycobacterium avium intracellulare. Will repeat CT chest to rule out underlying pneumonia.  ? Infectious mononucleosis Patient presented with  vague generalized symptoms and Mono screen positive in the ED. Will obtain CT abdomen and pelvis as above to rule out splenomegaly  DVT prophylaxis Lovenox  Code status: Full code  Family discussion: No  family present at bedside   Time Spent on Admission: 60 min  [facility] Pager: [phone] 08/25/2015, 6:40 AM  If 7PM-7AM, please contact night-coverage  [email or web address]  Password [email or web address]

## 2015-08-25 NOTE — Progress Notes (Signed)
eLink Physician-Brief Progress Note Patient Name: Sharon Gay DOB: 02/13/1966 MRN: [id number]   Date of Service  08/25/2015  HPI/Events of Note  Patient with 30 pack year h/o of smoking requesting nicoderm patch  eICU Interventions  Plan: 21 mg patch ordered q24 hours times 3 days     Intervention Category Minor Interventions: Routine modifications to care plan (e.g. PRN medications for pain, fever)  Roscoe Witts 08/25/2015, 9:38 PM

## 2015-08-26 LAB — COMPREHENSIVE METABOLIC PANEL
ALBUMIN: 2.5 g/dL — AB (ref 3.5–5.0)
ALK PHOS: 59 U/L (ref 38–126)
ALT: 34 U/L (ref 14–54)
ANION GAP: 5 (ref 5–15)
AST: 51 U/L — ABNORMAL HIGH (ref 15–41)
BILIRUBIN TOTAL: 0.2 mg/dL — AB (ref 0.3–1.2)
BUN: 10 mg/dL (ref 6–20)
CALCIUM: 7.6 mg/dL — AB (ref 8.9–10.3)
CO2: 20 mmol/L — ABNORMAL LOW (ref 22–32)
Chloride: 115 mmol/L — ABNORMAL HIGH (ref 101–111)
Creatinine, Ser: 0.54 mg/dL (ref 0.44–1.00)
GFR calc Af Amer: >60 mL/min (ref >60)
GFR calc non Af Amer: >60 mL/min (ref >60)
GLUCOSE: 119 mg/dL — AB (ref 65–99)
Potassium: 3.5 mmol/L (ref 3.5–5.1)
Sodium: 140 mmol/L (ref 135–145)
TOTAL PROTEIN: 5.6 g/dL — AB (ref 6.5–8.1)

## 2015-08-26 LAB — CBC
HEMATOCRIT: 33.5 % — AB (ref 36.0–46.0)
HEMOGLOBIN: 11.5 g/dL — AB (ref 12.0–15.0)
MCH: 31.9 pg (ref 26.0–34.0)
MCHC: 34.3 g/dL (ref 30.0–36.0)
MCV: 92.8 fL (ref 78.0–100.0)
Platelets: 190 10*3/uL (ref 150–400)
RBC: 3.61 MIL/uL — ABNORMAL LOW (ref 3.87–5.11)
RDW: 13.5 % (ref 11.5–15.5)
WBC: 11 10*3/uL — ABNORMAL HIGH (ref 4.0–10.5)

## 2015-08-26 LAB — CULTURE, GROUP A STREP: Strep A Culture: NEGATIVE

## 2015-08-26 MED ORDER — OXYCODONE-ACETAMINOPHEN 5-325 MG PO TABS
1.0000 | ORAL_TABLET | Freq: Four times a day (QID) | ORAL | Status: DC | PRN
  Administered 2015-08-26 (×4): 2 via ORAL
  Administered 2015-08-26: 1 via ORAL
  Administered 2015-08-27 – 2015-08-28 (×4): 2 via ORAL
  Filled 2015-08-26 (×8): qty 2

## 2015-08-26 MED ORDER — KETOROLAC TROMETHAMINE 15 MG/ML IJ SOLN
15.0000 mg | Freq: Three times a day (TID) | INTRAMUSCULAR | Status: DC | PRN
  Administered 2015-08-26 – 2015-08-27 (×3): 15 mg via INTRAVENOUS
  Filled 2015-08-26 (×3): qty 1

## 2015-08-26 MED ORDER — LEVOFLOXACIN 750 MG PO TABS
750.0000 mg | ORAL_TABLET | Freq: Every day | ORAL | Status: DC
  Administered 2015-08-27 – 2015-08-28 (×2): 750 mg via ORAL
  Filled 2015-08-26 (×2): qty 1

## 2015-08-26 NOTE — Progress Notes (Signed)
[facility] PROGRESS NOTE  Sharon Gay ZOX:[id number] DOB: [birth year] DOA: 08/24/2015 PCP: Cassell Smiles., MD  Assessment/Plan: Severe sepsis with septic shock -Fluid responsive, blood pressures find upper 90s to low 100s. -No need for pressors.  Community-acquired pneumonia -Is afebrile, leukocytosis markedly improved from 34-11. -Continue Levaquin will plan for a 7 day course. She is tolerating this despite her allergy to Tequin.  Chronic pain/anxiety disorder -Continue Paxil, Xanax -Has severe drug-seeking tendencies. She states that she is prescribed 20 mg of oxycodone to take 2 tablets every 4 hours. I find this unlikely and when her medication reconciliation was done she was supposed to be on 5 mg 1-2 tablets.  -I have advised her that I cannot increase her narcotics any further but I am willing to give her some Toradol every 8 hours as needed for pain relief.  Code Status: Full code Family Communication: Patient only  Disposition Plan: We'll transfer to floor today, hopefully for discharge home in 1-2 days   Consultants:  None   Antibiotics:  Rocephin  Azathioprine   Subjective: No chest pain or shortness of breath. Complains of "pain all over" that is not adequately being treated.  Objective: Filed Vitals:   08/26/15 0700 08/26/15 0800 08/26/15 0822 08/26/15 0900  BP: 92/68 111/98  96/64  Pulse: 55 63  58  Temp:   97 F (36.1 C)   TempSrc:   Oral   Resp: Height:      Weight:      SpO2: 97% 100%  97%    Intake/Output Summary (Last 24 hours) at 08/26/15 1010 Last data filed at 08/26/15 0800  Gross per 24 hour  Intake   4050 ml  Output    152 ml  Net   3898 ml   Filed Weights   08/24/15 2308 08/25/15 0908 08/26/15 0506  Weight: 57.153 kg (126 lb) 62 kg (136 lb 11 oz) 61.5 kg (135 lb 9.3 oz)    Exam:   General:  Alert, awake, oriented 3   Cardiovascular: regular rate and rhythm, no murmurs, rubs or  gallops  Respiratory: Clear to auscultation bilaterally  Abdomen: Soft, nontender, nondistended, positive bowel sounds  Extremities: No clubbing cyanosis or edema, positive pulses   Neurologic:  Grossly intact and nonfocal  Data Reviewed: Basic Metabolic Panel:  Recent Labs Lab 08/24/15 1132 08/25/15 0010 08/26/15 0445  NA 133* 132* 140  K 3.3* 3.3* 3.5  CL 100* 104 115*  CO2 22 22 20*  GLUCOSE 114* 112* 119*  BUN CREATININE 0.72 0.72 0.54  CALCIUM 9.3 8.2* 7.6*   Liver Function Tests:  Recent Labs Lab 08/24/15 1132 08/25/15 0010 08/26/15 0445  AST 18 22 51*  ALT 18 18 34  ALKPHOS 81 62 59  BILITOT 0.6 0.7 0.2*  PROT 7.8 6.6 5.6*  ALBUMIN 3.8 3.2* 2.5*   No results for input(s): LIPASE, AMYLASE in the last 168 hours. No results for input(s): AMMONIA in the last 168 hours. CBC:  Recent Labs Lab 08/24/15 1132 08/25/15 0010 08/26/15 0445  WBC 33.9* 26.0* 11.0*  NEUTROABS 31.5* 23.6*  --   HGB 16.3* 13.6 11.5*  HCT 44.8 38.9 33.5*  MCV 90.9 91.5 92.8  PLT 241 183 190   Cardiac Enzymes: No results for input(s): CKTOTAL, CKMB, CKMBINDEX, TROPONINI in the last 168 hours. BNP (last 3 results) No results for input(s): BNP in the last 8760 hours.  ProBNP (last 3 results) No  results for input(s): PROBNP in the last 8760 hours.  CBG: No results for input(s): GLUCAP in the last 168 hours.  Recent Results (from the past 240 hour(s))  Rapid strep screen (not at [facility])     Status: None   Collection Time: 08/24/15 11:46 AM  Result Value Ref Range Status   Streptococcus, Group A Screen (Direct) NEGATIVE NEGATIVE Final    Comment: (NOTE) A Rapid Antigen test may result negative if the antigen level in the sample is below the detection level of this test. The FDA has not cleared this test as a stand-alone test therefore the rapid antigen negative result has reflexed to a Group A Strep culture.   Culture, blood (routine x 2)     Status: None  (Preliminary result)   Collection Time: 08/25/15 12:10 AM  Result Value Ref Range Status   Specimen Description BLOOD LEFT ANTECUBITAL  Final   Special Requests BOTTLES DRAWN AEROBIC AND ANAEROBIC 8CC EACH  Final   Culture NO GROWTH 1 DAY  Final   Report Status PENDING  Incomplete  Culture, blood (routine x 2)     Status: None (Preliminary result)   Collection Time: 08/25/15 12:20 AM  Result Value Ref Range Status   Specimen Description BLOOD RIGHT ANTECUBITAL  Final   Special Requests BOTTLES DRAWN AEROBIC AND ANAEROBIC 8CC EACH  Final   Culture NO GROWTH 1 DAY  Final   Report Status PENDING  Incomplete  MRSA PCR Screening     Status: None   Collection Time: 08/25/15 10:43 AM  Result Value Ref Range Status   MRSA by PCR NEGATIVE NEGATIVE Final    Comment:        The GeneXpert MRSA Assay (FDA approved for NASAL specimens only), is one component of a comprehensive MRSA colonization surveillance program. It is not intended to diagnose MRSA infection nor to guide or monitor treatment for MRSA infections.      Studies: Dg Chest 2 View  08/25/2015  CLINICAL DATA:  Fever, headaches, chills, muscle soreness, productive cough for 2 days. History of COPD and MS. Right chest pain. EXAM: CHEST  2 VIEW COMPARISON:  08/24/2015 FINDINGS: Mild hyperinflation. Peribronchial thickening and central interstitial changes likely due to chronic bronchitis. Normal heart size and pulmonary vascularity. No focal airspace disease or consolidation in the lungs. No blunting of costophrenic angles. No pneumothorax. Mediastinal contours appear intact. IMPRESSION: Emphysematous changes and chronic bronchitic changes in the chest. No evidence of active pulmonary disease. Electronically Signed   By: Burman Nieves M.D.   On: 08/25/2015 00:33   Dg Chest 2 View  08/24/2015  CLINICAL DATA:  Cough, congestion, fever, body aches and chills since 08/22/2015, smoker, COPD, multiple sclerosis EXAM: CHEST  2 VIEW  COMPARISON:  06/14/2015 FINDINGS: Normal heart size, mediastinal contours, and pulmonary vascularity. Lungs emphysematous with minimal central peribronchial thickening compatible with COPD. No infiltrate, pleural effusion or pneumothorax. Osseous structures unremarkable. IMPRESSION: COPD changes. No acute abnormalities. Electronically Signed   By: Ulyses Southward M.D.   On: 08/24/2015 12:32   Ct Chest W Contrast  08/25/2015  CLINICAL DATA:  49 year old female with fever, leukocytosis, headache, back pain, chills, muscle 8, cough, sore throat. Symptoms for 3 days. Decline to drink oral contrast. Initial encounter. Multiple sclerosis. EXAM: CT CHEST, ABDOMEN, AND PELVIS WITH CONTRAST TECHNIQUE: Multidetector CT imaging of the chest, abdomen and pelvis was performed following the standard protocol during bolus administration of intravenous contrast. CONTRAST:  OMNIPAQUE IOHEXOL 300 MG/ML  SOLN  COMPARISON:  Chest radiographs 0010 hours today. Chest CT 04/25/2009 and earlier. Body CT 08/19/2004. FINDINGS: CT CHEST FINDINGS Confluent consolidation in the right lower lobe medial and posterior basal segments. Air bronchograms. Trace layering right pleural effusion. Peri-bronchovascular opacity in the inferior right upper lobe (series 8, image 27). Nodular peri-bronchovascular opacity also in the medial and posterior basal segments of the left lower lobe. No additional consolidation. No left pleural effusion. Central airways remain patent. Reactive appearing mediastinal lymphadenopathy; mildly enlarged and hyper enhancing lymph nodes about the carina and right peritracheal nodal stations. No pericardial effusion. Negative thoracic inlet. No axillary lymphadenopathy. No acute osseous abnormality identified. Exaggerated thoracic kyphosis. CT ABDOMEN PELVIS FINDINGS No acute osseous abnormality identified. Chronic L5-S1 disc and endplate degeneration. Small volume pelvic free fluid. Uterus is either diminutive or absent  (not visible on axial images but may be small and anteverted adjacent to the urinary bladder on sagittal image 47). Diminutive urinary bladder. Decompressed but redundant large bowel in the pelvis. Sigmoid retained stool. No distal large bowel wall thickening. Decompressed left colon. Redundant transverse colon. Fluid in the right colon and cecum without definite wall thickening. The cecum is mostly located in the right hemipelvis today. Ileocecal valve is best seen on coronal image 26. Decompressed terminal ileum. Appendix is not delineated today. No pericecal inflammation. No dilated small bowel. Multiple fluid containing decompressed pelvic small bowel loops. Trace amount of oral contrast in the stomach which is decompressed. Prominent gastric mucosal enhancement. Negative duodenum. Mild calcified aortic atherosclerosis. Major arterial structures in the abdomen and pelvis are patent. Spleen, pancreas, and adrenal glands are within normal limits. The liver has significantly enlarged compared to 2005, so much so that there is caudal displacement of the right kidney from the right renal fossa by enlarged right liver. Early liver enhancement is moderately heterogeneous. The IVC is enlarged but appears without filling defect on early and delayed postcontrast images. No perihepatic or abdominal free fluid. Portal venous system likewise appears patent. Gallbladder within normal limits. Other than being displaced inferiorly since 2005, the right kidney appears normal. Bilateral renal enhancement and contrast excretion is within normal limits. No lymphadenopathy. IMPRESSION: 1. Acute pneumonia, primarily right lower lobe but with early spread of infection to the right upper and left lower lobes. Trace right pleural effusion. 2. Reactive mediastinal lymphadenopathy. 3. Enlarged and heterogeneously enhancing liver, new since 2005. Top differential considerations in this setting include acute hepatitis, drug toxicity,  congestive hepatopathy (but there no associated cardiac changes). 4. Small volume nonspecific pelvic free fluid, nonspecific and might be physiologic. 5. No definite bowel inflammation. Electronically Signed   By: Odessa Fleming M.D.   On: 08/25/2015 08:43   Ct Abdomen Pelvis W Contrast  08/25/2015  CLINICAL DATA:  49 year old female with fever, leukocytosis, headache, back pain, chills, muscle 8, cough, sore throat. Symptoms for 3 days. Decline to drink oral contrast. Initial encounter. Multiple sclerosis. EXAM: CT CHEST, ABDOMEN, AND PELVIS WITH CONTRAST TECHNIQUE: Multidetector CT imaging of the chest, abdomen and pelvis was performed following the standard protocol during bolus administration of intravenous contrast. CONTRAST:  OMNIPAQUE IOHEXOL 300 MG/ML  SOLN COMPARISON:  Chest radiographs 0010 hours today. Chest CT 04/25/2009 and earlier. Body CT 08/19/2004. FINDINGS: CT CHEST FINDINGS Confluent consolidation in the right lower lobe medial and posterior basal segments. Air bronchograms. Trace layering right pleural effusion. Peri-bronchovascular opacity in the inferior right upper lobe (series 8, image 27). Nodular peri-bronchovascular opacity also in the medial and posterior basal segments of  the left lower lobe. No additional consolidation. No left pleural effusion. Central airways remain patent. Reactive appearing mediastinal lymphadenopathy; mildly enlarged and hyper enhancing lymph nodes about the carina and right peritracheal nodal stations. No pericardial effusion. Negative thoracic inlet. No axillary lymphadenopathy. No acute osseous abnormality identified. Exaggerated thoracic kyphosis. CT ABDOMEN PELVIS FINDINGS No acute osseous abnormality identified. Chronic L5-S1 disc and endplate degeneration. Small volume pelvic free fluid. Uterus is either diminutive or absent (not visible on axial images but may be small and anteverted adjacent to the urinary bladder on sagittal image 47). Diminutive  urinary bladder. Decompressed but redundant large bowel in the pelvis. Sigmoid retained stool. No distal large bowel wall thickening. Decompressed left colon. Redundant transverse colon. Fluid in the right colon and cecum without definite wall thickening. The cecum is mostly located in the right hemipelvis today. Ileocecal valve is best seen on coronal image 26. Decompressed terminal ileum. Appendix is not delineated today. No pericecal inflammation. No dilated small bowel. Multiple fluid containing decompressed pelvic small bowel loops. Trace amount of oral contrast in the stomach which is decompressed. Prominent gastric mucosal enhancement. Negative duodenum. Mild calcified aortic atherosclerosis. Major arterial structures in the abdomen and pelvis are patent. Spleen, pancreas, and adrenal glands are within normal limits. The liver has significantly enlarged compared to 2005, so much so that there is caudal displacement of the right kidney from the right renal fossa by enlarged right liver. Early liver enhancement is moderately heterogeneous. The IVC is enlarged but appears without filling defect on early and delayed postcontrast images. No perihepatic or abdominal free fluid. Portal venous system likewise appears patent. Gallbladder within normal limits. Other than being displaced inferiorly since 2005, the right kidney appears normal. Bilateral renal enhancement and contrast excretion is within normal limits. No lymphadenopathy. IMPRESSION: 1. Acute pneumonia, primarily right lower lobe but with early spread of infection to the right upper and left lower lobes. Trace right pleural effusion. 2. Reactive mediastinal lymphadenopathy. 3. Enlarged and heterogeneously enhancing liver, new since 2005. Top differential considerations in this setting include acute hepatitis, drug toxicity, congestive hepatopathy (but there no associated cardiac changes). 4. Small volume nonspecific pelvic free fluid, nonspecific and  might be physiologic. 5. No definite bowel inflammation. Electronically Signed   By: Odessa Fleming M.D.   On: 08/25/2015 08:43    Scheduled Meds: . aspirin EC  81 mg Oral Daily  . enoxaparin (LOVENOX) injection  40 mg Subcutaneous Q24H  . levofloxacin (LEVAQUIN) IV  750 mg Intravenous Q24H  . nicotine  21 mg Transdermal Daily  . pantoprazole  40 mg Oral Daily  . PARoxetine  20 mg Oral Daily  . pneumococcal 23 valent vaccine  0.5 mL Intramuscular Tomorrow-1000   Continuous Infusions: . sodium chloride 150 mL/hr at 08/26/15 0800  . sodium chloride 1,000 mL (08/25/15 0945)    Principal Problem:   Severe sepsis with septic shock (HCC) Active Problems:   CAP (community acquired pneumonia)   Anxiety   Gastroesophageal reflux disease   SIRS (systemic inflammatory response syndrome) (HCC)    Time spent: 25 minutes. Greater than 50% of this time was spent in direct contact with the patient coordinating care.    [name] Jan  [facility] Pager [phone]  If 7PM-7AM, please contact night-coverage at [email or web address], password [facility] 08/26/2015, 10:10 AM  LOS: 1 day

## 2015-08-27 MED ORDER — MORPHINE SULFATE (PF) 2 MG/ML IV SOLN
INTRAVENOUS | Status: AC
  Filled 2015-08-27: qty 1

## 2015-08-27 MED ORDER — MORPHINE SULFATE (PF) 2 MG/ML IV SOLN: 1.0000 mg | INTRAVENOUS | Status: DC | PRN

## 2015-08-27 MED ORDER — HYDROMORPHONE HCL 1 MG/ML IJ SOLN
0.5000 mg | Freq: Once | INTRAMUSCULAR | Status: AC
  Administered 2015-08-27: 0.5 mg via INTRAVENOUS
  Filled 2015-08-27: qty 1

## 2015-08-27 NOTE — Progress Notes (Signed)
[facility] PROGRESS NOTE  Sharon Gay:[id number] DOB: Aug 01, 1966 DOA: 08/24/2015 PCP: Cassell Smiles., MD  Assessment/Plan: Severe sepsis with septic shock -Fluid responsive, blood pressures find upper 90s to low 100s. -No need for pressors. -Resolved.  Community-acquired pneumonia -Is afebrile, leukocytosis markedly improved from 34-11. -Continue Levaquin will plan for a 7 day course. She is tolerating this despite her allergy to Tequin. -Was febrile this am. Watch another 24 hours.  Chronic pain/anxiety disorder -Continue Paxil, Xanax -Has severe drug-seeking tendencies. She states that she is prescribed 20 mg of oxycodone to take 2 tablets every 4 hours. I find this unlikely and when her medication reconciliation was done she was supposed to be on 5 mg 1-2 tablets.  -I have advised her that I cannot increase her narcotics any further but I am willing to give her some Toradol every 8 hours as needed for pain relief. -12/18: states Toradol does not help with her pain and is requesting IV Morphine. Will allow.  Code Status: Full code Family Communication: Patient only  Disposition Plan: DC home in 24-48 hours   Consultants:  None   Antibiotics:  Rocephin  Azithromycin  Subjective: No chest pain or shortness of breath. Complains of "pain all over" that is not adequately being treated. Febrile this am.  Objective: Filed Vitals:   08/26/15 2219 08/27/15 0641 08/27/15 0900 08/27/15 1442  BP: 113/68 119/71  122/71  Pulse: 65 66  57  Temp: 98.6 F (37 C) 101 F (38.3 C) 99.1 F (37.3 C) 98.5 F (36.9 C)  TempSrc: Oral Oral  Oral  Resp: Height:      Weight:      SpO2: 98% 95%  97%    Intake/Output Summary (Last 24 hours) at 08/27/15 1450 Last data filed at 08/27/15 1300  Gross per 24 hour  Intake   3125 ml  Output      0 ml  Net   3125 ml   Filed Weights   08/24/15 2308 08/25/15 0908 08/26/15 0506  Weight: 57.153 kg (126  lb) 62 kg (136 lb 11 oz) 61.5 kg (135 lb 9.3 oz)    Exam:   General:  Alert, awake, oriented 3   Cardiovascular: regular rate and rhythm, no murmurs, rubs or gallops  Respiratory: Clear to auscultation bilaterally  Abdomen: Soft, nontender, nondistended, positive bowel sounds  Extremities: No clubbing cyanosis or edema, positive pulses   Neurologic:  Grossly intact and nonfocal  Data Reviewed: Basic Metabolic Panel:  Recent Labs Lab 08/24/15 1132 08/25/15 0010 08/26/15 0445  NA 133* 132* 140  K 3.3* 3.3* 3.5  CL 100* 104 115*  CO2 22 22 20*  GLUCOSE 114* 112* 119*  BUN CREATININE 0.72 0.72 0.54  CALCIUM 9.3 8.2* 7.6*   Liver Function Tests:  Recent Labs Lab 08/24/15 1132 08/25/15 0010 08/26/15 0445  AST 18 22 51*  ALT 18 18 34  ALKPHOS 81 62 59  BILITOT 0.6 0.7 0.2*  PROT 7.8 6.6 5.6*  ALBUMIN 3.8 3.2* 2.5*   No results for input(s): LIPASE, AMYLASE in the last 168 hours. No results for input(s): AMMONIA in the last 168 hours. CBC:  Recent Labs Lab 08/24/15 1132 08/25/15 0010 08/26/15 0445  WBC 33.9* 26.0* 11.0*  NEUTROABS 31.5* 23.6*  --   HGB 16.3* 13.6 11.5*  HCT 44.8 38.9 33.5*  MCV 90.9 91.5 92.8  PLT 241 183 190   Cardiac Enzymes: No results  for input(s): CKTOTAL, CKMB, CKMBINDEX, TROPONINI in the last 168 hours. BNP (last 3 results) No results for input(s): BNP in the last 8760 hours.  ProBNP (last 3 results) No results for input(s): PROBNP in the last 8760 hours.  CBG: No results for input(s): GLUCAP in the last 168 hours.  Recent Results (from the past 240 hour(s))  Rapid strep screen (not at [facility])     Status: None   Collection Time: 08/24/15 11:46 AM  Result Value Ref Range Status   Streptococcus, Group A Screen (Direct) NEGATIVE NEGATIVE Final    Comment: (NOTE) A Rapid Antigen test may result negative if the antigen level in the sample is below the detection level of this test. The FDA has not cleared this test  as a stand-alone test therefore the rapid antigen negative result has reflexed to a Group A Strep culture.   Culture, Group A Strep     Status: None   Collection Time: 08/24/15 11:46 AM  Result Value Ref Range Status   Strep A Culture Negative  Final    Comment: (NOTE) Performed At: [facility] [address] [place], [place] [id number] Mila Homer MD [id number]   Culture, blood (routine x 2)     Status: None (Preliminary result)   Collection Time: 08/25/15 12:10 AM  Result Value Ref Range Status   Specimen Description BLOOD LEFT ANTECUBITAL  Final   Special Requests BOTTLES DRAWN AEROBIC AND ANAEROBIC 8CC EACH  Final   Culture NO GROWTH 2 DAYS  Final   Report Status PENDING  Incomplete  Culture, blood (routine x 2)     Status: None (Preliminary result)   Collection Time: 08/25/15 12:20 AM  Result Value Ref Range Status   Specimen Description BLOOD RIGHT ANTECUBITAL  Final   Special Requests BOTTLES DRAWN AEROBIC AND ANAEROBIC 8CC EACH  Final   Culture NO GROWTH 2 DAYS  Final   Report Status PENDING  Incomplete  MRSA PCR Screening     Status: None   Collection Time: 08/25/15 10:43 AM  Result Value Ref Range Status   MRSA by PCR NEGATIVE NEGATIVE Final    Comment:        The GeneXpert MRSA Assay (FDA approved for NASAL specimens only), is one component of a comprehensive MRSA colonization surveillance program. It is not intended to diagnose MRSA infection nor to guide or monitor treatment for MRSA infections.      Studies: No results found.  Scheduled Meds: . aspirin EC  81 mg Oral Daily  . enoxaparin (LOVENOX) injection  40 mg Subcutaneous Q24H  . levofloxacin  750 mg Oral Daily  . nicotine  21 mg Transdermal Daily  . pantoprazole  40 mg Oral Daily  . PARoxetine  20 mg Oral Daily   Continuous Infusions: . sodium chloride 150 mL/hr at 08/27/15 0543  . sodium chloride 1,000 mL (08/27/15 1134)    Principal Problem:   Severe sepsis with septic  shock (HCC) Active Problems:   CAP (community acquired pneumonia)   Anxiety   Gastroesophageal reflux disease   SIRS (systemic inflammatory response syndrome) (HCC)    Time spent: 25 minutes. Greater than 50% of this time was spent in direct contact with the patient coordinating care.    [name] Jan  [facility] Pager [phone]  If 7PM-7AM, please contact night-coverage at [email or web address], password [facility] 08/27/2015, 2:50 PM  LOS: 2 days

## 2015-08-28 ENCOUNTER — Encounter (HOSPITAL_COMMUNITY)
Admission: EM | Disposition: A | Discharge status: Home / Self Care | Payer: Self-pay | Source: Home / Self Care | Attending: Internal Medicine

## 2015-08-28 ENCOUNTER — Encounter (HOSPITAL_COMMUNITY): Payer: Self-pay | Admitting: Anesthesiology

## 2015-08-28 ENCOUNTER — Ambulatory Visit (HOSPITAL_COMMUNITY)
Admission: RE | Admit: 2015-08-28 | Payer: BLUE CROSS/BLUE SHIELD | Source: Ambulatory Visit | Admitting: Internal Medicine

## 2015-08-28 LAB — CBC
HCT: 34.5 % — ABNORMAL LOW (ref 36.0–46.0)
HEMOGLOBIN: 11.9 g/dL — AB (ref 12.0–15.0)
MCH: 31.2 pg (ref 26.0–34.0)
MCHC: 34.5 g/dL (ref 30.0–36.0)
MCV: 90.6 fL (ref 78.0–100.0)
PLATELETS: 262 10*3/uL (ref 150–400)
RBC: 3.81 MIL/uL — ABNORMAL LOW (ref 3.87–5.11)
RDW: 13.3 % (ref 11.5–15.5)
WBC: 9.5 10*3/uL (ref 4.0–10.5)

## 2015-08-28 SURGERY — COLONOSCOPY WITH PROPOFOL
Anesthesia: Monitor Anesthesia Care

## 2015-08-28 MED ORDER — LEVOFLOXACIN 750 MG PO TABS: 750.0000 mg | ORAL_TABLET | Freq: Every day | ORAL | Status: DC

## 2015-08-28 NOTE — Discharge Summary (Signed)
Physician Discharge Summary  Sharon Gay ZOX:[id number] DOB: Jun 22, 1966 DOA: 08/24/2015  PCP: Cassell Smiles., MD  Admit date: 08/24/2015 Discharge date: 08/28/2015  Time spent: 45 minutes  Recommendations for Outpatient Follow-up:  -We'll be discharged home today. -Advised to follow-up with primary care provider in 2 weeks. -We'll recommend repeat chest x-ray in 4-6 weeks to ensure complete resolution of pneumonia. -Has 5 days of Levaquin remaining at time of discharge.   Discharge Diagnoses:  Principal Problem:   Severe sepsis with septic shock (HCC) Active Problems:   CAP (community acquired pneumonia)   Anxiety   Gastroesophageal reflux disease   SIRS (systemic inflammatory response syndrome) (HCC)   Discharge Condition: Stable and improved  Filed Weights   08/24/15 2308 08/25/15 0908 08/26/15 0506  Weight: 57.153 kg (126 lb) 62 kg (136 lb 11 oz) 61.5 kg (135 lb 9.3 oz)    History of present illness:  As per Dr. Sharl Ma 12/16: Today came to the ED with chief complaint of generalized body aches, nasal congestion and cough. Symptoms started on December 13 at that time she had fever with temp of 100.3. Patient was seen in the ED yesterday and was discharged home after she responded to IV for boluses. Although workup was negative for infectious process. Patient did have white count of 33,000 which was thought to be due to hemoconcentration. Today patient came back to the hospital with not much improvement. Continues to have generalized body aches coughing up white colored phlegm. Denies shortness of breath. White count today is 26,000, patient was hypotensive in the ED requiring IV for boluses. Lactic acid is normal 0.8 Chest x-ray shows chronic bronchitis changes but no acute process. UA is clear. 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  Patient also has been having  vague abdominal pain and has been seen by gastroenterology as outpatient. Today also she complains of generalized abdominal pain. Mono screen was positive in the ED.  Hospital Course:   Severe sepsis with septic shock -Fluid responsive. -No need for pressors. -Resolved.  Community-acquired pneumonia -Is afebrile, leukocytosis markedly improved from 34-9.5 on DC. -Continue Levaquin will plan for a 7 day course. She is tolerating this despite her allergy to Tequin.  Chronic pain/anxiety disorder -Continue Paxil, Xanax -No narcotics given at time of DC.   Procedures:  None   Consultations:  None  Discharge Instructions  Discharge Instructions    Increase activity slowly    Complete by:  As directed             Medication List    STOP taking these medications        HYDROcodone-acetaminophen 5-325 MG tablet  Commonly known as:  NORCO/VICODIN     polyethylene glycol-electrolytes 420 G solution  Commonly known as:  NuLYTELY/GoLYTELY      TAKE these medications        albuterol 108 (90 BASE) MCG/ACT inhaler  Commonly known as:  PROVENTIL HFA;VENTOLIN HFA  Inhale 1-2 puffs into the lungs every 6 (six) hours as needed for wheezing or shortness of breath.     ALPRAZolam 1 MG tablet  Commonly known as:  XANAX  Take 1 mg by mouth as directed. Take 1-2 as needed anxiety     aspirin 81 MG tablet  Take 81 mg by mouth daily.     dexlansoprazole 60 MG capsule  Commonly known as:  DEXILANT  Take 1 capsule (60  mg total) by mouth daily.     dextromethorphan-guaiFENesin 30-600 MG 12hr tablet  Commonly known as:  MUCINEX DM  Take 1 tablet by mouth 2 (two) times daily.     ibuprofen 600 MG tablet  Commonly known as:  ADVIL,MOTRIN  Take 1 tablet (600 mg total) by mouth every 6 (six) hours as needed for fever or moderate pain.     levofloxacin 750 MG tablet  Commonly known as:  LEVAQUIN  Take 1 tablet (750 mg total) by mouth daily.     oxyCODONE-acetaminophen 5-325  MG tablet  Commonly known as:  PERCOCET/ROXICET  Take 1 tablet by mouth every 4 (four) hours as needed for moderate pain.     pantoprazole 40 MG tablet  Commonly known as:  PROTONIX  Take 40 mg by mouth daily.     PARoxetine 20 MG tablet  Commonly known as:  PAXIL  Take 20 mg by mouth daily.     promethazine 25 MG tablet  Commonly known as:  PHENERGAN  Take 25 mg by mouth every 6 (six) hours as needed for nausea or vomiting.       Allergies  Allergen Reactions  . Penicillins Anaphylaxis and Swelling    Has patient had a PCN reaction causing immediate rash, facial/tongue/throat swelling, SOB or lightheadedness with hypotension: No Has patient had a PCN reaction causing severe rash involving mucus membranes or skin necrosis: No Has patient had a PCN reaction that required hospitalization No Has patient had a PCN reaction occurring within the last 10 years: No If all of the above answers are "NO", then may proceed with Cephalosporin use.   Marland Kitchen Morphine     REACTION: hallucinations  . Rifampin     REACTION: skin yellowed,blisters around lips and tongue, urine was orange in color,nausea  . Tequin [Gatifloxacin]        Follow-up Information    Schedule an appointment as soon as possible for a visit with Cassell Smiles., MD.   Specialty:  Internal Medicine   Why:  Call today for a Monday follow up appointment.   Contact information:   [address] [place] [address] [phone]        The results of significant diagnostics from this hospitalization (including imaging, microbiology, ancillary and laboratory) are listed below for reference.    Significant Diagnostic Studies: Dg Chest 2 View  08/25/2015  CLINICAL DATA:  Fever, headaches, chills, muscle soreness, productive cough for 2 days. History of COPD and MS. Right chest pain. EXAM: CHEST  2 VIEW COMPARISON:  08/24/2015 FINDINGS: Mild hyperinflation. Peribronchial thickening and central interstitial changes  likely due to chronic bronchitis. Normal heart size and pulmonary vascularity. No focal airspace disease or consolidation in the lungs. No blunting of costophrenic angles. No pneumothorax. Mediastinal contours appear intact. IMPRESSION: Emphysematous changes and chronic bronchitic changes in the chest. No evidence of active pulmonary disease. Electronically Signed   By: Burman Nieves M.D.   On: 08/25/2015 00:33   Dg Chest 2 View  08/24/2015  CLINICAL DATA:  Cough, congestion, fever, body aches and chills since 08/22/2015, smoker, COPD, multiple sclerosis EXAM: CHEST  2 VIEW COMPARISON:  06/14/2015 FINDINGS: Normal heart size, mediastinal contours, and pulmonary vascularity. Lungs emphysematous with minimal central peribronchial thickening compatible with COPD. No infiltrate, pleural effusion or pneumothorax. Osseous structures unremarkable. IMPRESSION: COPD changes. No acute abnormalities. Electronically Signed   By: Ulyses Southward M.D.   On: 08/24/2015 12:32   Ct Chest W Contrast  08/25/2015  CLINICAL  DATA:  49 year old female with fever, leukocytosis, headache, back pain, chills, muscle 8, cough, sore throat. Symptoms for 3 days. Decline to drink oral contrast. Initial encounter. Multiple sclerosis. EXAM: CT CHEST, ABDOMEN, AND PELVIS WITH CONTRAST TECHNIQUE: Multidetector CT imaging of the chest, abdomen and pelvis was performed following the standard protocol during bolus administration of intravenous contrast. CONTRAST:  OMNIPAQUE IOHEXOL 300 MG/ML  SOLN COMPARISON:  Chest radiographs 0010 hours today. Chest CT 04/25/2009 and earlier. Body CT 08/19/2004. FINDINGS: CT CHEST FINDINGS Confluent consolidation in the right lower lobe medial and posterior basal segments. Air bronchograms. Trace layering right pleural effusion. Peri-bronchovascular opacity in the inferior right upper lobe (series 8, image 27). Nodular peri-bronchovascular opacity also in the medial and posterior basal segments of the  left lower lobe. No additional consolidation. No left pleural effusion. Central airways remain patent. Reactive appearing mediastinal lymphadenopathy; mildly enlarged and hyper enhancing lymph nodes about the carina and right peritracheal nodal stations. No pericardial effusion. Negative thoracic inlet. No axillary lymphadenopathy. No acute osseous abnormality identified. Exaggerated thoracic kyphosis. CT ABDOMEN PELVIS FINDINGS No acute osseous abnormality identified. Chronic L5-S1 disc and endplate degeneration. Small volume pelvic free fluid. Uterus is either diminutive or absent (not visible on axial images but may be small and anteverted adjacent to the urinary bladder on sagittal image 47). Diminutive urinary bladder. Decompressed but redundant large bowel in the pelvis. Sigmoid retained stool. No distal large bowel wall thickening. Decompressed left colon. Redundant transverse colon. Fluid in the right colon and cecum without definite wall thickening. The cecum is mostly located in the right hemipelvis today. Ileocecal valve is best seen on coronal image 26. Decompressed terminal ileum. Appendix is not delineated today. No pericecal inflammation. No dilated small bowel. Multiple fluid containing decompressed pelvic small bowel loops. Trace amount of oral contrast in the stomach which is decompressed. Prominent gastric mucosal enhancement. Negative duodenum. Mild calcified aortic atherosclerosis. Major arterial structures in the abdomen and pelvis are patent. Spleen, pancreas, and adrenal glands are within normal limits. The liver has significantly enlarged compared to 2005, so much so that there is caudal displacement of the right kidney from the right renal fossa by enlarged right liver. Early liver enhancement is moderately heterogeneous. The IVC is enlarged but appears without filling defect on early and delayed postcontrast images. No perihepatic or abdominal free fluid. Portal venous system likewise  appears patent. Gallbladder within normal limits. Other than being displaced inferiorly since 2005, the right kidney appears normal. Bilateral renal enhancement and contrast excretion is within normal limits. No lymphadenopathy. IMPRESSION: 1. Acute pneumonia, primarily right lower lobe but with early spread of infection to the right upper and left lower lobes. Trace right pleural effusion. 2. Reactive mediastinal lymphadenopathy. 3. Enlarged and heterogeneously enhancing liver, new since 2005. Top differential considerations in this setting include acute hepatitis, drug toxicity, congestive hepatopathy (but there no associated cardiac changes). 4. Small volume nonspecific pelvic free fluid, nonspecific and might be physiologic. 5. No definite bowel inflammation. Electronically Signed   By: Odessa Fleming M.D.   On: 08/25/2015 08:43   Ct Abdomen Pelvis W Contrast  08/25/2015  CLINICAL DATA:  49 year old female with fever, leukocytosis, headache, back pain, chills, muscle 8, cough, sore throat. Symptoms for 3 days. Decline to drink oral contrast. Initial encounter. Multiple sclerosis. EXAM: CT CHEST, ABDOMEN, AND PELVIS WITH CONTRAST TECHNIQUE: Multidetector CT imaging of the chest, abdomen and pelvis was performed following the standard protocol during bolus administration of intravenous contrast. CONTRAST:   OMNIPAQUE IOHEXOL 300 MG/ML  SOLN COMPARISON:  Chest radiographs 0010 hours today. Chest CT 04/25/2009 and earlier. Body CT 08/19/2004. FINDINGS: CT CHEST FINDINGS Confluent consolidation in the right lower lobe medial and posterior basal segments. Air bronchograms. Trace layering right pleural effusion. Peri-bronchovascular opacity in the inferior right upper lobe (series 8, image 27). Nodular peri-bronchovascular opacity also in the medial and posterior basal segments of the left lower lobe. No additional consolidation. No left pleural effusion. Central airways remain patent. Reactive appearing mediastinal  lymphadenopathy; mildly enlarged and hyper enhancing lymph nodes about the carina and right peritracheal nodal stations. No pericardial effusion. Negative thoracic inlet. No axillary lymphadenopathy. No acute osseous abnormality identified. Exaggerated thoracic kyphosis. CT ABDOMEN PELVIS FINDINGS No acute osseous abnormality identified. Chronic L5-S1 disc and endplate degeneration. Small volume pelvic free fluid. Uterus is either diminutive or absent (not visible on axial images but may be small and anteverted adjacent to the urinary bladder on sagittal image 47). Diminutive urinary bladder. Decompressed but redundant large bowel in the pelvis. Sigmoid retained stool. No distal large bowel wall thickening. Decompressed left colon. Redundant transverse colon. Fluid in the right colon and cecum without definite wall thickening. The cecum is mostly located in the right hemipelvis today. Ileocecal valve is best seen on coronal image 26. Decompressed terminal ileum. Appendix is not delineated today. No pericecal inflammation. No dilated small bowel. Multiple fluid containing decompressed pelvic small bowel loops. Trace amount of oral contrast in the stomach which is decompressed. Prominent gastric mucosal enhancement. Negative duodenum. Mild calcified aortic atherosclerosis. Major arterial structures in the abdomen and pelvis are patent. Spleen, pancreas, and adrenal glands are within normal limits. The liver has significantly enlarged compared to 2005, so much so that there is caudal displacement of the right kidney from the right renal fossa by enlarged right liver. Early liver enhancement is moderately heterogeneous. The IVC is enlarged but appears without filling defect on early and delayed postcontrast images. No perihepatic or abdominal free fluid. Portal venous system likewise appears patent. Gallbladder within normal limits. Other than being displaced inferiorly since 2005, the right kidney appears normal.  Bilateral renal enhancement and contrast excretion is within normal limits. No lymphadenopathy. IMPRESSION: 1. Acute pneumonia, primarily right lower lobe but with early spread of infection to the right upper and left lower lobes. Trace right pleural effusion. 2. Reactive mediastinal lymphadenopathy. 3. Enlarged and heterogeneously enhancing liver, new since 2005. Top differential considerations in this setting include acute hepatitis, drug toxicity, congestive hepatopathy (but there no associated cardiac changes). 4. Small volume nonspecific pelvic free fluid, nonspecific and might be physiologic. 5. No definite bowel inflammation. Electronically Signed   By: Odessa Fleming M.D.   On: 08/25/2015 08:43    Microbiology: Recent Results (from the past 240 hour(s))  Rapid strep screen (not at [facility])     Status: None   Collection Time: 08/24/15 11:46 AM  Result Value Ref Range Status   Streptococcus, Group A Screen (Direct) NEGATIVE NEGATIVE Final    Comment: (NOTE) A Rapid Antigen test may result negative if the antigen level in the sample is below the detection level of this test. The FDA has not cleared this test as a stand-alone test therefore the rapid antigen negative result has reflexed to a Group A Strep culture.   Culture, Group A Strep     Status: None   Collection Time: 08/24/15 11:46 AM  Result Value Ref Range Status   Strep A Culture Negative  Final  Comment: (NOTE) Performed At: [facility] [address] [place], [place] [id number] Mila Homer MD WU:[id number]   Culture, blood (routine x 2)     Status: None (Preliminary result)   Collection Time: 08/25/15 12:10 AM  Result Value Ref Range Status   Specimen Description BLOOD LEFT ANTECUBITAL  Final   Special Requests BOTTLES DRAWN AEROBIC AND ANAEROBIC 8CC EACH  Final   Culture NO GROWTH 3 DAYS  Final   Report Status PENDING  Incomplete  Culture, blood (routine x 2)     Status: None (Preliminary result)   Collection  Time: 08/25/15 12:20 AM  Result Value Ref Range Status   Specimen Description BLOOD RIGHT ANTECUBITAL  Final   Special Requests BOTTLES DRAWN AEROBIC AND ANAEROBIC 8CC EACH  Final   Culture NO GROWTH 3 DAYS  Final   Report Status PENDING  Incomplete  MRSA PCR Screening     Status: None   Collection Time: 08/25/15 10:43 AM  Result Value Ref Range Status   MRSA by PCR NEGATIVE NEGATIVE Final    Comment:        The GeneXpert MRSA Assay (FDA approved for NASAL specimens only), is one component of a comprehensive MRSA colonization surveillance program. It is not intended to diagnose MRSA infection nor to guide or monitor treatment for MRSA infections.      Labs: Basic Metabolic Panel:  Recent Labs Lab 08/24/15 1132 08/25/15 0010 08/26/15 0445  NA 133* 132* 140  K 3.3* 3.3* 3.5  CL 100* 104 115*  CO2 22 22 20*  GLUCOSE 114* 112* 119*  BUN CREATININE 0.72 0.72 0.54  CALCIUM 9.3 8.2* 7.6*   Liver Function Tests:  Recent Labs Lab 08/24/15 1132 08/25/15 0010 08/26/15 0445  AST 18 22 51*  ALT 18 18 34  ALKPHOS 81 62 59  BILITOT 0.6 0.7 0.2*  PROT 7.8 6.6 5.6*  ALBUMIN 3.8 3.2* 2.5*   No results for input(s): LIPASE, AMYLASE in the last 168 hours. No results for input(s): AMMONIA in the last 168 hours. CBC:  Recent Labs Lab 08/24/15 1132 08/25/15 0010 08/26/15 0445 08/28/15 0549  WBC 33.9* 26.0* 11.0* 9.5  NEUTROABS 31.5* 23.6*  --   --   HGB 16.3* 13.6 11.5* 11.9*  HCT 44.8 38.9 33.5* 34.5*  MCV 90.9 91.5 92.8 90.6  PLT 241 183 190 262   Cardiac Enzymes: No results for input(s): CKTOTAL, CKMB, CKMBINDEX, TROPONINI in the last 168 hours. BNP: BNP (last 3 results) No results for input(s): BNP in the last 8760 hours.  ProBNP (last 3 results) No results for input(s): PROBNP in the last 8760 hours.  CBG: No results for input(s): GLUCAP in the last 168 hours.     SignedChaya Jan  [facility] Pager:  [phone] 08/28/2015, 2:05 PM

## 2015-08-28 NOTE — Progress Notes (Signed)
Patient was scheduled as outpatient for colonoscopy/EGD/dilation today. However, she has been admitted with sepsis, CAP. Outpatient procedures cancelled. I briefly spoke with patient and informed her we would be seeing her as an outpatient and arranging these procedures again. She states "I feel awful" and "it's a mess". Appears to be slowly clinically improving since admission. CT reviewed from this admission with enlarged and heterogeneously enhancing liver, new finding since 2005. AST was mildly elevated at 51 on one occasion a few days ago but otherwise LFTs are normal. Will pursue further work-up of this as outpatient. Will arrange outpatient follow-up with our office for reassessment prior to rescheduling these procedures.   Nira Retort, ANP-BC [facility]

## 2015-08-28 NOTE — Care Management Note (Signed)
Case Management Note  Patient Details  Name: EUSTACIA KUBAS MRN: [id number] Date of Birth: 29-Nov-1965  Subjective/Objective:                  Pt admitted from home with sepsis. Pt lives with family and will return home at discharge. Pt is independent with ADL's.  Action/Plan: No CM needs noted. Pt discharged home today.  Expected Discharge Date:  08/27/15               Expected Discharge Plan:  Home/Self Care  In-House Referral:  NA  Discharge planning Services  CM Consult  Post Acute Care Choice:  NA Choice offered to:  NA  DME Arranged:    DME Agency:     HH Arranged:    HH Agency:     Status of Service:  Completed, signed off  Medicare Important Message Given:    Date Medicare IM Given:    Medicare IM give by:    Date Additional Medicare IM Given:    Additional Medicare Important Message give by:     If discussed at Long Length of Stay Meetings, dates discussed:    Additional Comments:  Cheryl Flash, RN 08/28/2015, 1:15 PM

## 2015-08-30 LAB — CULTURE, BLOOD (ROUTINE X 2)
CULTURE: NO GROWTH
Culture: NO GROWTH

## 2015-09-07 ENCOUNTER — Ambulatory Visit (INDEPENDENT_AMBULATORY_CARE_PROVIDER_SITE_OTHER): Payer: BLUE CROSS/BLUE SHIELD | Admitting: Gastroenterology

## 2015-09-07 ENCOUNTER — Other Ambulatory Visit: Payer: Self-pay

## 2015-09-07 ENCOUNTER — Encounter: Payer: Self-pay | Admitting: Gastroenterology

## 2015-09-07 VITALS — BP 113/65 | HR 70 | Temp 98.3°F | Ht 71.0 in | Wt 125.6 lb

## 2015-09-07 DIAGNOSIS — R591 Generalized enlarged lymph nodes: Secondary | ICD-10-CM

## 2015-09-07 DIAGNOSIS — R109 Unspecified abdominal pain: Secondary | ICD-10-CM

## 2015-09-07 DIAGNOSIS — R1030 Lower abdominal pain, unspecified: Secondary | ICD-10-CM | POA: Diagnosis not present

## 2015-09-07 DIAGNOSIS — K219 Gastro-esophageal reflux disease without esophagitis: Secondary | ICD-10-CM

## 2015-09-07 NOTE — Patient Instructions (Signed)
We have scheduled you for an ultrasound of your liver.   I have also ordered an ultrasound of your neck.   Please complete blood work today.  I will see you back in about 2-3 weeks to see how you are doing and reschedule the procedures.   Have a good New Year!

## 2015-09-07 NOTE — Progress Notes (Signed)
Referring Provider: Elfredia Nevins, MD Primary Care Physician:  Cassell Smiles., MD  Primary GI: Dr. Jena Gauss   Chief Complaint  Patient presents with  . Follow-up    HPI:   Sharon Gay is a 49 y.o. female presenting today with a history of abdominal pain and vomiting. Prior H.pylori positive serologies through PCP s/p treatment. No anemia on CBC, normal LFTs. She was seen Aug 09, 2015 and colonoscopy was scheduled due to history of polyps, documented weight loss, and vague abdominal pain. EGD also arranged due to persistent nausea, refractory GERD despite multiple trials of PPIs, solid food and pill dysphagia. Dexilant prescribed. However, colonoscopy/EGD had to be cancelled as she was admitted to [facility] with sepsis/septic shock, CAP. Enlarged and heterogeneously enhancing liver noted on CT during admission.   States she can't stomach the prep for the colonoscopy. Dr. Sherwood Gambler called in some more antibiotics. On Dexilant daily. Still nauseated. No constipation or diarrhea. Having sweats at night. Temp 101 a few nights ago. Slow to improve.   Previously underwent EGD by Dr. Jena Gauss in 2009 with normal esophagus, empiric dilation. Remote colonoscopy in 2006 with 3 diminutive polyps in ascending colon but path unknown  Past Medical History  Diagnosis Date  . Anxiety   . Migraines   . COPD (chronic obstructive pulmonary disease) (HCC)     11/2003: Normal PFTs ex minimal increased volumes and decreased the DLCO; Followed in Pulmonary clinic/ [facility]/ [place]   . Chest pain 2003    11/2007: Admission with unstable angina; normal coronary angiography  with [facility]; nl EF  . Mycobacterium avium complex (HCC) 2010    01/2009: + AFB stain on bronchial washings  . Gastroesophageal reflux disease 2009    Hiatal hernia; esophageal dilatation for Schatzki's ring in 04/2008; chronic constipation; irritable bowel syndrome  . Colitis 2005  . Hyperlipidemia 2009   Lipid profile 07/2012:227, 91, 56, 153  . Mitral valve prolapse   . MS (multiple sclerosis) (HCC)   . Mitral valve prolapse   . Lung disease 2010  . MS (multiple sclerosis) (HCC) 2001  . PONV (postoperative nausea and vomiting)     Past Surgical History  Procedure Laterality Date  . Vocal cord polyp removed  2003  . Orif radial fracture  2012    Left  . Fiberoptic bronchoscopy  2010  . Colonoscopy w/ polypectomy  2006    Dr. Jena Gauss: 3 diminutive polyps in descending colon, unknown path   . Esophagogastroduodenoscopy  2009    Dr. Jena Gauss: normal esophagus, small hiatal hernia, s/p empiric dilatilon   . Esophagogastroduodenoscopy  2003    Schatzki's ring s/p dilation    Current Outpatient Prescriptions  Medication Sig Dispense Refill  . albuterol (PROVENTIL HFA;VENTOLIN HFA) 108 (90 BASE) MCG/ACT inhaler Inhale 1-2 puffs into the lungs every 6 (six) hours as needed for wheezing or shortness of breath. 1 Inhaler 0  . ALPRAZolam (XANAX) 1 MG tablet Take 1 mg by mouth as directed. Take 1-2 as needed anxiety     . aspirin 81 MG tablet Take 81 mg by mouth daily.     Marland Kitchen dexlansoprazole (DEXILANT) 60 MG capsule Take 1 capsule (60 mg total) by mouth daily. 90 capsule 3  . dextromethorphan-guaiFENesin (MUCINEX DM) 30-600 MG 12hr tablet Take 1 tablet by mouth 2 (two) times daily. 14 tablet 1  . ibuprofen (ADVIL,MOTRIN) 600 MG tablet Take 1 tablet (600 mg total) by mouth every 6 (six) hours as needed  for fever or moderate pain. 30 tablet 0  . oxyCODONE-acetaminophen (PERCOCET/ROXICET) 5-325 MG per tablet Take 1 tablet by mouth every 4 (four) hours as needed for moderate pain.     . pantoprazole (PROTONIX) 40 MG tablet Take 40 mg by mouth daily.    Marland Kitchen PARoxetine (PAXIL) 20 MG tablet Take 20 mg by mouth daily.    . promethazine (PHENERGAN) 25 MG tablet Take 25 mg by mouth every 6 (six) hours as needed for nausea or vomiting.     Marland Kitchen levofloxacin (LEVAQUIN) 750 MG tablet Take 1 tablet (750 mg total) by  mouth daily. (Patient not taking: Reported on 09/07/2015) 5 tablet 0   No current facility-administered medications for this visit.    Allergies as of 09/07/2015 - Review Complete 09/07/2015  Allergen Reaction Noted  . Penicillins Anaphylaxis and Swelling 08/10/2012  . Morphine    . Rifampin  02/06/2010  . Tequin [gatifloxacin]  08/10/2012    Family History  Problem Relation Age of Onset  . Emphysema Maternal Grandfather     was a smoker  . Asthma Sister   . Heart disease Sister     Also brother; cardiomyopathy  . Heart attack Father 22  . Colon cancer Neg Hx     Social History   Social History  . Marital Status: Married    Spouse Name: N/A  . Number of Children: 2  . Years of Education: N/A   Occupational History  . Automotive    Social History Main Topics  . Smoking status: Current Every Day Smoker -- 0.50 packs/day for 30 years    Types: Cigarettes  . Smokeless tobacco: None     Comment: 09/2012: 4-5 cigerattes a week; using transdermal nicotine in quit attempt  . Alcohol Use: No  . Drug Use: No     Comment: Excessive caffeine- 10 cups of coffee per day  . Sexual Activity: Not Asked   Other Topics Concern  . None   Social History Narrative   2 stepchildren    Review of Systems: As mentioned in HPI   Physical Exam: BP 113/65 mmHg  Pulse 70  Temp(Src) 98.3 F (36.8 C) (Oral)  Ht  (1.803 m)  Wt 125 lb 9.6 oz (56.972 kg)  BMI 17.53 kg/m2 General:   Alert and oriented. No distress noted. Pleasant and cooperative.  Head:  Normocephalic and atraumatic. Eyes:  Conjuctiva clear without scleral icterus. Mouth:  Oral mucosa pink and moist. Good dentition. No lesions. Neck:  Left anterior cervical nodes more prominent than right, left supraclavicular node enlarged Heart:  S1, S2 present without murmurs, rubs, or gallops. Regular rate and rhythm. Abdomen:  +BS, soft, non-tender and non-distended. No rebound or guarding. No HSM or masses noted. Msk:   Symmetrical without gross deformities. Normal posture. Extremities:  Without edema. Neurologic:  Alert and  oriented x4;  grossly normal neurologically. Psych:  Alert and cooperative. Normal mood and affect.  Lab Results  Component Value Date   ALT 34 08/26/2015   AST 51* 08/26/2015   ALKPHOS 59 08/26/2015   BILITOT 0.2* 08/26/2015

## 2015-09-08 NOTE — Assessment & Plan Note (Signed)
Noted on cervical exam and reported also by patient historically as bothersome/worrisome. [place] of neck ordered.

## 2015-09-08 NOTE — Assessment & Plan Note (Signed)
49 year old female with vague lower abdominal pain, associated weight loss, no improvement in BMs. No rectal bleeding or FH of colon cancer. History of polyps in 2006, path unknown. Colonoscopy had been planned but was cancelled due to admission for septic shock and pneumonia. EGD was also planned due to persistent nausea and refractory GERD, solid food and pill dysphagia. Both procedures now placed on hold due to recent diagnosis of pneumonia and need for recovery from a respiratory standpoint. Return in several weeks to arrange TCS/EGD/dilation. Continue Dexilant daily.   As of note, enlarged liver on CT. Proceed with elastography and order Hep C antibody.

## 2015-09-08 NOTE — Assessment & Plan Note (Signed)
Continue Dexilant once daily. EGD with dilation at later date after recovered completely from pneumonia.

## 2015-09-12 NOTE — Progress Notes (Signed)
cc'ed to pcp °

## 2015-09-15 ENCOUNTER — Other Ambulatory Visit: Payer: Self-pay | Admitting: Gastroenterology

## 2015-09-15 ENCOUNTER — Ambulatory Visit (HOSPITAL_COMMUNITY)
Admission: RE | Admit: 2015-09-15 | Discharge: 2015-09-15 | Disposition: A | Discharge status: Home / Self Care | Payer: BLUE CROSS/BLUE SHIELD | Source: Ambulatory Visit | Attending: Gastroenterology | Admitting: Gastroenterology

## 2015-09-15 DIAGNOSIS — R109 Unspecified abdominal pain: Secondary | ICD-10-CM

## 2015-09-15 DIAGNOSIS — R590 Localized enlarged lymph nodes: Secondary | ICD-10-CM | POA: Insufficient documentation

## 2015-09-15 DIAGNOSIS — R591 Generalized enlarged lymph nodes: Secondary | ICD-10-CM

## 2015-09-15 LAB — CBC WITH DIFFERENTIAL/PLATELET
BASOS ABS: 0.1 10*3/uL (ref 0.0–0.1)
BASOS PCT: 1 % (ref 0–1)
EOS PCT: 4 % (ref 0–5)
Eosinophils Absolute: 0.3 10*3/uL (ref 0.0–0.7)
HEMATOCRIT: 42.9 % (ref 36.0–46.0)
Hemoglobin: 14.4 g/dL (ref 12.0–15.0)
LYMPHS PCT: 34 % (ref 12–46)
Lymphs Abs: 2.8 10*3/uL (ref 0.7–4.0)
MCH: 31.4 pg (ref 26.0–34.0)
MCHC: 33.6 g/dL (ref 30.0–36.0)
MCV: 93.7 fL (ref 78.0–100.0)
MPV: 9.8 fL (ref 8.6–12.4)
Monocytes Absolute: 0.6 10*3/uL (ref 0.1–1.0)
Monocytes Relative: 7 % (ref 3–12)
NEUTROS ABS: 4.5 10*3/uL (ref 1.7–7.7)
Neutrophils Relative %: 54 % (ref 43–77)
Platelets: 337 10*3/uL (ref 150–400)
RBC: 4.58 MIL/uL (ref 3.87–5.11)
RDW: 13.8 % (ref 11.5–15.5)
WBC: 8.3 10*3/uL (ref 4.0–10.5)

## 2015-09-15 LAB — COMPLETE METABOLIC PANEL WITH GFR
ALT: 22 U/L (ref 6–29)
AST: 29 U/L (ref 10–35)
Albumin: 4 g/dL (ref 3.6–5.1)
Alkaline Phosphatase: 69 U/L (ref 33–115)
BUN: 14 mg/dL (ref 7–25)
CALCIUM: 9.6 mg/dL (ref 8.6–10.2)
CHLORIDE: 101 mmol/L (ref 98–110)
CO2: 28 mmol/L (ref 20–31)
CREATININE: 0.72 mg/dL (ref 0.50–1.10)
GFR, Est African American: >89 mL/min (ref >=60)
GFR, Est Non African American: >89 mL/min (ref >=60)
GLUCOSE: 93 mg/dL (ref 65–99)
POTASSIUM: 4.6 mmol/L (ref 3.5–5.3)
SODIUM: 138 mmol/L (ref 135–146)
Total Bilirubin: 0.2 mg/dL (ref 0.2–1.2)
Total Protein: 6.8 g/dL (ref 6.1–8.1)

## 2015-09-16 ENCOUNTER — Other Ambulatory Visit: Payer: BLUE CROSS/BLUE SHIELD

## 2015-09-16 LAB — HEPATITIS C ANTIBODY: HCV Ab: NEGATIVE

## 2015-09-18 NOTE — Progress Notes (Signed)
Quick Note:  She needs a CT of her neck now. The lymph nodes are enlarged and concerning. Needs CT neck with contrast, and the radiologist recommends adding a CT chest at same time to evaluate due to prior pneumonia. ______

## 2015-09-18 NOTE — Progress Notes (Signed)
Quick Note:  Hep C antibody negative. CBC and CMP are normal. ______

## 2015-09-19 ENCOUNTER — Other Ambulatory Visit: Payer: Self-pay

## 2015-09-19 ENCOUNTER — Telehealth: Payer: Self-pay | Admitting: Internal Medicine

## 2015-09-19 DIAGNOSIS — R591 Generalized enlarged lymph nodes: Secondary | ICD-10-CM

## 2015-09-19 DIAGNOSIS — J189 Pneumonia, unspecified organism: Secondary | ICD-10-CM

## 2015-09-19 NOTE — Telephone Encounter (Signed)
Pt has ov with [facility] on 10/03/15 and U/S scheduled for 09/28/15 and ct is scheduled for 09/21/15. Per [facility] this is ok and no need to call pt.

## 2015-09-19 NOTE — Telephone Encounter (Signed)
Patient told radiology that she couldn't get out of her drive way

## 2015-09-19 NOTE — Telephone Encounter (Signed)
Earnstine Regal from [facility]ology called to let us know that patient has cancelled her U/S of abd and other tests she was scheduled for today. Per Clydie Braun this is about the 4th or 5th time that she has cancelled and wanted [facility] to be aware

## 2015-09-19 NOTE — Telephone Encounter (Signed)
Can we call the patient and see why?

## 2015-09-20 ENCOUNTER — Inpatient Hospital Stay (HOSPITAL_COMMUNITY): Admission: RE | Admit: 2015-09-20 | Payer: BLUE CROSS/BLUE SHIELD | Source: Ambulatory Visit

## 2015-09-20 ENCOUNTER — Ambulatory Visit (HOSPITAL_COMMUNITY): Payer: BLUE CROSS/BLUE SHIELD

## 2015-09-20 ENCOUNTER — Ambulatory Visit (HOSPITAL_COMMUNITY): Admission: RE | Admit: 2015-09-20 | Payer: BLUE CROSS/BLUE SHIELD | Source: Ambulatory Visit

## 2015-09-20 NOTE — Telephone Encounter (Signed)
Pt called to reschedule CT scan. I gave her the phone number to call so she can do that.

## 2015-09-21 ENCOUNTER — Ambulatory Visit (HOSPITAL_COMMUNITY)
Admission: RE | Admit: 2015-09-21 | Discharge: 2015-09-21 | Disposition: A | Discharge status: Home / Self Care | Payer: BLUE CROSS/BLUE SHIELD | Source: Ambulatory Visit | Attending: Gastroenterology | Admitting: Gastroenterology

## 2015-09-21 ENCOUNTER — Ambulatory Visit (HOSPITAL_COMMUNITY): Payer: BLUE CROSS/BLUE SHIELD

## 2015-09-21 DIAGNOSIS — R590 Localized enlarged lymph nodes: Secondary | ICD-10-CM | POA: Diagnosis not present

## 2015-09-21 DIAGNOSIS — J189 Pneumonia, unspecified organism: Secondary | ICD-10-CM

## 2015-09-21 DIAGNOSIS — R591 Generalized enlarged lymph nodes: Secondary | ICD-10-CM

## 2015-09-21 MED ORDER — IOHEXOL 300 MG/ML  SOLN
100.0000 mL | Freq: Once | INTRAMUSCULAR | Status: AC | PRN
  Administered 2015-09-21: 75 mL via INTRAVENOUS

## 2015-09-26 ENCOUNTER — Inpatient Hospital Stay: Admission: RE | Admit: 2015-09-26 | Payer: BLUE CROSS/BLUE SHIELD | Source: Ambulatory Visit

## 2015-09-28 ENCOUNTER — Ambulatory Visit (HOSPITAL_COMMUNITY): Admission: RE | Admit: 2015-09-28 | Payer: BLUE CROSS/BLUE SHIELD | Source: Ambulatory Visit

## 2015-09-28 ENCOUNTER — Other Ambulatory Visit (HOSPITAL_COMMUNITY): Payer: Self-pay | Admitting: Internal Medicine

## 2015-09-28 ENCOUNTER — Ambulatory Visit (HOSPITAL_COMMUNITY)
Admission: RE | Admit: 2015-09-28 | Discharge: 2015-09-28 | Disposition: A | Discharge status: Home / Self Care | Payer: BLUE CROSS/BLUE SHIELD | Source: Ambulatory Visit | Attending: Internal Medicine | Admitting: Internal Medicine

## 2015-09-28 ENCOUNTER — Ambulatory Visit (HOSPITAL_COMMUNITY): Payer: BLUE CROSS/BLUE SHIELD

## 2015-09-28 DIAGNOSIS — M5440 Lumbago with sciatica, unspecified side: Secondary | ICD-10-CM

## 2015-09-28 DIAGNOSIS — R0789 Other chest pain: Secondary | ICD-10-CM

## 2015-10-02 NOTE — Progress Notes (Signed)
Quick Note:  Good news. Pneumonia improving. Lymphadenopathy improving and likely was secondary to underlying illness. Will be seeing patient soon in clinic to set up for TCS/EGD. ______

## 2015-10-03 ENCOUNTER — Ambulatory Visit: Payer: BLUE CROSS/BLUE SHIELD | Admitting: Gastroenterology

## 2015-10-05 ENCOUNTER — Ambulatory Visit (HOSPITAL_COMMUNITY)
Admission: RE | Admit: 2015-10-05 | Discharge: 2015-10-05 | Disposition: A | Discharge status: Home / Self Care | Payer: BLUE CROSS/BLUE SHIELD | Source: Ambulatory Visit | Attending: Gastroenterology | Admitting: Gastroenterology

## 2015-10-05 DIAGNOSIS — R932 Abnormal findings on diagnostic imaging of liver and biliary tract: Secondary | ICD-10-CM | POA: Diagnosis not present

## 2015-10-05 DIAGNOSIS — R160 Hepatomegaly, not elsewhere classified: Secondary | ICD-10-CM | POA: Insufficient documentation

## 2015-10-05 DIAGNOSIS — R109 Unspecified abdominal pain: Secondary | ICD-10-CM | POA: Insufficient documentation

## 2015-10-10 ENCOUNTER — Ambulatory Visit: Admission: RE | Admit: 2015-10-10 | Payer: BLUE CROSS/BLUE SHIELD | Source: Ambulatory Visit

## 2015-10-12 ENCOUNTER — Ambulatory Visit (HOSPITAL_COMMUNITY): Payer: BLUE CROSS/BLUE SHIELD

## 2015-10-18 ENCOUNTER — Inpatient Hospital Stay
Admission: RE | Admit: 2015-10-18 | Discharge: 2015-10-18 | Disposition: A | Discharge status: Home / Self Care | Payer: BLUE CROSS/BLUE SHIELD | Source: Ambulatory Visit | Attending: Neurology | Admitting: Neurology

## 2015-10-20 ENCOUNTER — Encounter: Payer: Self-pay | Admitting: Gastroenterology

## 2015-10-20 ENCOUNTER — Other Ambulatory Visit: Payer: Self-pay

## 2015-10-20 ENCOUNTER — Ambulatory Visit (INDEPENDENT_AMBULATORY_CARE_PROVIDER_SITE_OTHER): Payer: BLUE CROSS/BLUE SHIELD | Admitting: Gastroenterology

## 2015-10-20 VITALS — BP 123/63 | HR 80 | Temp 97.6°F | Ht 71.0 in | Wt 135.2 lb

## 2015-10-20 DIAGNOSIS — R590 Localized enlarged lymph nodes: Secondary | ICD-10-CM

## 2015-10-20 DIAGNOSIS — R1310 Dysphagia, unspecified: Secondary | ICD-10-CM | POA: Diagnosis not present

## 2015-10-20 DIAGNOSIS — R591 Generalized enlarged lymph nodes: Secondary | ICD-10-CM

## 2015-10-20 DIAGNOSIS — Z86010 Personal history of colonic polyps: Secondary | ICD-10-CM | POA: Diagnosis not present

## 2015-10-20 DIAGNOSIS — R160 Hepatomegaly, not elsewhere classified: Secondary | ICD-10-CM

## 2015-10-20 NOTE — Patient Instructions (Signed)
We have referred you to an Ear, Nose, Throat Specialist.  We have also ordered a colonoscopy, upper endoscopy, and dilation with Dr. Jena Gauss in the near future.  I will be talking with my colleague about your chronic enlarged lymph nodes.   I will also ask Dr. Jena Gauss about the enlarged liver and further work-up for this.

## 2015-10-20 NOTE — Progress Notes (Signed)
Referring Provider: Elfredia Nevins, MD Primary Care Physician:  Cassell Smiles., MD  Primary GI: Dr. Jena Gauss   Chief Complaint  Patient presents with  . Follow-up    HPI:   Sharon Gay is a 50 y.o. female presenting today with a history of abdominal pain and vomiting. Prior H.pylori positive serologies through PCP s/p treatment. She had been seen in Nov 2016 and scheduled for colonoscopy due to history of polyps, weight loss, and vague abdominal pain. EGD was to be performed as well secondary to refractory GERD, solid food and pill dysphagia. Unfortunately, she was admitted to [facility] with sepsis in the setting of CAP. Enlarged and heterogeneously enhancing liver noted on CT during admission. She continues to have vague symptoms of night sweats, intermittent cervical lymphadenopathy. She actually had a neck ultrasound that showed enlarged bilateral cervical lymph nodes. CT neck then completed with likely reactive cervical lymph nodes in setting of recent pneumonia. However, she states this lymphadenopathy is CHRONIC. [place] with elastography completed with F2/F3 score. Hep C antibody negative. CBC and CMP normal.   Drinks about 2 large starbucks cans a day (the sugar-filled ones). Eating milkshakes and ice cream at night to try and gain weight. Solid food and pill dysphagia.   States has intermittent fevers, comes and goes. Chronic. Chronic cervical lymphadenopathy. Feels chronically fatigued. Wonders if she needs a liver biopsy. Wondering if she needs further work-up for lymphadenopathy. No constipation, diarrhea, or overt GI bleeding.   Previously underwent EGD by Dr. Jena Gauss in 2009 with normal esophagus, empiric dilation. Remote colonoscopy in 2006 with 3 diminutive polyps in ascending colon but path unknown.   Past Medical History  Diagnosis Date  . Anxiety   . Migraines   . COPD (chronic obstructive pulmonary disease) (HCC)     11/2003: Normal PFTs ex minimal increased volumes and  decreased the DLCO; Followed in Pulmonary clinic/ [facility]/ [place]   . Chest pain 2003    11/2007: Admission with unstable angina; normal coronary angiography  with [facility]; nl EF  . Mycobacterium avium complex (HCC) 2010    01/2009: + AFB stain on bronchial washings  . Gastroesophageal reflux disease 2009    Hiatal hernia; esophageal dilatation for Schatzki's ring in 04/2008; chronic constipation; irritable bowel syndrome  . Colitis 2005  . Hyperlipidemia 2009    Lipid profile 07/2012:227, 91, 56, 153  . Mitral valve prolapse   . MS (multiple sclerosis) (HCC)   . Mitral valve prolapse   . Lung disease 2010  . MS (multiple sclerosis) (HCC) 2001  . PONV (postoperative nausea and vomiting)     Past Surgical History  Procedure Laterality Date  . Vocal cord polyp removed  2003  . Orif radial fracture  2012    Left  . Fiberoptic bronchoscopy  2010  . Colonoscopy w/ polypectomy  2006    Dr. Jena Gauss: 3 diminutive polyps in descending colon, unknown path   . Esophagogastroduodenoscopy  2009    Dr. Jena Gauss: normal esophagus, small hiatal hernia, s/p empiric dilatilon   . Esophagogastroduodenoscopy  2003    Schatzki's ring s/p dilation    Current Outpatient Prescriptions  Medication Sig Dispense Refill  . albuterol (PROVENTIL HFA;VENTOLIN HFA) 108 (90 BASE) MCG/ACT inhaler Inhale 1-2 puffs into the lungs every 6 (six) hours as needed for wheezing or shortness of breath. 1 Inhaler 0  . ALPRAZolam (XANAX) 1 MG tablet Take 1 mg by mouth as directed. Take 1-2 as needed anxiety     .  aspirin 81 MG tablet Take 81 mg by mouth daily.     Marland Kitchen dexlansoprazole (DEXILANT) 60 MG capsule Take 1 capsule (60 mg total) by mouth daily. 90 capsule 3  . dextromethorphan-guaiFENesin (MUCINEX DM) 30-600 MG 12hr tablet Take 1 tablet by mouth 2 (two) times daily. 14 tablet 1  . ibuprofen (ADVIL,MOTRIN) 600 MG tablet Take 1 tablet (600 mg total) by mouth every 6 (six) hours as needed for  fever or moderate pain. 30 tablet 0  . oxyCODONE-acetaminophen (PERCOCET/ROXICET) 5-325 MG per tablet Take 1 tablet by mouth every 4 (four) hours as needed for moderate pain.     . pantoprazole (PROTONIX) 40 MG tablet Take 40 mg by mouth daily.    . promethazine (PHENERGAN) 25 MG tablet Take 25 mg by mouth every 6 (six) hours as needed for nausea or vomiting.     Marland Kitchen levofloxacin (LEVAQUIN) 750 MG tablet Take 1 tablet (750 mg total) by mouth daily. (Patient not taking: Reported on 10/20/2015) 5 tablet 0  . PARoxetine (PAXIL) 20 MG tablet Take 20 mg by mouth daily.     No current facility-administered medications for this visit.    Allergies as of 10/20/2015 - Review Complete 10/20/2015  Allergen Reaction Noted  . Penicillins Anaphylaxis and Swelling 08/10/2012  . Morphine    . Rifampin  02/06/2010  . Tequin [gatifloxacin]  08/10/2012    Family History  Problem Relation Age of Onset  . Emphysema Maternal Grandfather     was a smoker  . Asthma Sister   . Heart disease Sister     Also brother; cardiomyopathy  . Heart attack Father 47  . Colon cancer Neg Hx     Social History   Social History  . Marital Status: Married    Spouse Name: N/A  . Number of Children: 2  . Years of Education: N/A   Occupational History  . Automotive    Social History Main Topics  . Smoking status: Current Every Day Smoker -- 0.50 packs/day for 30 years    Types: Cigarettes  . Smokeless tobacco: None     Comment: 09/2012: 4-5 cigerattes a week; using transdermal nicotine in quit attempt  . Alcohol Use: No  . Drug Use: No     Comment: Excessive caffeine- 10 cups of coffee per day  . Sexual Activity: Not Asked   Other Topics Concern  . None   Social History Narrative   2 stepchildren    Review of Systems: As mentioned in HPI   Physical Exam: BP 123/63 mmHg  Pulse 80  Temp(Src) 97.6 F (36.4 C) (Oral)  Ht 5\' 11"  (1.803 m)  Wt 135 lb 3.2 oz (61.326 kg)  BMI 18.86 kg/m2 General:   Alert  and oriented. No distress noted. Pleasant and cooperative.  Head:  Normocephalic and atraumatic. Eyes:  Conjuctiva clear without scleral icterus. Mouth:  Oral mucosa pink and moist. Good dentition. No lesions. Neck:  Supple, without mass or thyromegaly. Heart:  S1, S2 present without murmurs, rubs, or gallops. Regular rate and rhythm. Abdomen:  +BS, soft, non-tender and non-distended. No rebound or guarding.  Msk:  Symmetrical without gross deformities. Normal posture. Extremities:  Without edema. Neurologic:  Alert and  oriented x4;  grossly normal neurologically. Psych:  Alert and cooperative. Normal mood and affect.  Lab Results  Component Value Date   WBC 8.3 09/15/2015   HGB 14.4 09/15/2015   HCT 42.9 09/15/2015   MCV 93.7 09/15/2015   PLT 337 09/15/2015  Lab Results  Component Value Date   ALT 22 09/15/2015   AST 29 09/15/2015   ALKPHOS 69 09/15/2015   BILITOT 0.2 09/15/2015   Lab Results  Component Value Date   CREATININE 0.72 09/15/2015   BUN 14 09/15/2015   NA 138 09/15/2015   K 4.6 09/15/2015   CL 101 09/15/2015   CO2 28 09/15/2015

## 2015-10-23 ENCOUNTER — Ambulatory Visit (HOSPITAL_COMMUNITY): Payer: BLUE CROSS/BLUE SHIELD

## 2015-10-26 ENCOUNTER — Encounter: Payer: Self-pay | Admitting: *Deleted

## 2015-10-31 ENCOUNTER — Encounter: Payer: Self-pay | Admitting: Adult Health

## 2015-10-31 ENCOUNTER — Other Ambulatory Visit (HOSPITAL_COMMUNITY)
Admission: RE | Admit: 2015-10-31 | Discharge: 2015-10-31 | Disposition: A | Discharge status: Home / Self Care | Payer: BLUE CROSS/BLUE SHIELD | Source: Ambulatory Visit | Attending: Adult Health | Admitting: Adult Health

## 2015-10-31 ENCOUNTER — Ambulatory Visit (INDEPENDENT_AMBULATORY_CARE_PROVIDER_SITE_OTHER): Payer: BLUE CROSS/BLUE SHIELD | Admitting: Adult Health

## 2015-10-31 VITALS — BP 110/62 | HR 72 | Ht 67.5 in | Wt 136.0 lb

## 2015-10-31 DIAGNOSIS — F17200 Nicotine dependence, unspecified, uncomplicated: Secondary | ICD-10-CM

## 2015-10-31 DIAGNOSIS — Z780 Asymptomatic menopausal state: Secondary | ICD-10-CM

## 2015-10-31 DIAGNOSIS — Z01419 Encounter for gynecological examination (general) (routine) without abnormal findings: Secondary | ICD-10-CM | POA: Diagnosis present

## 2015-10-31 DIAGNOSIS — Z1212 Encounter for screening for malignant neoplasm of rectum: Secondary | ICD-10-CM

## 2015-10-31 DIAGNOSIS — Z1151 Encounter for screening for human papillomavirus (HPV): Secondary | ICD-10-CM | POA: Diagnosis present

## 2015-10-31 DIAGNOSIS — Z87891 Personal history of nicotine dependence: Secondary | ICD-10-CM | POA: Insufficient documentation

## 2015-10-31 DIAGNOSIS — F17210 Nicotine dependence, cigarettes, uncomplicated: Secondary | ICD-10-CM | POA: Insufficient documentation

## 2015-10-31 HISTORY — DX: Nicotine dependence, unspecified, uncomplicated: F17.200

## 2015-10-31 HISTORY — DX: Asymptomatic menopausal state: Z78.0

## 2015-10-31 LAB — HEMOCCULT GUIAC POC 1CARD (OFFICE): FECAL OCCULT BLD: NEGATIVE

## 2015-10-31 NOTE — Assessment & Plan Note (Signed)
Per patient, this is chronic. Cervical lymphadenopathy has been appreciate on prior exam by myself, with subsequent ultrasound neck and CT neck noting likely reactive lymphadenopathy in setting of recent pneumonia. However, she notes this as chronic, for several years. Refer to ENT now. Will also reach out to Hem/Onc to see if she needs to be seen by them as well due to chronic symptoms, chronic subjective fevers, night sweats, and weight loss. This is quite concerning for a possible underlying malignancy.

## 2015-10-31 NOTE — Assessment & Plan Note (Signed)
Solid food and pill dysphagia in the setting of GERD. Last dilation performed in 2009 by Dr. Jena Gauss. Continue Dexilant for now and pursue EGD with dilation at time of colonoscopy.   Proceed with upper endoscopy/dilation in the near future with Dr. Jena Gauss. The risks, benefits, and alternatives have been discussed in detail with patient. They have stated understanding and desire to proceed.  PROPOFOL due to polypharmacy Continue Dexilant

## 2015-10-31 NOTE — Progress Notes (Addendum)
Discussed with Dr. Jena Gauss. Let's go ahead and set her up for a transjugular liver biopsy with hepatic wedge gradient pressure measurements. Diagnosis: hepatomegaly.  Also, has she seen ENT yet? I discussed briefly with Jenita Seashore, PA, the lymphadenopathy, weight loss, night sweats, etc. She was supposed to be referred to ENT. IF they find a malignancy, then we can go from there. Does she have an appt? Thanks!

## 2015-10-31 NOTE — Patient Instructions (Signed)
Physical in 1 year, pap in 3 if normal Mammogram now and yearly [phone] Labs with PCP Colonoscopy 11/13/15 with Dr Jena Gauss

## 2015-10-31 NOTE — Progress Notes (Signed)
CC'ED TO PCP 

## 2015-10-31 NOTE — Assessment & Plan Note (Signed)
Noted on CT, with ultrasound elastography noting F2/F3 Metavir score. Hep C antibody negative. LFTs have remained normal. Query need for liver biopsy for further evaluation versus monitoring with serial ultrasound/elastography. Will discuss with Dr. Jena Gauss.

## 2015-10-31 NOTE — Assessment & Plan Note (Signed)
50 year old female with history of 3 diminutive colon polyps in 2006, unknown path. Weight loss noted, vague lower abdominal pain without acute findings on CT, no overt GI bleeding. No FH of colon cancer. With history of colon polyps, weight loss, and vague abdominal pain, will pursue colonoscopy now.   Proceed with TCS with Dr. Jena Gauss in near future: the risks, benefits, and alternatives have been discussed with the patient in detail. The patient states understanding and desires to proceed. PROPOFOL due to polypharmacy

## 2015-10-31 NOTE — Progress Notes (Addendum)
Patient ID: Sharon Gay, female   DOB: May 07, 1966, 50 y.o.   MRN: [id number] History of Present Illness: Sharon Gay is a 50 year old white female, married, G0P0 in for a well woman gyn exam and pap, she has MS, and had pneumonia in December and was in hospital for a week.She has body aches and has no libido, husband is disabled and just lays on the couch.  PCP is Dr Sherwood Gambler, [place] doctor is Dr Gerilyn Pilgrim and she sees Dr Juanetta Gosling and Dr Jena Gauss.   Current Medications, Allergies, Past Medical History, Past Surgical History, Family History and Social History were reviewed in Sharon Gay record.     Review of Systems: Patient denies any headaches, hearing loss, fatigue, blurred vision, shortness of breath, chest pain, abdominal pain, problems with bowel movements, urination(some SUI), not having sex. No mood swings, see HPI for positives.    Physical Exam:BP 110/62 mmHg  Pulse 72  Ht 5' 7.5" (1.715 m)  Wt 136 lb (61.689 kg)  BMI 20.97 kg/m2 General:  Well developed, well nourished, no acute distress Skin:  Warm and dry Neck:  Midline trachea, normal thyroid, good ROM, no lymphadenopathy Lungs; Clear to auscultation bilaterally Breast:  No dominant palpable mass, retraction, or nipple discharge Cardiovascular: Regular rate and rhythm Abdomen:  Soft, non tender, no hepatosplenomegaly Pelvic:  External genitalia is normal in appearance, no lesions.  The vagina has decreased color, moisture and rugae.  Uterus is felt to be normal size, shape, and contour.  No adnexal masses or tenderness noted.Bladder is non tender, no masses felt. Cervix is atrophic, pap with HPV performed. Rectal: Good sphincter tone, no polyps, or hemorrhoids felt.  Hemoccult negative. Extremities/musculoskeletal:  No swelling or varicosities noted, no clubbing or cyanosis Psych:  No mood changes, alert and cooperative,seems happy   Impression: Well woman gyn exam and pap PM Smoker     Plan: Physical in 1  year, pap in 3 if normal  Get mammogram now and yearly Labs with PCP Colonoscopy and EGD scheduled for 3/6 with Dr Jena Gauss  If has sex use good lubricate

## 2015-11-02 ENCOUNTER — Other Ambulatory Visit: Payer: Self-pay

## 2015-11-02 DIAGNOSIS — R160 Hepatomegaly, not elsewhere classified: Secondary | ICD-10-CM

## 2015-11-02 LAB — CYTOLOGY - PAP

## 2015-11-02 NOTE — Progress Notes (Signed)
ENT appt is 12/14/2015. (1st available)  Orders entered for IR liver bx.

## 2015-11-02 NOTE — Progress Notes (Signed)
Called pt [facility] regarding appt

## 2015-11-07 ENCOUNTER — Telehealth: Payer: Self-pay

## 2015-11-07 NOTE — Telephone Encounter (Signed)
Pt called to reschedule her TCS. I told her that we did not have anything in her 30 days of her office visit so she is going to have to come back in the office. She is schedule to come in on 11/20/15 @ 8:30 to see LSL

## 2015-11-07 NOTE — Telephone Encounter (Signed)
Noted  

## 2015-11-08 ENCOUNTER — Encounter (HOSPITAL_COMMUNITY): Payer: BLUE CROSS/BLUE SHIELD

## 2015-11-08 ENCOUNTER — Inpatient Hospital Stay (HOSPITAL_COMMUNITY): Admission: RE | Admit: 2015-11-08 | Payer: BLUE CROSS/BLUE SHIELD | Source: Ambulatory Visit

## 2015-11-13 ENCOUNTER — Ambulatory Visit (HOSPITAL_COMMUNITY)
Admission: RE | Admit: 2015-11-13 | Payer: BLUE CROSS/BLUE SHIELD | Source: Ambulatory Visit | Admitting: Internal Medicine

## 2015-11-13 ENCOUNTER — Telehealth: Payer: Self-pay | Admitting: Internal Medicine

## 2015-11-13 ENCOUNTER — Encounter (HOSPITAL_COMMUNITY): Admission: RE | Payer: Self-pay | Source: Ambulatory Visit

## 2015-11-13 SURGERY — COLONOSCOPY WITH PROPOFOL
Anesthesia: Monitor Anesthesia Care

## 2015-11-13 NOTE — Telephone Encounter (Signed)
Spoke with pt and informed her that there would be no OV is see the doctor doing the biopsy. Informed her that she can discuss options about how procedure will be informed upon check in.

## 2015-11-13 NOTE — Telephone Encounter (Signed)
Pt said that [facility] had referred her to a doctor to see why her liver was enlarged and she is scheduled for 3/8 and is concerned because she has not seen the doctor to discuss plan of care. She said that they were going to do a biopsy by going into a main artery in her neck and she is comfortable with that and would rather they go in through the groin. I transferred call to CJ to see if there was a number for the patient to call to voice her concerns and possibly meet the doctor prior to procedure.

## 2015-11-14 ENCOUNTER — Other Ambulatory Visit: Payer: Self-pay | Admitting: General Surgery

## 2015-11-14 NOTE — Patient Instructions (Signed)
Reviewed instructions NPO need for driver and meds reviewed.

## 2015-11-15 ENCOUNTER — Ambulatory Visit (HOSPITAL_COMMUNITY)
Admission: RE | Admit: 2015-11-15 | Discharge: 2015-11-15 | Disposition: A | Discharge status: Home / Self Care | Payer: BLUE CROSS/BLUE SHIELD | Source: Ambulatory Visit | Attending: Gastroenterology | Admitting: Gastroenterology

## 2015-11-15 ENCOUNTER — Telehealth: Payer: Self-pay | Admitting: Internal Medicine

## 2015-11-15 NOTE — Telephone Encounter (Signed)
Pt called to say that she is scheduled for a liver biopsy and has a fever of 99.9 and needed to reschedule. I gave her the number at [facility] and to ask for Interventional Radiology scheduling and they would get her rescheduled.

## 2015-11-20 ENCOUNTER — Ambulatory Visit: Payer: BLUE CROSS/BLUE SHIELD | Admitting: Gastroenterology

## 2015-11-20 ENCOUNTER — Other Ambulatory Visit: Payer: Self-pay | Admitting: General Surgery

## 2015-11-20 NOTE — Patient Instructions (Signed)
Review pre op instructions  For am procedure, denies questions, NPO after MN, needs driver am meds with sips and inhaler

## 2015-11-21 ENCOUNTER — Ambulatory Visit (HOSPITAL_COMMUNITY)
Admission: RE | Admit: 2015-11-21 | Discharge: 2015-11-21 | Disposition: A | Discharge status: Home / Self Care | Payer: BLUE CROSS/BLUE SHIELD | Source: Ambulatory Visit | Attending: Gastroenterology | Admitting: Gastroenterology

## 2015-11-21 ENCOUNTER — Encounter: Payer: Self-pay | Admitting: Diagnostic Radiology

## 2015-11-21 DIAGNOSIS — R160 Hepatomegaly, not elsewhere classified: Secondary | ICD-10-CM | POA: Insufficient documentation

## 2015-11-21 LAB — COMPREHENSIVE METABOLIC PANEL
ALBUMIN: 3.5 g/dL (ref 3.5–5.0)
ALT: 22 U/L (ref 14–54)
AST: 30 U/L (ref 15–41)
Alkaline Phosphatase: 68 U/L (ref 38–126)
Anion gap: 11 (ref 5–15)
BILIRUBIN TOTAL: 0.5 mg/dL (ref 0.3–1.2)
BUN: 11 mg/dL (ref 6–20)
CHLORIDE: 104 mmol/L (ref 101–111)
CO2: 26 mmol/L (ref 22–32)
Calcium: 9.4 mg/dL (ref 8.9–10.3)
Creatinine, Ser: 0.68 mg/dL (ref 0.44–1.00)
GFR calc Af Amer: >60 mL/min (ref >60)
GFR calc non Af Amer: >60 mL/min (ref >60)
GLUCOSE: 96 mg/dL (ref 65–99)
POTASSIUM: 4.3 mmol/L (ref 3.5–5.1)
SODIUM: 141 mmol/L (ref 135–145)
Total Protein: 6.4 g/dL — ABNORMAL LOW (ref 6.5–8.1)

## 2015-11-21 LAB — CBC
HEMATOCRIT: 41 % (ref 36.0–46.0)
Hemoglobin: 13.7 g/dL (ref 12.0–15.0)
MCH: 30.7 pg (ref 26.0–34.0)
MCHC: 33.4 g/dL (ref 30.0–36.0)
MCV: 91.9 fL (ref 78.0–100.0)
PLATELETS: 285 10*3/uL (ref 150–400)
RBC: 4.46 MIL/uL (ref 3.87–5.11)
RDW: 12.9 % (ref 11.5–15.5)
WBC: 7.8 10*3/uL (ref 4.0–10.5)

## 2015-11-21 LAB — PROTIME-INR
INR: 1.04 (ref 0.00–1.49)
Prothrombin Time: 13.8 seconds (ref 11.6–15.2)

## 2015-11-21 LAB — APTT: aPTT: 33 seconds (ref 24–37)

## 2015-11-21 MED ORDER — LIDOCAINE HCL 1 % IJ SOLN
INTRAMUSCULAR | Status: AC
Start: 2015-11-21 — End: 2015-11-21
  Filled 2015-11-21: qty 20

## 2015-11-21 MED ORDER — SODIUM CHLORIDE 0.9 % IV SOLN: INTRAVENOUS | Status: DC

## 2015-11-21 MED ORDER — MIDAZOLAM HCL 2 MG/2ML IJ SOLN
INTRAMUSCULAR | Status: DC
Start: 2015-11-21 — End: 2015-11-21
  Filled 2015-11-21: qty 6

## 2015-11-21 MED ORDER — FENTANYL CITRATE (PF) 100 MCG/2ML IJ SOLN
INTRAMUSCULAR | Status: AC
  Filled 2015-11-21: qty 4

## 2015-11-21 NOTE — Progress Notes (Signed)
Pt was here for Transjugular bx. Pt got on IR table and had panic attack when being covered with drape. Dr Lowella Dandy spoke with her-she sat up on IR table and was adamant that she couldn't do procedure. Stated she needs to be given some meds prior to coming into IR suite like they do when she has colonoscopy. MD stated we could give her IV Versed next time in RN holding bay area to assist with panic attacks and severe chlostrophobia. Pt became very tachycardic and upset when drape was placed. Pt was D/C home with sister.

## 2015-11-21 NOTE — H&P (Signed)
Chief Complaint: Patient was seen in consultation today for No chief complaint on file.  at the request of Nira Retort  Referring Physician(s): Nira Retort  Supervising Physician: Richarda Overlie  History of Present Illness: Sharon Gay is a 50 y.o. female who presented to GI c/o abdominal pain and vomiting.   She had been seen in Nov 2016 and scheduled for colonoscopy due to history of polyps, weight loss, and vague abdominal pain.   EGD was to be performed as well secondary to refractory GERD, solid food and pill dysphagia. Unfortunately, she was admitted to [facility] with sepsis due to pneumonia.   During the workup, an enlarged and heterogeneously enhancing liver noted on CT.   She continues to have vague symptoms of night sweats, intermittent cervical lymphadenopathy. She actually had a neck ultrasound that showed enlarged bilateral cervical lymph nodes. CT neck then completed with likely reactive cervical lymph nodes in setting of recent pneumonia. However, she states this lymphadenopathy is CHRONIC.   [place] with elastography completed with F2/F3 score.   Hep C antibody negative. CBC and CMP normal.   Dr. Jena Gauss has recommended a transjugular liver biopsy with hepatic wedge gradient pressure measurements.  She had fever last week, but this week she feels better and denies fever,chills.  She does not take blood thinners and she is NPO.   Past Medical History  Diagnosis Date  . Anxiety   . Migraines   . COPD (chronic obstructive pulmonary disease) (HCC)     11/2003: Normal PFTs ex minimal increased volumes and decreased the DLCO; Followed in Pulmonary clinic/ [facility]/ [place]   . Chest pain 2003    11/2007: Admission with unstable angina; normal coronary angiography  with [facility]; nl EF  . Mycobacterium avium complex (HCC) 2010    01/2009: + AFB stain on bronchial washings  . Gastroesophageal reflux disease 2009    Hiatal hernia; esophageal  dilatation for Schatzki's ring in 04/2008; chronic constipation; irritable bowel syndrome  . Colitis 2005  . Hyperlipidemia 2009    Lipid profile 07/2012:227, 91, 56, 153  . Mitral valve prolapse   . MS (multiple sclerosis) (HCC)   . Mitral valve prolapse   . Lung disease 2010  . MS (multiple sclerosis) (HCC) 2001  . PONV (postoperative nausea and vomiting)   . Back pain   . Depression   . Chronic pain   . Arthritis   . AC (acromioclavicular) joint bone spurs   . Smoker 10/31/2015  . History of pneumonia   . Postmenopausal 10/31/2015    Past Surgical History  Procedure Laterality Date  . Vocal cord polyp removed  2003  . Orif radial fracture  2012    Left  . Fiberoptic bronchoscopy  2010  . Colonoscopy w/ polypectomy  2006    Dr. Jena Gauss: 3 diminutive polyps in descending colon, unknown path   . Esophagogastroduodenoscopy  2009    Dr. Jena Gauss: normal esophagus, small hiatal hernia, s/p empiric dilatilon   . Esophagogastroduodenoscopy  2003    Schatzki's ring s/p dilation    Allergies: Penicillins; Morphine; Rifampin; Topamax; Cymbalta; and Tequin  Medications: Prior to Admission medications   Medication Sig Start Date End Date Taking? Authorizing Provider  albuterol (PROVENTIL HFA;VENTOLIN HFA) 108 (90 BASE) MCG/ACT inhaler Inhale 1-2 puffs into the lungs every 6 (six) hours as needed for wheezing or shortness of breath. 08/25/15  Yes Ivery Quale, PA-C  ALPRAZolam Prudy Feeler) 1 MG tablet Take 1 mg by  mouth 4 (four) times daily as needed for anxiety.   Yes Historical Provider, MD  aspirin EC 81 MG tablet Take 81 mg by mouth daily.   Yes Historical Provider, MD  Cholecalciferol (VITAMIN D3) 50000 units CAPS Take 1 capsule by mouth once a week.   Yes Historical Provider, MD  dexlansoprazole (DEXILANT) 60 MG capsule Take 60 mg by mouth daily.   Yes Historical Provider, MD  fluticasone (FLONASE) 50 MCG/ACT nasal spray Place 2 sprays into both nostrils 3 (three) times daily.   Yes  Historical Provider, MD  meloxicam (MOBIC) 7.5 MG tablet Take 7.5 mg by mouth 2 (two) times daily as needed for pain.   Yes Historical Provider, MD  oxycodone (ROXICODONE) 30 MG immediate release tablet Take 30-60 mg by mouth every 4 (four) hours as needed for pain.   Yes Historical Provider, MD  oxyCODONE-acetaminophen (PERCOCET) 10-325 MG tablet 1 tablet every 4 (four) hours as needed.  10/20/15  Yes Historical Provider, MD  PARoxetine (PAXIL) 20 MG tablet Take 20 mg by mouth daily.   Yes Historical Provider, MD  promethazine (PHENERGAN) 25 MG tablet Take 25 mg by mouth every 6 (six) hours as needed for nausea or vomiting.    Yes Historical Provider, MD  sodium chloride (OCEAN) 0.65 % SOLN nasal spray Place 1 spray into both nostrils as needed for congestion.   Yes Historical Provider, MD     Family History  Problem Relation Age of Onset  . Emphysema Maternal Grandfather     was a smoker  . Arthritis Sister   . Mental retardation Sister   . Heart disease Sister     Also brother; cardiomyopathy  . Thyroid disease Sister   . Fibromyalgia Sister   . Arthritis Sister   . Migraines Sister   . COPD Sister   . Heart attack Father 73  . Colon cancer Neg Hx   . Arthritis Sister   . Migraines Sister   . Other Sister     brain tumor  . Arthritis Brother   . Arthritis Brother     Social History   Social History  . Marital Status: Married    Spouse Name: N/A  . Number of Children: 2  . Years of Education: N/A   Occupational History  . Automotive    Social History Main Topics  . Smoking status: Current Every Day Smoker -- 0.00 packs/day for 30 years    Types: Cigarettes  . Smokeless tobacco: Not on file     Comment: smokes 4-5 cig daily  . Alcohol Use: No  . Drug Use: No  . Sexual Activity: Not Currently    Birth Control/ Protection: Post-menopausal   Other Topics Concern  . Not on file   Social History Narrative   2 stepchildren    Review of Systems: A 12 point ROS  discussed  Review of Systems  Constitutional: Positive for activity change, appetite change and fatigue. Negative for fever and chills.  HENT:       Cervical lymphadenopathy  Respiratory: Negative for cough, chest tightness and shortness of breath.   Cardiovascular: Negative for chest pain.  Gastrointestinal: Positive for abdominal pain. Negative for nausea and vomiting.  Genitourinary: Negative.   Musculoskeletal: Negative.   Skin: Negative.   Neurological: Negative.   Psychiatric/Behavioral: Negative.     Vital Signs: BP 113/70 mmHg  Pulse 68  Temp(Src) 97.7 F (36.5 C) (Oral)  Resp 18  Ht  (1.778 m)  Wt 134 lb (  60.782 kg)  BMI 19.23 kg/m2  SpO2 100%  Physical Exam  Constitutional: She is oriented to person, place, and time. She appears well-developed and well-nourished.  HENT:  Head: Normocephalic and atraumatic.  Eyes: EOM are normal.  Neck: Normal range of motion. Neck supple.  Cardiovascular: Normal rate, regular rhythm and normal heart sounds.   No murmur heard. Pulmonary/Chest: Effort normal and breath sounds normal. No respiratory distress. She has no wheezes.  Abdominal: Soft. Bowel sounds are normal.  Palpable liver  Musculoskeletal: Normal range of motion.  Neurological: She is alert and oriented to person, place, and time.  Skin: Skin is warm and dry.  Psychiatric: She has a normal mood and affect. Her behavior is normal. Judgment and thought content normal.  Vitals reviewed.   Mallampati Score:  MD Evaluation Airway: WNL Heart: WNL Abdomen: WNL Chest/ Lungs: WNL ASA  Classification: 2 Mallampati/Airway Score: One  Imaging: No results found.  Labs:  CBC:  Recent Labs  08/26/15 0445 08/28/15 0549 09/15/15 0939 11/21/15 0832  WBC 11.0* 9.5 8.3 7.8  HGB 11.5* 11.9* 14.4 13.7  HCT 33.5* 34.5* 42.9 41.0  PLT 190 262 337 285    COAGS:  Recent Labs  11/21/15 0832  INR 1.04  APTT 33    BMP:  Recent Labs  08/24/15 1132  08/25/15 0010 08/26/15 0445 09/15/15 0939  NA 133* 132* 140 138  K 3.3* 3.3* 3.5 4.6  CL 100* 104 115* 101  CO2 22 22 20* 28  GLUCOSE 114* 112* 119* 93  BUN 20 15 10 14   CALCIUM 9.3 8.2* 7.6* 9.6  CREATININE 0.72 0.72 0.54 0.72  GFRNONAA >60 >60 >60 >89  GFRAA >60 >60 >60 >89    LIVER FUNCTION TESTS:  Recent Labs  08/24/15 1132 08/25/15 0010 08/26/15 0445 09/15/15 0939  BILITOT 0.6 0.7 0.2* 0.2  AST 18 22 51* 29  ALT 18 18 34 22  ALKPHOS 81 62 59 69  PROT 7.8 6.6 5.6* 6.8  ALBUMIN 3.8 3.2* 2.5* 4.0    TUMOR MARKERS: No results for input(s): AFPTM, CEA, CA199, CHROMGRNA in the last 8760 hours.  Assessment and Plan:  Hepatomegaly  Will proceed with transjugular liver biopsy with hepatic wedge gradient pressure measurements  Thank you for this interesting consult.  I greatly enjoyed meeting RIKAYLA DEMMON and look forward to participating in their care.  A copy of this report was sent to the requesting provider on this date.  Electronically Signed: Gwynneth Macleod PA-C 11/21/2015, 9:34 AM   I spent a total of  30 Minutes in face to face in clinical consultation, greater than 50% of which was counseling/coordinating care for image guided transjugular liver biopsy.

## 2015-11-21 NOTE — Sedation Documentation (Signed)
Pt moved onto IR table for procedure. Drape was placed and pt had panic attack. Pt sat up- HR 130's, very upset- states "I can not do this today." Dr Lowella Dandy spoke with pt, she will reschedule and MD stated that we can give her Versed IV in holding room next time prior  to being moved into IR suite. Pt has been on Xanax 20 years and stated she will have to have strong med to get her calmer "prior" next time. Pt d/c home from RN holding area. VS stable.

## 2015-11-21 NOTE — Progress Notes (Unsigned)
Patient ID: Sharon Gay, female   DOB: 05-19-1966, 50 y.o.   MRN: [id number] Planned for transjugular liver biopsy with hepatic wedge pressures.  Patient was agreeable to procedure and informed consent was obtained.  Patient had a panic attack when she got into IR suite.  Patient was taken off the IR bed and I had a long conversation with patient.  At this point, the patient did not want to proceed with the procedure today.  Therefore, we will re-schedule and plan to start sedation prior to bringing patient into IR suited next time.

## 2015-11-29 ENCOUNTER — Telehealth: Payer: Self-pay

## 2015-11-29 NOTE — Telephone Encounter (Signed)
I have no idea. We need to check with the ones who did the biopsy.

## 2015-11-29 NOTE — Telephone Encounter (Signed)
Routing to ordering provider Tobi Bastos).

## 2015-11-29 NOTE — Telephone Encounter (Signed)
Pt called to let us know that she could not have the liver bx because they did not sedate her and she could not have it done. She wants to know if they can do the bx another way.please advise

## 2015-12-01 ENCOUNTER — Encounter: Payer: Self-pay | Admitting: Gastroenterology

## 2015-12-01 ENCOUNTER — Other Ambulatory Visit: Payer: Self-pay

## 2015-12-01 ENCOUNTER — Ambulatory Visit (INDEPENDENT_AMBULATORY_CARE_PROVIDER_SITE_OTHER): Payer: BLUE CROSS/BLUE SHIELD | Admitting: Gastroenterology

## 2015-12-01 VITALS — BP 111/65 | HR 70 | Temp 98.7°F | Ht 71.0 in | Wt 134.2 lb

## 2015-12-01 DIAGNOSIS — Z86010 Personal history of colonic polyps: Secondary | ICD-10-CM | POA: Diagnosis not present

## 2015-12-01 DIAGNOSIS — R1310 Dysphagia, unspecified: Secondary | ICD-10-CM | POA: Diagnosis not present

## 2015-12-01 DIAGNOSIS — R160 Hepatomegaly, not elsewhere classified: Secondary | ICD-10-CM | POA: Diagnosis not present

## 2015-12-01 DIAGNOSIS — K219 Gastro-esophageal reflux disease without esophagitis: Secondary | ICD-10-CM

## 2015-12-01 DIAGNOSIS — R1013 Epigastric pain: Secondary | ICD-10-CM

## 2015-12-01 NOTE — Telephone Encounter (Signed)
Pt is coming into the office today

## 2015-12-01 NOTE — Progress Notes (Signed)
Primary Care Physician:  Cassell Smiles., MD  Primary Gastroenterologist:  Roetta Sessions, MD   Chief Complaint  Patient presents with  . Colonoscopy    HPI:  Sharon Gay is a 50 y.o. female here for f/u. She was last seen in 10/2015. H/O 3 diminutive colon polyps back in 2006, unknown path. Weight loss (recently stabilize), vague abdominal pain, solid food and pill dysphagia. History of cervical lymphadenopathy, chronic. Noted to have hepatomegaly on CT. Ultrasound with elastography with Metavir F2/F3 score. Previous positive H. pylori serology status post treatment.  At time of last office visit, she was referred to ENT for lymphadenopathy, weight loss, night sweats. She is scheduled to go next month. She was arranged to have transjugular liver biopsy with hepatic wedge gradient pressure measurements for hepatomegaly the patient was unable to follow through the procedure. She states that she developed panic attacks when they started draping/prepping her for the procedure. States she did not have any medication on board at this point and she was unable to tolerate it. She refused the procedure at that point even though they were ready to sedate her. She states if she is able to tolerate the procedure, she will need to have some sort of sedation prior to going into the prep room.  Patient was supposed to have a colonoscopy EGD/ED last month that she had to reschedule because of her husband's doctor appointments. He is recently diagnosed with melanoma. Had to cancel her procedures last December because she was admitted with sepsis/community-acquired pneumonia.  She presents back today with ongoing complaints. Ongoing refractory GERD, solid food and pill dysphagia. Vague abdominal pain both epigastric and lower abd. Poor appetite. Complains of fatigue. No recent fevers. No constipation, diarrhea, melena, brbpr.   Current Outpatient Prescriptions  Medication Sig Dispense Refill  . albuterol  (PROVENTIL HFA;VENTOLIN HFA) 108 (90 BASE) MCG/ACT inhaler Inhale 1-2 puffs into the lungs every 6 (six) hours as needed for wheezing or shortness of breath. 1 Inhaler 0  . ALPRAZolam (XANAX) 1 MG tablet Take 1 mg by mouth 4 (four) times daily as needed for anxiety.    Marland Kitchen aspirin EC 81 MG tablet Take 81 mg by mouth daily.    . Cholecalciferol (VITAMIN D3) 50000 units CAPS Take 1 capsule by mouth once a week.    Marland Kitchen dexlansoprazole (DEXILANT) 60 MG capsule Take 60 mg by mouth daily.    . fluticasone (FLONASE) 50 MCG/ACT nasal spray Place 2 sprays into both nostrils 3 (three) times daily.    . meloxicam (MOBIC) 7.5 MG tablet Take 7.5 mg by mouth 2 (two) times daily as needed for pain.    Marland Kitchen oxycodone (ROXICODONE) 30 MG immediate release tablet Take 30-60 mg by mouth every 4 (four) hours as needed for pain.    Marland Kitchen oxyCODONE-acetaminophen (PERCOCET) 10-325 MG tablet 1 tablet every 4 (four) hours as needed.     Marland Kitchen PARoxetine (PAXIL) 20 MG tablet Take 20 mg by mouth daily.    . promethazine (PHENERGAN) 25 MG tablet Take 25 mg by mouth every 6 (six) hours as needed for nausea or vomiting.     . sodium chloride (OCEAN) 0.65 % SOLN nasal spray Place 1 spray into both nostrils as needed for congestion.     No current facility-administered medications for this visit.    Allergies as of 12/01/2015 - Review Complete 12/01/2015  Allergen Reaction Noted  . Penicillins Anaphylaxis, Nausea And Vomiting, and Swelling 08/10/2012  . Morphine Other (See Comments)   .  Rifampin Nausea Only and Other (See Comments) 02/06/2010  . Topamax [topiramate] Other (See Comments) 10/26/2015  . Cymbalta [duloxetine hcl] Other (See Comments) 10/26/2015  . Tequin [gatifloxacin] Nausea And Vomiting 08/10/2012    Past Medical History  Diagnosis Date  . Anxiety   . Migraines   . COPD (chronic obstructive pulmonary disease) (HCC)     11/2003: Normal PFTs ex minimal increased volumes and decreased the DLCO; Followed in Pulmonary  clinic/ [facility]/ [facility]   . Chest pain 2003    11/2007: Admission with unstable angina; normal coronary angiography  with [facility]; nl EF  . Mycobacterium avium complex (HCC) 2010    01/2009: + AFB stain on bronchial washings  . Gastroesophageal reflux disease 2009    Hiatal hernia; esophageal dilatation for Schatzki's ring in 04/2008; chronic constipation; irritable bowel syndrome  . Colitis 2005  . Hyperlipidemia 2009    Lipid profile 07/2012:227, 91, 56, 153  . Mitral valve prolapse   . MS (multiple sclerosis) (HCC)   . Mitral valve prolapse   . Lung disease 2010  . MS (multiple sclerosis) (HCC) 2001  . PONV (postoperative nausea and vomiting)   . Back pain   . Depression   . Chronic pain   . Arthritis   . AC (acromioclavicular) joint bone spurs   . Smoker 10/31/2015  . History of pneumonia   . Postmenopausal 10/31/2015    Past Surgical History  Procedure Laterality Date  . Vocal cord polyp removed  2003  . Orif radial fracture  2012    Left  . Fiberoptic bronchoscopy  2010  . Colonoscopy w/ polypectomy  2006    Dr. Jena Gauss: 3 diminutive polyps in descending colon, unknown path   . Esophagogastroduodenoscopy  2009    Dr. Jena Gauss: normal esophagus, small hiatal hernia, s/p empiric dilatilon   . Esophagogastroduodenoscopy  2003    Schatzki's ring s/p dilation    Family History  Problem Relation Age of Onset  . Emphysema Maternal Grandfather     was a smoker  . Arthritis Sister   . Mental retardation Sister   . Heart disease Sister     Also brother; cardiomyopathy  . Thyroid disease Sister   . Fibromyalgia Sister   . Arthritis Sister   . Migraines Sister   . COPD Sister   . Heart attack Father 71  . Colon cancer Neg Hx   . Arthritis Sister   . Migraines Sister   . Other Sister     brain tumor  . Arthritis Brother   . Arthritis Brother     Social History   Social History  . Marital Status: Married    Spouse Name: N/A  . Number  of Children: 2  . Years of Education: N/A   Occupational History  . Automotive    Social History Main Topics  . Smoking status: Current Every Day Smoker -- 0.00 packs/day for 30 years    Types: Cigarettes  . Smokeless tobacco: Not on file     Comment: smokes 4-5 cig daily  . Alcohol Use: No  . Drug Use: No  . Sexual Activity: Not Currently    Birth Control/ Protection: Post-menopausal   Other Topics Concern  . Not on file   Social History Narrative   2 stepchildren      ROS:  General: Negative for , fever, chills, fatigue, weakness. See history of present illness Eyes: Negative for vision changes.  ENT: Negative for hoarseness, difficulty swallowing ,  nasal congestion. CV: Negative for chest pain, angina, palpitations, dyspnea on exertion, peripheral edema.  Respiratory: Negative for dyspnea at rest, dyspnea on exertion, cough, sputum, wheezing.  GI: See history of present illness. GU:  Negative for dysuria, hematuria, urinary incontinence, urinary frequency, nocturnal urination.  MS: Negative for joint pain, low back pain.  Derm: Negative for rash or itching.  Neuro: Negative for weakness, abnormal sensation, seizure, frequent headaches, memory loss, confusion.  Psych: Negative for  depression, suicidal ideation, hallucinations. Positive for anxiety/panic attacks Endo: Negative for unusual weight change.  Heme: Negative for bruising or bleeding. Allergy: Negative for rash or hives.    Physical Examination:  BP 111/65 mmHg  Pulse 70  Temp(Src) 98.7 F (37.1 C) (Oral)  Ht 5\' 11"  (1.803 m)  Wt 134 lb 3.2 oz (60.873 kg)  BMI 18.73 kg/m2   General: Well-nourished, well-developed in no acute distress.  Head: Normocephalic, atraumatic.   Eyes: Conjunctiva pink, no icterus. Mouth: Oropharyngeal mucosa moist and pink , no lesions erythema or exudate. Neck: Supple without thyromegaly, masses, or lymphadenopathy.  Lungs: Clear to auscultation bilaterally.  Heart:  Regular rate and rhythm, no murmurs rubs or gallops.  Abdomen: Bowel sounds are normal, mild upper abdominal tenderness, nondistended, no hepatosplenomegaly or masses, no abdominal bruits or    hernia , no rebound or guarding.   Rectal: Not performed Extremities: No lower extremity edema. No clubbing or deformities.  Neuro: Alert and oriented x 4 , grossly normal neurologically.  Skin: Warm and dry, no rash or jaundice.   Psych: Alert and cooperative, normal mood and affect.  Labs: Lab Results  Component Value Date   CREATININE 0.68 11/21/2015   BUN 11 11/21/2015   NA 141 11/21/2015   K 4.3 11/21/2015   CL 104 11/21/2015   CO2 26 11/21/2015   Lab Results  Component Value Date   ALT 22 11/21/2015   AST 30 11/21/2015   ALKPHOS 68 11/21/2015   BILITOT 0.5 11/21/2015   Lab Results  Component Value Date   WBC 7.8 11/21/2015   HGB 13.7 11/21/2015   HCT 41.0 11/21/2015   MCV 91.9 11/21/2015   PLT 285 11/21/2015   Lab Results  Component Value Date   INR 1.04 11/21/2015   INR 1.0 12/02/2008   INR 1.0 11/18/2007     Imaging Studies: No results found.

## 2015-12-01 NOTE — Patient Instructions (Signed)
1. Upper endoscopy and colonoscopy with Dr. Jena Gauss. Please see separate instructions. 2. We will reschedule your liver biopsy as discussed. I will speak with interventional radiology regarding her needs for sedation.

## 2015-12-05 NOTE — Assessment & Plan Note (Signed)
History of 3 diminutive colon polyps in 2006, unknown path. Recent vague lower abdominal pain, weight loss. Plan on colonoscopy with Dr. Jena Gauss with deep sedation due to polypharmacy.  I have discussed the risks, alternatives, benefits with regards to but not limited to the risk of reaction to medication, bleeding, infection, perforation and the patient is agreeable to proceed. Written consent to be obtained.

## 2015-12-05 NOTE — Progress Notes (Signed)
We need to reschedule her transjugular liver biopsy with hepatic wedge gradient pressure measurements for hepatomegaly.   SEE BELOW: WE NEED TO PUT ON OUR ORDER REQUEST THAT PATIENT RECEIVES IV VERSED IN RN HOLDING BAY AREA TO ASSIST WITH PANIC ATTACKS AND SEVERE CLAUSTROPHOBIA.   ALSO PLEASE VERBALLY SPEAK WITH IR ABOUT THIS ISSUES TO MAKE SURE THEY ARE AWARE.   Amy Lauris Chroman, RN at 11/21/2015 11:36 AM     Status: Signed       Expand All Collapse All   Pt was here for Transjugular bx. Pt got on IR table and had panic attack when being covered with drape. Dr Lowella Dandy spoke with her-she sat up on IR table and was adamant that she couldn't do procedure. Stated she needs to be given some meds prior to coming into IR suite like they do when she has colonoscopy. MD stated we could give her IV Versed next time in RN holding bay area to assist with panic attacks and severe chlostrophobia. Pt became very tachycardic and upset when drape was placed. Pt was D/C home with sister.

## 2015-12-05 NOTE — Assessment & Plan Note (Signed)
Solid food and pill dysphagia in the setting of refractory GERD. Last dilation in 2009. Continue current PPI regimen. Plan on EGD with dilation in the near future with deep sedation in the OR due to polypharmacy.  I have discussed the risks, alternatives, benefits with regards to but not limited to the risk of reaction to medication, bleeding, infection, perforation and the patient is agreeable to proceed. Written consent to be obtained.

## 2015-12-05 NOTE — Assessment & Plan Note (Addendum)
Hepatomegaly on CT. F2/F3 Metavir score. HCV ab negative. Attempted transjugular liver biopsy with hepatic wedge gradient pressure measurements. She was scheduled however developed panic attacks prior to sedation. Was not able to undergo procedure. She states that she will require some sort of sedation prior to going into the prep room. She states draping her body prior to sedation set off her anxiety which was uncontrollable. She is interested in pursuing liver biopsy if she can be sedated as described. We will contact interventional radiology to see if they can assist with this.

## 2015-12-06 NOTE — Progress Notes (Signed)
CC'D TO PCP °

## 2015-12-07 NOTE — Progress Notes (Signed)
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

## 2015-12-11 NOTE — Progress Notes (Signed)
Pt is set up for April 12 @ 9:00

## 2015-12-14 ENCOUNTER — Ambulatory Visit (INDEPENDENT_AMBULATORY_CARE_PROVIDER_SITE_OTHER): Payer: BLUE CROSS/BLUE SHIELD | Admitting: Otolaryngology

## 2015-12-18 ENCOUNTER — Other Ambulatory Visit: Payer: Self-pay | Admitting: Radiology

## 2015-12-19 ENCOUNTER — Other Ambulatory Visit (HOSPITAL_COMMUNITY): Payer: BLUE CROSS/BLUE SHIELD

## 2015-12-19 ENCOUNTER — Other Ambulatory Visit: Payer: Self-pay | Admitting: Radiology

## 2015-12-20 ENCOUNTER — Other Ambulatory Visit: Payer: Self-pay | Admitting: Gastroenterology

## 2015-12-20 ENCOUNTER — Ambulatory Visit (HOSPITAL_COMMUNITY)
Admission: RE | Admit: 2015-12-20 | Discharge: 2015-12-20 | Disposition: A | Discharge status: Home / Self Care | Payer: BLUE CROSS/BLUE SHIELD | Source: Ambulatory Visit | Attending: Gastroenterology | Admitting: Gastroenterology

## 2015-12-20 ENCOUNTER — Encounter (HOSPITAL_COMMUNITY): Payer: Self-pay

## 2015-12-20 DIAGNOSIS — G8929 Other chronic pain: Secondary | ICD-10-CM | POA: Insufficient documentation

## 2015-12-20 DIAGNOSIS — Z8701 Personal history of pneumonia (recurrent): Secondary | ICD-10-CM | POA: Insufficient documentation

## 2015-12-20 DIAGNOSIS — Z7982 Long term (current) use of aspirin: Secondary | ICD-10-CM | POA: Diagnosis not present

## 2015-12-20 DIAGNOSIS — I341 Nonrheumatic mitral (valve) prolapse: Secondary | ICD-10-CM | POA: Insufficient documentation

## 2015-12-20 DIAGNOSIS — J449 Chronic obstructive pulmonary disease, unspecified: Secondary | ICD-10-CM | POA: Insufficient documentation

## 2015-12-20 DIAGNOSIS — Z79899 Other long term (current) drug therapy: Secondary | ICD-10-CM | POA: Diagnosis not present

## 2015-12-20 DIAGNOSIS — Z880 Allergy status to penicillin: Secondary | ICD-10-CM | POA: Diagnosis not present

## 2015-12-20 DIAGNOSIS — R160 Hepatomegaly, not elsewhere classified: Secondary | ICD-10-CM | POA: Diagnosis present

## 2015-12-20 DIAGNOSIS — F17210 Nicotine dependence, cigarettes, uncomplicated: Secondary | ICD-10-CM | POA: Diagnosis not present

## 2015-12-20 DIAGNOSIS — G35 Multiple sclerosis: Secondary | ICD-10-CM | POA: Diagnosis not present

## 2015-12-20 DIAGNOSIS — F419 Anxiety disorder, unspecified: Secondary | ICD-10-CM | POA: Diagnosis not present

## 2015-12-20 DIAGNOSIS — M1990 Unspecified osteoarthritis, unspecified site: Secondary | ICD-10-CM | POA: Diagnosis not present

## 2015-12-20 DIAGNOSIS — K219 Gastro-esophageal reflux disease without esophagitis: Secondary | ICD-10-CM | POA: Insufficient documentation

## 2015-12-20 DIAGNOSIS — E785 Hyperlipidemia, unspecified: Secondary | ICD-10-CM | POA: Insufficient documentation

## 2015-12-20 DIAGNOSIS — F329 Major depressive disorder, single episode, unspecified: Secondary | ICD-10-CM | POA: Diagnosis not present

## 2015-12-20 DIAGNOSIS — K760 Fatty (change of) liver, not elsewhere classified: Secondary | ICD-10-CM | POA: Insufficient documentation

## 2015-12-20 LAB — CBC
HEMATOCRIT: 48.3 % — AB (ref 36.0–46.0)
HEMOGLOBIN: 16.4 g/dL — AB (ref 12.0–15.0)
MCH: 31.4 pg (ref 26.0–34.0)
MCHC: 34 g/dL (ref 30.0–36.0)
MCV: 92.5 fL (ref 78.0–100.0)
Platelets: 285 10*3/uL (ref 150–400)
RBC: 5.22 MIL/uL — ABNORMAL HIGH (ref 3.87–5.11)
RDW: 13.3 % (ref 11.5–15.5)
WBC: 8.9 10*3/uL (ref 4.0–10.5)

## 2015-12-20 LAB — PROTIME-INR
INR: 1.12 (ref 0.00–1.49)
PROTHROMBIN TIME: 14.6 s (ref 11.6–15.2)

## 2015-12-20 LAB — APTT: APTT: 35 s (ref 24–37)

## 2015-12-20 MED ORDER — MIDAZOLAM HCL 2 MG/2ML IJ SOLN
INTRAMUSCULAR | Status: AC
  Filled 2015-12-20: qty 2

## 2015-12-20 MED ORDER — SODIUM CHLORIDE 0.9 % IV SOLN: Freq: Once | INTRAVENOUS | Status: DC

## 2015-12-20 MED ORDER — FENTANYL CITRATE (PF) 100 MCG/2ML IJ SOLN
INTRAMUSCULAR | Status: AC
  Filled 2015-12-20: qty 2

## 2015-12-20 MED ORDER — GELATIN ABSORBABLE 12-7 MM EX MISC
CUTANEOUS | Status: AC
  Filled 2015-12-20: qty 1

## 2015-12-20 MED ORDER — OXYCODONE-ACETAMINOPHEN 5-325 MG PO TABS
ORAL_TABLET | ORAL | Status: AC
  Filled 2015-12-20: qty 2

## 2015-12-20 MED ORDER — LIDOCAINE HCL (PF) 1 % IJ SOLN
INTRAMUSCULAR | Status: AC
  Filled 2015-12-20: qty 30

## 2015-12-20 MED ORDER — OXYCODONE-ACETAMINOPHEN 5-325 MG PO TABS
2.0000 | ORAL_TABLET | Freq: Once | ORAL | Status: AC
  Administered 2015-12-20: 2 via ORAL

## 2015-12-20 MED ORDER — FENTANYL CITRATE (PF) 100 MCG/2ML IJ SOLN
INTRAMUSCULAR | Status: AC | PRN
  Administered 2015-12-20 (×2): 25 ug via INTRAVENOUS
  Administered 2015-12-20: 50 ug via INTRAVENOUS

## 2015-12-20 MED ORDER — MIDAZOLAM HCL 2 MG/2ML IJ SOLN
INTRAMUSCULAR | Status: AC | PRN
  Administered 2015-12-20: 1 mg via INTRAVENOUS
  Administered 2015-12-20 (×2): 0.5 mg via INTRAVENOUS

## 2015-12-20 MED ORDER — OXYCODONE-ACETAMINOPHEN 7.5-325 MG PO TABS: 2.0000 | ORAL_TABLET | ORAL | Status: DC | PRN

## 2015-12-20 NOTE — Sedation Documentation (Signed)
Vital signs stable. 

## 2015-12-20 NOTE — Sedation Documentation (Signed)
Patient is resting comfortably. 

## 2015-12-20 NOTE — Discharge Instructions (Addendum)
Liver Biopsy, Care After These instructions give you information on caring for yourself after your procedure. Your doctor may also give you more specific instructions. Call your doctor if you have any problems or questions after your procedure. HOME CARE  Rest at home for 1-2 days or as told by your doctor.  Have someone stay with you for at least 24 hours.  Do not do these things in the first 24 hours:  Drive.  Use machinery.  Take care of other people.  Sign legal documents.  Take a bath or shower.  There are many different ways to close and cover a cut (incision). For example, a cut can be closed with stitches, skin glue, or adhesive strips. Follow your doctor's instructions on:  Taking care of your cut.  Changing and removing your bandage (dressing).  Removing whatever was used to close your cut.  Do not drink alcohol in the first week.  Do not lift more than 5 pounds or play contact sports for the first 2 weeks.  Take medicines only as told by your doctor. For 1 week, do not take medicine that has aspirin in it or medicines like ibuprofen.  Get your test results. GET HELP IF:  A cut bleeds and leaves more than just a small spot of blood.  A cut is red, puffs up (swells), or hurts more than before.  Fluid or something else comes from a cut.  A cut smells bad.  You have a fever or chills. GET HELP RIGHT AWAY IF:  You have swelling, bloating, or pain in your belly (abdomen).  You get dizzy or faint.  You have a rash.  You feel sick to your stomach (nauseous) or throw up (vomit).  You have trouble breathing, feel short of breath, or feel faint.  Your chest hurts.  You have problems talking or seeing.  You have trouble balancing or moving your arms or legs.   This information is not intended to replace advice given to you by your health care provider. Make sure you discuss any questions you have with your health care provider.   Document Released:  06/04/2008 Document Revised: 09/16/2014 Document Reviewed: 10/22/2013 Elsevier Interactive Patient Education 2016 [employer]. Site area: right brachial  Site Prior to Removal:  Level 0  Pressure Applied For 15  Minutes Beginning at 1040  Manual:   Yes   Patient Status During Pull:  good  Post Pull Groin Site:  Level 0  Post Pull Instructions Given:  Yes  Post Pull Pulses Present:  Yes right brachial and radial Dressing Applied:  Yes  Pressure held for 15 minutes Condition post pull good  Comments:  Pt tolerated pull well

## 2015-12-20 NOTE — H&P (Signed)
Chief Complaint: Patient was seen in consultation today for hepatomegaly at the request of Sams,Anna W  Referring Physician(s): Sams,Anna W Dr Cindie Laroche  Supervising Physician: Ruel Favors  History of Present Illness: Sharon Gay is a 50 y.o. female   Pt was hospitalized for PNA few months ago at [facility] Incidentally found hepatomegaly while in work up for abd pain/N/V in house [place] 10/05/15: IMPRESSION: 1. Hepatomegaly. 2. No focal liver lesions. 3. No ultrasound evidence for acute cholecystitis. 4. Median hepatic shear wave velocity is calculated at 1.31 m/sec. Corresponding Metavir fibrosis score is F2 and some F3. Risk of fibrosis is moderate.  Now scheduled for Transjugular liver core biopsy with wedge gradient pressure measurments  ** of note, pt had been scheduled for this same procedure 11/21/15 and procedure was cancelled secondary panic attack on table Today is re attempt   Past Medical History  Diagnosis Date  . Anxiety   . Migraines   . COPD (chronic obstructive pulmonary disease) (HCC)     11/2003: Normal PFTs ex minimal increased volumes and decreased the DLCO; Followed in Pulmonary clinic/ [facility]/ [place]   . Chest pain 2003    11/2007: Admission with unstable angina; normal coronary angiography  with [facility]; nl EF  . Mycobacterium avium complex (HCC) 2010    01/2009: + AFB stain on bronchial washings  . Gastroesophageal reflux disease 2009    Hiatal hernia; esophageal dilatation for Schatzki's ring in 04/2008; chronic constipation; irritable bowel syndrome  . Colitis 2005  . Hyperlipidemia 2009    Lipid profile 07/2012:227, 91, 56, 153  . Mitral valve prolapse   . MS (multiple sclerosis) (HCC)   . Mitral valve prolapse   . Lung disease 2010  . MS (multiple sclerosis) (HCC) 2001  . PONV (postoperative nausea and vomiting)   . Back pain   . Depression   . Chronic pain   . Arthritis   . AC (acromioclavicular)  joint bone spurs   . Smoker 10/31/2015  . History of pneumonia   . Postmenopausal 10/31/2015    Past Surgical History  Procedure Laterality Date  . Vocal cord polyp removed  2003  . Orif radial fracture  2012    Left  . Fiberoptic bronchoscopy  2010  . Colonoscopy w/ polypectomy  2006    Dr. Jena Gauss: 3 diminutive polyps in descending colon, unknown path   . Esophagogastroduodenoscopy  2009    Dr. Jena Gauss: normal esophagus, small hiatal hernia, s/p empiric dilatilon   . Esophagogastroduodenoscopy  2003    Schatzki's ring s/p dilation    Allergies: Penicillins; Morphine; Rifampin; Topamax; Cymbalta; and Tequin  Medications: Prior to Admission medications   Medication Sig Start Date End Date Taking? Authorizing Provider  ALPRAZolam Prudy Feeler) 1 MG tablet Take 1 mg by mouth 4 (four) times daily as needed for anxiety.   Yes Historical Provider, MD  aspirin EC 81 MG tablet Take 81 mg by mouth daily.   Yes Historical Provider, MD  Cholecalciferol (VITAMIN D3) 50000 units CAPS Take 1 capsule by mouth once a week.   Yes Historical Provider, MD  albuterol (PROVENTIL HFA;VENTOLIN HFA) 108 (90 BASE) MCG/ACT inhaler Inhale 1-2 puffs into the lungs every 6 (six) hours as needed for wheezing or shortness of breath. 08/25/15   Ivery Quale, PA-C  benzonatate (TESSALON) 100 MG capsule Take 100 mg by mouth 3 (three) times daily. 12/01/15   Historical Provider, MD  dexlansoprazole (DEXILANT) 60 MG capsule Take 60 mg  by mouth daily.    Historical Provider, MD  fluticasone (FLONASE) 50 MCG/ACT nasal spray Place 2 sprays into both nostrils 3 (three) times daily.    Historical Provider, MD  HM NICOTINE 21 MG/24HR patch Apply 21 mg topically. 11/01/15   Historical Provider, MD  lidocaine (XYLOCAINE) 5 % ointment Apply 1 application topically daily as needed. 11/28/15   Historical Provider, MD  meloxicam (MOBIC) 7.5 MG tablet Take 7.5 mg by mouth 2 (two) times daily as needed for pain.    Historical Provider, MD    oxycodone (ROXICODONE) 30 MG immediate release tablet Take 30-60 mg by mouth every 4 (four) hours as needed for pain.    Historical Provider, MD  oxyCODONE-acetaminophen (PERCOCET) 10-325 MG tablet 1 tablet every 4 (four) hours as needed.  10/20/15   Historical Provider, MD  PARoxetine (PAXIL) 20 MG tablet Take 20 mg by mouth daily.    Historical Provider, MD  promethazine (PHENERGAN) 25 MG tablet Take 25 mg by mouth every 6 (six) hours as needed for nausea or vomiting.     Historical Provider, MD  sodium chloride (OCEAN) 0.65 % SOLN nasal spray Place 1 spray into both nostrils as needed for congestion.    Historical Provider, MD  Vitamin D, Ergocalciferol, (DRISDOL) 50000 units CAPS capsule Take 1 capsule by mouth every 30 (thirty) days. 12/07/15   Historical Provider, MD     Family History  Problem Relation Age of Onset  . Emphysema Maternal Grandfather     was a smoker  . Arthritis Sister   . Mental retardation Sister   . Heart disease Sister     Also brother; cardiomyopathy  . Thyroid disease Sister   . Fibromyalgia Sister   . Arthritis Sister   . Migraines Sister   . COPD Sister   . Heart attack Father 39  . Colon cancer Neg Hx   . Arthritis Sister   . Migraines Sister   . Other Sister     brain tumor  . Arthritis Brother   . Arthritis Brother     Social History   Social History  . Marital Status: Married    Spouse Name: N/A  . Number of Children: 2  . Years of Education: N/A   Occupational History  . Automotive    Social History Main Topics  . Smoking status: Current Every Day Smoker -- 0.00 packs/day for 30 years    Types: Cigarettes  . Smokeless tobacco: None     Comment: smokes 4-5 cig daily  . Alcohol Use: No  . Drug Use: No  . Sexual Activity: Not Currently    Birth Control/ Protection: Post-menopausal   Other Topics Concern  . None   Social History Narrative   2 stepchildren     Review of Systems: A 12 point ROS discussed and pertinent positives  are indicated in the HPI above.  All other systems are negative.  Review of Systems  Constitutional: Negative for fever, activity change, appetite change and fatigue.  Respiratory: Negative for cough and shortness of breath.   Gastrointestinal: Positive for nausea, abdominal pain and abdominal distention.  Neurological: Negative for weakness.  Psychiatric/Behavioral: Negative for behavioral problems and confusion.    Vital Signs: BP 105/75 mmHg  Pulse 75  Temp(Src) 98.4 F (36.9 C) (Oral)  Resp 13  Ht 5\' 11"  (1.803 m)  Wt 132 lb (59.875 kg)  BMI 18.42 kg/m2  SpO2 100%  Physical Exam  Constitutional: She is oriented to person, place, and  time. She appears well-nourished.  Cardiovascular: Normal rate, regular rhythm and normal heart sounds.   Pulmonary/Chest: Effort normal and breath sounds normal. She has no wheezes.  Abdominal: Soft. Bowel sounds are normal. There is tenderness.  Musculoskeletal: Normal range of motion.  Neurological: She is alert and oriented to person, place, and time.  Skin: Skin is warm and dry.  Psychiatric: She has a normal mood and affect. Her behavior is normal. Judgment and thought content normal.  Nursing note and vitals reviewed.   Mallampati Score:  MD Evaluation Airway: WNL Heart: WNL Abdomen: WNL Chest/ Lungs: WNL ASA  Classification: 2 Mallampati/Airway Score: One  Imaging: No results found.  Labs:  CBC:  Recent Labs  08/28/15 0549 09/15/15 0939 11/21/15 0832 12/20/15 0735  WBC 9.5 8.3 7.8 8.9  HGB 11.9* 14.4 13.7 16.4*  HCT 34.5* 42.9 41.0 48.3*  PLT 262 337 285 285    COAGS:  Recent Labs  11/21/15 0832 12/20/15 0735  INR 1.04 1.12  APTT 33 35    BMP:  Recent Labs  08/25/15 0010 08/26/15 0445 09/15/15 0939 11/21/15 0832  NA 132* 140 138 141  K 3.3* 3.5 4.6 4.3  CL 104 115* 101 104  CO2 22 20* 28 26  GLUCOSE 112* 119* 93 96  BUN 15 10 14 11   CALCIUM 8.2* 7.6* 9.6 9.4  CREATININE 0.72 0.54 0.72 0.68    GFRNONAA >60 >60 >89 >60  GFRAA >60 >60 >89 >60    LIVER FUNCTION TESTS:  Recent Labs  08/25/15 0010 08/26/15 0445 09/15/15 0939 11/21/15 0832  BILITOT 0.7 0.2* 0.2 0.5  AST 22 51* 29 30  ALT 18 34 22 22  ALKPHOS 62 59 69 68  PROT 6.6 5.6* 6.8 6.4*  ALBUMIN 3.2* 2.5* 4.0 3.5    TUMOR MARKERS: No results for input(s): AFPTM, CEA, CA199, CHROMGRNA in the last 8760 hours.  Assessment and Plan:  Hepatomegaly Wave velocity 1.31 m/sec---F2/F3 level Now scheduled for Transjugular liver core biopsy with wedge gradient pressure measurments Risks and Benefits discussed with the patient including, but not limited to bleeding, infection, damage to adjacent structures or low yield requiring additional tests. All of the patient's questions were answered, patient is agreeable to proceed. Consent signed and in chart.   Thank you for this interesting consult.  I greatly enjoyed meeting Sharon Gay and look forward to participating in their care.  A copy of this report was sent to the requesting provider on this date.  Electronically Signed: Daisa Stennis A 12/20/2015, 8:44 AM   I spent a total of  30 Minutes   in face to face in clinical consultation, greater than 50% of which was counseling/coordinating care for TJ Liver bx

## 2015-12-20 NOTE — Procedures (Signed)
Successful US random liver core bx No comp Stable Path pending Full report in PACS  

## 2015-12-20 NOTE — Sedation Documentation (Signed)
Pt states she is very anxious. Pt is very talkative.

## 2015-12-20 NOTE — Patient Instructions (Signed)
Sharon Gay  12/20/2015     @   Your procedure is scheduled on 12/25/2015.  Report to [name] at 10:45 A.M.  Call this number if you have problems the morning of surgery:  [phone]   Remember:  Do not eat food or drink liquids after midnight.  Take these medicines the morning of surgery with A SIP OF WATER Albuterol inhaler (bring to hospital with you), Xanax, Dexilant, Flonase, Oxycodone if needed  Phenergan if needed   Do not wear jewelry, make-up or nail polish.  Do not wear lotions, powders, or perfumes.  You may wear deodorant.  Do not shave 48 hours prior to surgery.  Men may shave face and neck.  Do not bring valuables to the hospital.  [facility] is not responsible for any belongings or valuables.  Contacts, dentures or bridgework may not be worn into surgery.  Leave your suitcase in the car.  After surgery it may be brought to your room.  For patients admitted to the hospital, discharge time will be determined by your treatment team.  Patients discharged the day of surgery will not be allowed to drive home.    Please read over the following fact sheets that you were given. Anesthesia Post-op Instructions     PATIENT INSTRUCTIONS POST-ANESTHESIA  IMMEDIATELY FOLLOWING SURGERY:  Do not drive or operate machinery for the first twenty four hours after surgery.  Do not make any important decisions for twenty four hours after surgery or while taking narcotic pain medications or sedatives.  If you develop intractable nausea and vomiting or a severe headache please notify your doctor immediately.  FOLLOW-UP:  Please make an appointment with your surgeon as instructed. You do not need to follow up with anesthesia unless specifically instructed to do so.  WOUND CARE INSTRUCTIONS (if applicable):  Keep a dry clean dressing on the anesthesia/puncture wound site if there is drainage.  Once the wound has quit draining you may leave it open to air.   Generally you should leave the bandage intact for twenty four hours unless there is drainage.  If the epidural site drains for more than 36-48 hours please call the anesthesia department.  QUESTIONS?:  Please feel free to call your physician or the hospital operator if you have any questions, and they will be happy to assist you.      Esophagogastroduodenoscopy Esophagogastroduodenoscopy (EGD) is a procedure that is used to examine the lining of the esophagus, stomach, and first part of the small intestine (duodenum). A long, flexible, lighted tube with a camera attached (endoscope) is inserted down the throat to view these organs. This procedure is done to detect problems or abnormalities, such as inflammation, bleeding, ulcers, or growths, in order to treat them. The procedure lasts 5-20 minutes. It is usually an outpatient procedure, but it may need to be performed in a hospital in emergency cases. LET [facility] CARE PROVIDER KNOW ABOUT:  Any allergies you have.  All medicines you are taking, including vitamins, herbs, eye drops, creams, and over-the-counter medicines.  Previous problems you or members of your family have had with the use of anesthetics.  Any blood disorders you have.  Previous surgeries you have had.  Medical conditions you have. RISKS AND COMPLICATIONS Generally, this is a safe procedure. However, problems can occur and include:  Infection.  Bleeding.  Tearing (perforation) of the esophagus, stomach, or duodenum.  Difficulty breathing or not being able to breathe.  Excessive sweating.  Spasms  of the larynx.  Slowed heartbeat.  Low blood pressure. BEFORE THE PROCEDURE  Do not eat or drink anything after midnight on the night before the procedure or as directed by your health care provider.  Do not take your regular medicines before the procedure if your health care provider asks you not to. Ask your health care provider about changing or stopping those  medicines.  If you wear dentures, be prepared to remove them before the procedure.  Arrange for someone to drive you home after the procedure. PROCEDURE  A numbing medicine (local anesthetic) may be sprayed in your throat for comfort and to stop you from gagging or coughing.  You will have an IV tube inserted in a vein in your hand or arm. You will receive medicines and fluids through this tube.  You will be given a medicine to relax you (sedative).  A pain reliever will be given through the IV tube.  A mouth guard may be placed in your mouth to protect your teeth and to keep you from biting on the endoscope.  You will be asked to lie on your left side.  The endoscope will be inserted down your throat and into your esophagus, stomach, and duodenum.  Air will be put through the endoscope to allow your health care provider to clearly view the lining of your esophagus.  The lining of your esophagus, stomach, and duodenum will be examined. During the exam, your health care provider may:  Remove tissue to be examined under a microscope (biopsy) for inflammation, infection, or other medical problems.  Remove growths.  Remove objects (foreign bodies) that are stuck.  Treat any bleeding with medicines or other devices that stop tissues from bleeding (hot cautery, clipping devices).  Widen (dilate) or stretch narrowed areas of your esophagus and stomach.  The endoscope will be withdrawn. AFTER THE PROCEDURE  You will be taken to a recovery area for observation. Your blood pressure, heart rate, breathing rate, and blood oxygen level will be monitored often until the medicines you were given have worn off.  Do not eat or drink anything until the numbing medicine has worn off and your gag reflex has returned. You may choke.  Your health care provider should be able to discuss his or her findings with you. It will take longer to discuss the test results if any biopsies were taken.     This information is not intended to replace advice given to you by your health care provider. Make sure you discuss any questions you have with your health care provider.   Document Released: 12/27/2004 Document Revised: 09/16/2014 Document Reviewed: 07/29/2012 Elsevier Interactive Patient Education 2016 [employer]. Colonoscopy A colonoscopy is an exam to look at the entire large intestine (colon). This exam can help find problems such as tumors, polyps, inflammation, and areas of bleeding. The exam takes about 1 hour.  LET [facility] CARE PROVIDER KNOW ABOUT:   Any allergies you have.  All medicines you are taking, including vitamins, herbs, eye drops, creams, and over-the-counter medicines.  Previous problems you or members of your family have had with the use of anesthetics.  Any blood disorders you have.  Previous surgeries you have had.  Medical conditions you have. RISKS AND COMPLICATIONS  Generally, this is a safe procedure. However, as with any procedure, complications can occur. Possible complications include:  Bleeding.  Tearing or rupture of the colon wall.  Reaction to medicines given during the exam.  Infection (rare). BEFORE THE  PROCEDURE   Ask your health care provider about changing or stopping your regular medicines.  You may be prescribed an oral bowel prep. This involves drinking a large amount of medicated liquid, starting the day before your procedure. The liquid will cause you to have multiple loose stools until your stool is almost clear or light green. This cleans out your colon in preparation for the procedure.  Do not eat or drink anything else once you have started the bowel prep, unless your health care provider tells you it is safe to do so.  Arrange for someone to drive you home after the procedure. PROCEDURE   You will be given medicine to help you relax (sedative).  You will lie on your side with your knees bent.  A long, flexible tube  with a light and camera on the end (colonoscope) will be inserted through the rectum and into the colon. The camera sends video back to a computer screen as it moves through the colon. The colonoscope also releases carbon dioxide gas to inflate the colon. This helps your health care provider see the area better.  During the exam, your health care provider may take a small tissue sample (biopsy) to be examined under a microscope if any abnormalities are found.  The exam is finished when the entire colon has been viewed. AFTER THE PROCEDURE   Do not drive for 24 hours after the exam.  You may have a small amount of blood in your stool.  You may pass moderate amounts of gas and have mild abdominal cramping or bloating. This is caused by the gas used to inflate your colon during the exam.  Ask when your test results will be ready and how you will get your results. Make sure you get your test results.   This information is not intended to replace advice given to you by your health care provider. Make sure you discuss any questions you have with your health care provider.   Document Released: 08/23/2000 Document Revised: 06/16/2013 Document Reviewed: 05/03/2013 Elsevier Interactive Patient Education [employer]

## 2015-12-21 ENCOUNTER — Encounter (HOSPITAL_COMMUNITY)
Admission: RE | Admit: 2015-12-21 | Discharge: 2015-12-21 | Disposition: A | Discharge status: Home / Self Care | Payer: BLUE CROSS/BLUE SHIELD | Source: Ambulatory Visit | Attending: Internal Medicine | Admitting: Internal Medicine

## 2015-12-21 ENCOUNTER — Encounter (HOSPITAL_COMMUNITY): Payer: Self-pay

## 2015-12-21 DIAGNOSIS — Z01812 Encounter for preprocedural laboratory examination: Secondary | ICD-10-CM | POA: Diagnosis not present

## 2015-12-21 LAB — BASIC METABOLIC PANEL
Anion gap: 5 (ref 5–15)
BUN: 14 mg/dL (ref 6–20)
CALCIUM: 9.2 mg/dL (ref 8.9–10.3)
CHLORIDE: 104 mmol/L (ref 101–111)
CO2: 29 mmol/L (ref 22–32)
CREATININE: 0.77 mg/dL (ref 0.44–1.00)
GFR calc non Af Amer: >60 mL/min (ref >60)
Glucose, Bld: 107 mg/dL — ABNORMAL HIGH (ref 65–99)
Potassium: 4.2 mmol/L (ref 3.5–5.1)
Sodium: 138 mmol/L (ref 135–145)

## 2015-12-21 NOTE — Pre-Procedure Instructions (Signed)
Patient given information to sign up for my chart at home. 

## 2015-12-21 NOTE — Telephone Encounter (Signed)
Open in error

## 2015-12-25 ENCOUNTER — Telehealth: Payer: Self-pay

## 2015-12-25 ENCOUNTER — Encounter (HOSPITAL_COMMUNITY): Payer: Self-pay | Admitting: Anesthesiology

## 2015-12-25 ENCOUNTER — Encounter (HOSPITAL_COMMUNITY): Admission: RE | Payer: Self-pay | Source: Ambulatory Visit

## 2015-12-25 ENCOUNTER — Ambulatory Visit (HOSPITAL_COMMUNITY)
Admission: RE | Admit: 2015-12-25 | Payer: BLUE CROSS/BLUE SHIELD | Source: Ambulatory Visit | Admitting: Internal Medicine

## 2015-12-25 SURGERY — COLONOSCOPY WITH PROPOFOL
Anesthesia: Monitor Anesthesia Care

## 2015-12-25 MED ORDER — PROPOFOL 10 MG/ML IV BOLUS
INTRAVENOUS | Status: AC
  Filled 2015-12-25: qty 40

## 2015-12-25 NOTE — Telephone Encounter (Signed)
Noted about the liver BX

## 2015-12-25 NOTE — Telephone Encounter (Signed)
Called pt and LMOM.  

## 2015-12-25 NOTE — Anesthesia Preprocedure Evaluation (Deleted)
Anesthesia Evaluation  Patient identified by MRN, date of birth, ID band Patient awake    Reviewed: Allergy & Precautions, NPO status , Patient's Chart, lab work & pertinent test results  History of Anesthesia Complications (+) PONV and history of anesthetic complications  Airway        Dental   Pulmonary COPD, former smoker,           Cardiovascular + angina (neg cath 2009)      Neuro/Psych  Headaches, PSYCHIATRIC DISORDERS (panic attacks) Anxiety Depression    GI/Hepatic GERD  ,  Endo/Other    Renal/GU      Musculoskeletal   Abdominal   Peds  Hematology   Anesthesia Other Findings   Reproductive/Obstetrics                            Anesthesia Physical Anesthesia Plan Anesthesia Quick Evaluation

## 2015-12-25 NOTE — Telephone Encounter (Signed)
Kim from short stay called and states that pt did not show for her procedure today. This makes the 3rd time pt has cancelled.   We have also tried to schedule pt for a liver bx multiple times and she still hasn't had that done yet.  Please advise

## 2015-12-25 NOTE — OR Nursing (Signed)
Attempted to call patient  2 times . Left voice mail message for her to return call. No response.  Still did not show up for scheduled appointment.

## 2015-12-25 NOTE — Telephone Encounter (Signed)
Please note she DID complete the liver biopsy. Her husband is ill, and she has difficulty at times completing appointments. However, it is not appropriate to no-show the procedure today. Have we contacted the patient to see what is going on?

## 2015-12-26 NOTE — Telephone Encounter (Signed)
Reviewed and will await message from the patient to see why she no showed for her procedure

## 2016-01-02 NOTE — Progress Notes (Signed)
Noted. Agree.

## 2016-01-02 NOTE — Progress Notes (Signed)
Quick Note:  Minimal steatosis, really overall liver bx normal. Recommend consider Fibrosure testing with promethius for comparison F scores as elastography can be falsely elevated. Patient should schedule OV with she is available. I see she recently no showed her procedures (EGD/TCS with propofol)  FYI for Tobi Bastos. Liver bx unremarkable. ______

## 2016-01-02 NOTE — Telephone Encounter (Signed)
Liver bx done and addressed. Ronita Hipps.

## 2016-01-15 ENCOUNTER — Telehealth: Payer: Self-pay

## 2016-01-15 NOTE — Telephone Encounter (Signed)
Pt has OV with Gerrit Halls on 05/25.

## 2016-01-15 NOTE — Telephone Encounter (Signed)
She also wants to have it done same time as husband (states sister will look after them post-procedure).  I agree with propofol for both. Hx of non-compliance - both.  Importance of compliance moving forward needs to be driven home with both patients (esp prep)

## 2016-01-15 NOTE — Telephone Encounter (Signed)
Unfortunately, I think she will need an office visit. She has multiple medical issues.

## 2016-01-15 NOTE — Telephone Encounter (Signed)
Routing to the clinical pool.

## 2016-01-15 NOTE — Telephone Encounter (Signed)
Pt was here with husband for his appt on 01/12/2016. Pt expressed wanting to get rescheduled for her procedures. Please advise. Pt has been scheduled for procedures multiple times recently and is usually with Propofol. Pt will require an OV prior to scheduling.

## 2016-02-01 ENCOUNTER — Other Ambulatory Visit: Payer: Self-pay

## 2016-02-01 ENCOUNTER — Encounter: Payer: Self-pay | Admitting: Gastroenterology

## 2016-02-01 ENCOUNTER — Ambulatory Visit (INDEPENDENT_AMBULATORY_CARE_PROVIDER_SITE_OTHER): Payer: BLUE CROSS/BLUE SHIELD | Admitting: Gastroenterology

## 2016-02-01 VITALS — BP 110/74 | HR 64 | Temp 97.5°F | Ht 71.0 in | Wt 141.4 lb

## 2016-02-01 DIAGNOSIS — R1310 Dysphagia, unspecified: Secondary | ICD-10-CM

## 2016-02-01 DIAGNOSIS — R160 Hepatomegaly, not elsewhere classified: Secondary | ICD-10-CM | POA: Diagnosis not present

## 2016-02-01 DIAGNOSIS — Z86010 Personal history of colonic polyps: Secondary | ICD-10-CM

## 2016-02-01 DIAGNOSIS — R591 Generalized enlarged lymph nodes: Secondary | ICD-10-CM

## 2016-02-01 MED ORDER — SOD PICOSULFATE-MAG OX-CIT ACD 10-3.5-12 MG-GM-GM PO PACK: 1.0000 | PACK | ORAL | Status: DC

## 2016-02-01 NOTE — Patient Instructions (Signed)
I have referred you back to an ENT physician due to the swollen neck glands that you have had chronically.  We have scheduled you for a colonoscopy, upper endoscopy, and dilation with Dr. Jena Gauss.  I will ask him about any further work-up with the liver.

## 2016-02-01 NOTE — Progress Notes (Signed)
Referring Provider: Elfredia Nevins, MD Primary Care Physician:  Cassell Smiles., MD  Primary GI: Dr. Jena Gauss   Chief Complaint  Patient presents with  . Follow-up    set up procedure    HPI:   Sharon Gay is a 50 y.o. female presenting today with a history of 3 diminutive colon polyps back in 2006, unknown path. Weight loss (recently stabilized), vague abdominal pain, solid food and pill dysphagia. History of cervical lymphadenopathy, chronic. Noted to have hepatomegaly on CT. Ultrasound with elastography with Metavir F2/F3 score. Previous positive H. pylori serology status post treatment. IR biopsy of liver completed with minimal steatosis, overall liver biopsy normal.   Here to reschedule colonoscopy, EGD/ED, which has been cancelled on several occasions due to husband's medical appointments. He was recently diagnosed with melanoma. She has not seen ENT, which we had recommended due to cervical lymphadenopathy. She states she was never referred. Still with solid food and pill dysphagia. Concerned about her liver. Had difficulty tolerating prep recently. Asking for lower volume prep.   Past Medical History  Diagnosis Date  . Anxiety   . Migraines   . COPD (chronic obstructive pulmonary disease) (HCC)     11/2003: Normal PFTs ex minimal increased volumes and decreased the DLCO; Followed in Pulmonary clinic/ [facility]/ [place]   . Chest pain 2003    11/2007: Admission with unstable angina; normal coronary angiography  with [facility]; nl EF  . Mycobacterium avium complex (HCC) 2010    01/2009: + AFB stain on bronchial washings  . Gastroesophageal reflux disease 2009    Hiatal hernia; esophageal dilatation for Schatzki's ring in 04/2008; chronic constipation; irritable bowel syndrome  . Colitis 2005  . Hyperlipidemia 2009    Lipid profile 07/2012:227, 91, 56, 153  . Mitral valve prolapse   . MS (multiple sclerosis) (HCC)   . Mitral valve prolapse   .  Lung disease 2010  . MS (multiple sclerosis) (HCC) 2001  . PONV (postoperative nausea and vomiting)   . Back pain   . Depression   . Chronic pain   . Arthritis   . AC (acromioclavicular) joint bone spurs   . Smoker 10/31/2015  . History of pneumonia   . Postmenopausal 10/31/2015    Past Surgical History  Procedure Laterality Date  . Vocal cord polyp removed  2003  . Orif radial fracture  2012    Left  . Fiberoptic bronchoscopy  2010  . Colonoscopy w/ polypectomy  2006    Dr. Jena Gauss: 3 diminutive polyps in descending colon, unknown path   . Esophagogastroduodenoscopy  2009    Dr. Jena Gauss: normal esophagus, small hiatal hernia, s/p empiric dilatilon   . Esophagogastroduodenoscopy  2003    Schatzki's ring s/p dilation  . Liver biopsy      Current Outpatient Prescriptions  Medication Sig Dispense Refill  . albuterol (PROVENTIL HFA;VENTOLIN HFA) 108 (90 BASE) MCG/ACT inhaler Inhale 1-2 puffs into the lungs every 6 (six) hours as needed for wheezing or shortness of breath. 1 Inhaler 0  . ALPRAZolam (XANAX) 1 MG tablet Take 1 mg by mouth 4 (four) times daily as needed for anxiety.    Marland Kitchen aspirin EC 81 MG tablet Take 81 mg by mouth daily.    . benzonatate (TESSALON) 100 MG capsule Take 100 mg by mouth 3 (three) times daily.  3  . Cholecalciferol (VITAMIN D3) 50000 units CAPS Take 1 capsule by mouth once a week.    Marland Kitchen dexlansoprazole (  DEXILANT) 60 MG capsule Take 60 mg by mouth daily.    . fluticasone (FLONASE) 50 MCG/ACT nasal spray Place 2 sprays into both nostrils 3 (three) times daily.    Marland Kitchen HM NICOTINE 21 MG/24HR patch Apply 21 mg topically.    . lidocaine (XYLOCAINE) 5 % ointment Apply 1 application topically daily as needed.    . meloxicam (MOBIC) 7.5 MG tablet Take 7.5 mg by mouth 2 (two) times daily as needed for pain.    Marland Kitchen oxycodone (ROXICODONE) 30 MG immediate release tablet Take 30-60 mg by mouth every 4 (four) hours as needed for pain.    Marland Kitchen oxyCODONE-acetaminophen (PERCOCET)  10-325 MG tablet 1 tablet every 4 (four) hours as needed.     Marland Kitchen PARoxetine (PAXIL) 20 MG tablet Take 20 mg by mouth daily.    . promethazine (PHENERGAN) 25 MG tablet Take 25 mg by mouth every 6 (six) hours as needed for nausea or vomiting.     . sodium chloride (OCEAN) 0.65 % SOLN nasal spray Place 1 spray into both nostrils as needed for congestion.    . Vitamin D, Ergocalciferol, (DRISDOL) 50000 units CAPS capsule Take 1 capsule by mouth every 30 (thirty) days.     No current facility-administered medications for this visit.    Allergies as of 02/01/2016 - Review Complete 02/01/2016  Allergen Reaction Noted  . Penicillins Anaphylaxis, Nausea And Vomiting, and Swelling 08/10/2012  . Morphine Other (See Comments)   . Rifampin Nausea Only and Other (See Comments) 02/06/2010  . Topamax [topiramate] Other (See Comments) 10/26/2015  . Cymbalta [duloxetine hcl] Other (See Comments) 10/26/2015  . Tequin [gatifloxacin] Nausea And Vomiting 08/10/2012    Family History  Problem Relation Age of Onset  . Emphysema Maternal Grandfather     was a smoker  . Arthritis Sister   . Mental retardation Sister   . Heart disease Sister     Also brother; cardiomyopathy  . Thyroid disease Sister   . Fibromyalgia Sister   . Arthritis Sister   . Migraines Sister   . COPD Sister   . Heart attack Father 6  . Colon cancer Neg Hx   . Arthritis Sister   . Migraines Sister   . Other Sister     brain tumor  . Arthritis Brother   . Arthritis Brother     Social History   Social History  . Marital Status: Married    Spouse Name: N/A  . Number of Children: 2  . Years of Education: N/A   Occupational History  . [employer]    Social History Main Topics  . Smoking status: Former Smoker -- 0.25 packs/day for 30 years    Types: Cigarettes    Quit date: 12/14/2015  . Smokeless tobacco: None     Comment: smokes 4-5 cig daily  . Alcohol Use: No  . Drug Use: No  . Sexual Activity: Not Currently     Birth Control/ Protection: Post-menopausal   Other Topics Concern  . None   Social History Narrative   2 stepchildren    Review of Systems: As mentioned in HPI.   Physical Exam: BP 110/74 mmHg  Pulse 64  Temp(Src) 97.5 F (36.4 C) (Oral)  Ht  (1.803 m)  Wt 141 lb 6.4 oz (64.139 kg)  BMI 19.73 kg/m2 General:   Alert and oriented. No distress noted. Pleasant and cooperative.  Head:  Normocephalic and atraumatic. Eyes:  Conjuctiva clear without scleral icterus. Mouth:  Oral mucosa pink and  moist. Good dentition. No lesions. Heart:  S1, S2 present without murmurs, rubs, or gallops. Regular rate and rhythm. Abdomen:  +BS, soft, TTP LUQ and non-distended. No rebound or guarding. No HSM or masses noted. Msk:  Symmetrical without gross deformities. Normal posture. Extremities:  Without edema. Neurologic:  Alert and  oriented x4;  grossly normal neurologically. Psych:  Alert and cooperative. Normal mood and affect.  Lab Results  Component Value Date   ALT 22 11/21/2015   AST 30 11/21/2015   ALKPHOS 68 11/21/2015   BILITOT 0.5 11/21/2015   Lab Results  Component Value Date   CREATININE 0.77 12/21/2015   BUN 14 12/21/2015   NA 138 12/21/2015   K 4.2 12/21/2015   CL 104 12/21/2015   CO2 29 12/21/2015   Lab Results  Component Value Date   WBC 8.9 12/20/2015   HGB 16.4* 12/20/2015   HCT 48.3* 12/20/2015   MCV 92.5 12/20/2015   PLT 285 12/20/2015

## 2016-02-02 ENCOUNTER — Other Ambulatory Visit: Payer: Self-pay

## 2016-02-02 DIAGNOSIS — R591 Generalized enlarged lymph nodes: Secondary | ICD-10-CM

## 2016-02-06 NOTE — Assessment & Plan Note (Signed)
Solid food and pill dysphagia in setting of refractory GERD. Last dilation in 2009. Continue Dexilant.   Proceed with upper endoscopy and dilation in the near future with Dr. Jena Gauss. The risks, benefits, and alternatives have been discussed in detail with patient. They have stated understanding and desire to proceed.  PROPOFOL due to polypharmacy

## 2016-02-06 NOTE — Assessment & Plan Note (Signed)
Chronic per patient. Personally, I have appreciated cervical lymphadenopathy on prior visits but ultrasound neck and CT likely reactive lymphadenopathy. Briefly discussed with Jenita Seashore, PA with Hematology due to chronic reports of lymphadenopathy, subjective fevers, night sweats. Recommend seeing ENT first for thorough exam as initial starting point. I have referred again to ENT, as this was already done several months ago. Patient states she never received an appt. It is likely that it was cancelled/rescheduled, as she has had to postpone evaluation thus far due to her husband's diagnosis of melanoma.

## 2016-02-06 NOTE — Assessment & Plan Note (Signed)
50 year old female with history of 3 diminutive colon polyps in 2006 with unknown path. Recent vague lower abdominal pain, weight loss. Multiple attempts at scheduling colonoscopy have been cancelled/postponed due to personal reasons and husband's illness (melanoma).   Proceed with TCS with Dr. Jena Gauss in near future: the risks, benefits, and alternatives have been discussed with the patient in detail. The patient states understanding and desires to proceed. PROPOFOL due to polypharmacy

## 2016-02-06 NOTE — Assessment & Plan Note (Signed)
Noted on CT. F2/F3 Metavir score, hep C antibody negative. Percutaneous liver biopsy completed (unable to complete transjugular due to significant anxiety). Biopsy with minimal steatosis, overall liver biopsy normal. Unclear etiology. I have discussed with patient pursuing TCS/EGD as previously planned, with further work-up of hepatomegaly if needed after these procedures.

## 2016-02-07 NOTE — Progress Notes (Signed)
cc'ed to pcp °

## 2016-02-12 ENCOUNTER — Telehealth: Payer: Self-pay

## 2016-02-12 NOTE — Telephone Encounter (Signed)
Patient has had numerous cancellations for colonoscopy/EGD since Dec 2016. At least once she has no-showed for a procedure. I know her husband is not well; however, this has become a pattern for her.   Routing to Dr. Jena Gauss: may need to have discussion with patient that if any further cancellation/no-show, would result in discharge from practice Nira Retort, ANP-BC [facility]

## 2016-02-12 NOTE — Patient Instructions (Signed)
Sharon Gay  02/12/2016     @   Your procedure is scheduled on  02/19/2016   Report to [facility] at  645  A.M.  Call this number if you have problems the morning of surgery:  [phone]   Remember:  Do not eat food or drink liquids after midnight.  Take these medicines the morning of surgery with A SIP OF WATER xanax, dexilant, mobic, roxicodone or oxycodone, paxil, phenergan. Take your inhaler before you come.   Do not wear jewelry, make-up or nail polish.  Do not wear lotions, powders, or perfumes.  You may wear deodorant.  Do not shave 48 hours prior to surgery.  Men may shave face and neck.  Do not bring valuables to the hospital.  [facility] is not responsible for any belongings or valuables.  Contacts, dentures or bridgework may not be worn into surgery.  Leave your suitcase in the car.  After surgery it may be brought to your room.  For patients admitted to the hospital, discharge time will be determined by your treatment team.  Patients discharged the day of surgery will not be allowed to drive home.   Name and phone number of your driver:   family Special instructions:  Follow the diet and preop instructions given to you by Dr Luvenia Starch office.  Please read over the following fact sheets that you were given. Pain Booklet, Surgical Site Infection Prevention, Anesthesia Post-op Instructions and Care and Recovery After Surgery      Esophagogastroduodenoscopy Esophagogastroduodenoscopy (EGD) is a procedure that is used to examine the lining of the esophagus, stomach, and first part of the small intestine (duodenum). A long, flexible, lighted tube with a camera attached (endoscope) is inserted down the throat to view these organs. This procedure is done to detect problems or abnormalities, such as inflammation, bleeding, ulcers, or growths, in order to treat them. The procedure lasts 5-20 minutes. It is usually an outpatient  procedure, but it may need to be performed in a hospital in emergency cases. LET [facility] CARE PROVIDER KNOW ABOUT:  Any allergies you have.  All medicines you are taking, including vitamins, herbs, eye drops, creams, and over-the-counter medicines.  Previous problems you or members of your family have had with the use of anesthetics.  Any blood disorders you have.  Previous surgeries you have had.  Medical conditions you have. RISKS AND COMPLICATIONS Generally, this is a safe procedure. However, problems can occur and include:  Infection.  Bleeding.  Tearing (perforation) of the esophagus, stomach, or duodenum.  Difficulty breathing or not being able to breathe.  Excessive sweating.  Spasms of the larynx.  Slowed heartbeat.  Low blood pressure. BEFORE THE PROCEDURE  Do not eat or drink anything after midnight on the night before the procedure or as directed by your health care provider.  Do not take your regular medicines before the procedure if your health care provider asks you not to. Ask your health care provider about changing or stopping those medicines.  If you wear dentures, be prepared to remove them before the procedure.  Arrange for someone to drive you home after the procedure. PROCEDURE  A numbing medicine (local anesthetic) may be sprayed in your throat for comfort and to stop you from gagging or coughing.  You will have an IV tube inserted in a vein in your hand or arm. You will receive medicines and  fluids through this tube.  You will be given a medicine to relax you (sedative).  A pain reliever will be given through the IV tube.  A mouth guard may be placed in your mouth to protect your teeth and to keep you from biting on the endoscope.  You will be asked to lie on your left side.  The endoscope will be inserted down your throat and into your esophagus, stomach, and duodenum.  Air will be put through the endoscope to allow your health  care provider to clearly view the lining of your esophagus.  The lining of your esophagus, stomach, and duodenum will be examined. During the exam, your health care provider may:  Remove tissue to be examined under a microscope (biopsy) for inflammation, infection, or other medical problems.  Remove growths.  Remove objects (foreign bodies) that are stuck.  Treat any bleeding with medicines or other devices that stop tissues from bleeding (hot cautery, clipping devices).  Widen (dilate) or stretch narrowed areas of your esophagus and stomach.  The endoscope will be withdrawn. AFTER THE PROCEDURE  You will be taken to a recovery area for observation. Your blood pressure, heart rate, breathing rate, and blood oxygen level will be monitored often until the medicines you were given have worn off.  Do not eat or drink anything until the numbing medicine has worn off and your gag reflex has returned. You may choke.  Your health care provider should be able to discuss his or her findings with you. It will take longer to discuss the test results if any biopsies were taken.   This information is not intended to replace advice given to you by your health care provider. Make sure you discuss any questions you have with your health care provider.   Document Released: 12/27/2004 Document Revised: 09/16/2014 Document Reviewed: 07/29/2012 Elsevier Interactive Patient Education 2016 [employer] Esophagogastroduodenoscopy, Care After Refer to this sheet in the next few weeks. These instructions provide you with information about caring for yourself after your procedure. Your health care provider may also give you more specific instructions. Your treatment has been planned according to current medical practices, but problems sometimes occur. Call your health care provider if you have any problems or questions after your procedure. WHAT TO EXPECT AFTER THE PROCEDURE After your procedure, it is typical  to feel:  Soreness in your throat.  Pain with swallowing.  Sick to your stomach (nauseous).  Bloated.  Dizzy.  Fatigued. HOME CARE INSTRUCTIONS  Do not eat or drink anything until the numbing medicine (local anesthetic) has worn off and your gag reflex has returned. You will know that the local anesthetic has worn off when you can swallow comfortably.  Do not drive or operate machinery until directed by your health care provider.  Take medicines only as directed by your health care provider. SEEK MEDICAL CARE IF:   You cannot stop coughing.  You are not urinating at all or less than usual. SEEK IMMEDIATE MEDICAL CARE IF:  You have difficulty swallowing.  You cannot eat or drink.  You have worsening throat or chest pain.  You have dizziness or lightheadedness or you faint.  You have nausea or vomiting.  You have chills.  You have a fever.  You have severe abdominal pain.  You have black, tarry, or bloody stools.   This information is not intended to replace advice given to you by your health care provider. Make sure you discuss any questions you have with your  health care provider.   Document Released: 08/12/2012 Document Revised: 09/16/2014 Document Reviewed: 08/12/2012 Elsevier Interactive Patient Education 2016 [employer] Esophageal Dilatation Esophageal dilatation is a procedure to open a blocked or narrowed part of the esophagus. The esophagus is the long tube in your throat that carries food and liquid from your mouth to your stomach. The procedure is also called esophageal dilation.  You may need this procedure if you have a buildup of scar tissue in your esophagus that makes it difficult, painful, or even impossible to swallow. This can be caused by gastroesophageal reflux disease (GERD). In rare cases, people need this procedure because they have cancer of the esophagus or a problem with the way food moves through the esophagus. Sometimes you may need to  have another dilatation to enlarge the opening of the esophagus gradually. LET [facility] CARE PROVIDER KNOW ABOUT:   Any allergies you have.  All medicines you are taking, including vitamins, herbs, eye drops, creams, and over-the-counter medicines.  Previous problems you or members of your family have had with the use of anesthetics.  Any blood disorders you have.  Previous surgeries you have had.  Medical conditions you have.  Any antibiotic medicines you are required to take before dental procedures. RISKS AND COMPLICATIONS Generally, this is a safe procedure. However, problems can occur and include:  Bleeding from a tear in the lining of the esophagus.  A hole (perforation) in the esophagus. BEFORE THE PROCEDURE  Do not eat or drink anything after midnight on the night before the procedure or as directed by your health care provider.  Ask your health care provider about changing or stopping your regular medicines. This is especially important if you are taking diabetes medicines or blood thinners.  Plan to have someone take you home after the procedure. PROCEDURE   You will be given a medicine that makes you relaxed and sleepy (sedative).  A medicine may be sprayed or gargled to numb the back of the throat.  Your health care provider can use various instruments to do an esophageal dilatation. During the procedure, the instrument used will be placed in your mouth and passed down into your esophagus. Options include:  Simple dilators. This instrument is carefully placed in the esophagus to stretch it.  Guided wire bougies. In this method, a flexible tube (endoscope) is used to insert a wire into the esophagus. The dilator is passed over this wire to enlarge the esophagus. Then the wire is removed.  Balloon dilators. An endoscope with a small balloon at the end is passed down into the esophagus. Inflating the balloon gently stretches the esophagus and opens it up. AFTER  THE PROCEDURE  Your blood pressure, heart rate, breathing rate, and blood oxygen level will be monitored often until the medicines you were given have worn off.  Your throat may feel slightly sore and will probably still feel numb. This will improve slowly over time.  You will not be allowed to eat or drink until the throat numbness has resolved.  If this is a same-day procedure, you may be allowed to go home once you have been able to drink, urinate, and sit on the edge of the bed without nausea or dizziness.  If this is a same-day procedure, you should have a friend or family member with you for the next 24 hours after the procedure.   This information is not intended to replace advice given to you by your health care provider. Make sure you  discuss any questions you have with your health care provider.   Document Released: 10/17/2005 Document Revised: 09/16/2014 Document Reviewed: 01/05/2014 Elsevier Interactive Patient Education 2016 [employer]. Colonoscopy A colonoscopy is an exam to look at the entire large intestine (colon). This exam can help find problems such as tumors, polyps, inflammation, and areas of bleeding. The exam takes about 1 hour.  LET [facility] CARE PROVIDER KNOW ABOUT:   Any allergies you have.  All medicines you are taking, including vitamins, herbs, eye drops, creams, and over-the-counter medicines.  Previous problems you or members of your family have had with the use of anesthetics.  Any blood disorders you have.  Previous surgeries you have had.  Medical conditions you have. RISKS AND COMPLICATIONS  Generally, this is a safe procedure. However, as with any procedure, complications can occur. Possible complications include:  Bleeding.  Tearing or rupture of the colon wall.  Reaction to medicines given during the exam.  Infection (rare). BEFORE THE PROCEDURE   Ask your health care provider about changing or stopping your regular  medicines.  You may be prescribed an oral bowel prep. This involves drinking a large amount of medicated liquid, starting the day before your procedure. The liquid will cause you to have multiple loose stools until your stool is almost clear or light green. This cleans out your colon in preparation for the procedure.  Do not eat or drink anything else once you have started the bowel prep, unless your health care provider tells you it is safe to do so.  Arrange for someone to drive you home after the procedure. PROCEDURE   You will be given medicine to help you relax (sedative).  You will lie on your side with your knees bent.  A long, flexible tube with a light and camera on the end (colonoscope) will be inserted through the rectum and into the colon. The camera sends video back to a computer screen as it moves through the colon. The colonoscope also releases carbon dioxide gas to inflate the colon. This helps your health care provider see the area better.  During the exam, your health care provider may take a small tissue sample (biopsy) to be examined under a microscope if any abnormalities are found.  The exam is finished when the entire colon has been viewed. AFTER THE PROCEDURE   Do not drive for 24 hours after the exam.  You may have a small amount of blood in your stool.  You may pass moderate amounts of gas and have mild abdominal cramping or bloating. This is caused by the gas used to inflate your colon during the exam.  Ask when your test results will be ready and how you will get your results. Make sure you get your test results.   This information is not intended to replace advice given to you by your health care provider. Make sure you discuss any questions you have with your health care provider.   Document Released: 08/23/2000 Document Revised: 06/16/2013 Document Reviewed: 05/03/2013 Elsevier Interactive Patient Education 2016 [employer]. Colonoscopy, Care  After Refer to this sheet in the next few weeks. These instructions provide you with information on caring for yourself after your procedure. Your health care provider may also give you more specific instructions. Your treatment has been planned according to current medical practices, but problems sometimes occur. Call your health care provider if you have any problems or questions after your procedure. WHAT TO EXPECT AFTER THE PROCEDURE  After  your procedure, it is typical to have the following:  A small amount of blood in your stool.  Moderate amounts of gas and mild abdominal cramping or bloating. HOME CARE INSTRUCTIONS  Do not drive, operate machinery, or sign important documents for 24 hours.  You may shower and resume your regular physical activities, but move at a slower pace for the first 24 hours.  Take frequent rest periods for the first 24 hours.  Walk around or put a warm pack on your abdomen to help reduce abdominal cramping and bloating.  Drink enough fluids to keep your urine clear or pale yellow.  You may resume your normal diet as instructed by your health care provider. Avoid heavy or fried foods that are hard to digest.  Avoid drinking alcohol for 24 hours or as instructed by your health care provider.  Only take over-the-counter or prescription medicines as directed by your health care provider.  If a tissue sample (biopsy) was taken during your procedure:  Do not take aspirin or blood thinners for 7 days, or as instructed by your health care provider.  Do not drink alcohol for 7 days, or as instructed by your health care provider.  Eat soft foods for the first 24 hours. SEEK MEDICAL CARE IF: You have persistent spotting of blood in your stool 2-3 days after the procedure. SEEK IMMEDIATE MEDICAL CARE IF:  You have more than a small spotting of blood in your stool.  You pass large blood clots in your stool.  Your abdomen is swollen (distended).  You have  nausea or vomiting.  You have a fever.  You have increasing abdominal pain that is not relieved with medicine.   This information is not intended to replace advice given to you by your health care provider. Make sure you discuss any questions you have with your health care provider.   Document Released: 04/09/2004 Document Revised: 06/16/2013 Document Reviewed: 05/03/2013 Elsevier Interactive Patient Education 2016 [employer] Inc. PATIENT INSTRUCTIONS POST-ANESTHESIA  IMMEDIATELY FOLLOWING SURGERY:  Do not drive or operate machinery for the first twenty four hours after surgery.  Do not make any important decisions for twenty four hours after surgery or while taking narcotic pain medications or sedatives.  If you develop intractable nausea and vomiting or a severe headache please notify your doctor immediately.  FOLLOW-UP:  Please make an appointment with your surgeon as instructed. You do not need to follow up with anesthesia unless specifically instructed to do so.  WOUND CARE INSTRUCTIONS (if applicable):  Keep a dry clean dressing on the anesthesia/puncture wound site if there is drainage.  Once the wound has quit draining you may leave it open to air.  Generally you should leave the bandage intact for twenty four hours unless there is drainage.  If the epidural site drains for more than 36-48 hours please call the anesthesia department.  QUESTIONS?:  Please feel free to call your physician or the hospital operator if you have any questions, and they will be happy to assist you.

## 2016-02-12 NOTE — Telephone Encounter (Signed)
Pt called to cancel her TCS because she has lost her insurance from being out of work. She is having to fill out paper work.

## 2016-02-13 ENCOUNTER — Encounter (HOSPITAL_COMMUNITY)
Admission: RE | Admit: 2016-02-13 | Discharge: 2016-02-13 | Disposition: A | Discharge status: Home / Self Care | Payer: BLUE CROSS/BLUE SHIELD | Source: Ambulatory Visit | Attending: Internal Medicine | Admitting: Internal Medicine

## 2016-02-13 ENCOUNTER — Encounter: Payer: Self-pay | Admitting: Internal Medicine

## 2016-02-13 NOTE — Telephone Encounter (Signed)
MADE PATIENT AN APPT AND MAILED LETTER

## 2016-02-13 NOTE — Telephone Encounter (Signed)
I have canceled her TCS and Eber Jones is aware

## 2016-02-13 NOTE — Telephone Encounter (Signed)
Dr.  -  patient relationship seems not to be productive any longer.   Will discuss further with staff.

## 2016-02-13 NOTE — Telephone Encounter (Signed)
I was asked by RMR to speak with AS and per AS the patient needs an ov with her in 4-6 weeks.  Also, please make sure we cancel her procedures.(Ginger)  Routing to [place] to schedule

## 2016-02-19 ENCOUNTER — Encounter (HOSPITAL_COMMUNITY): Admission: RE | Payer: Self-pay | Source: Ambulatory Visit

## 2016-02-19 ENCOUNTER — Ambulatory Visit (HOSPITAL_COMMUNITY)
Admission: RE | Admit: 2016-02-19 | Payer: BLUE CROSS/BLUE SHIELD | Source: Ambulatory Visit | Admitting: Internal Medicine

## 2016-02-19 SURGERY — COLONOSCOPY WITH PROPOFOL
Anesthesia: Monitor Anesthesia Care

## 2016-03-20 ENCOUNTER — Ambulatory Visit: Payer: BLUE CROSS/BLUE SHIELD | Admitting: Gastroenterology

## 2016-09-08 ENCOUNTER — Emergency Department (HOSPITAL_COMMUNITY): Payer: Self-pay

## 2016-09-08 ENCOUNTER — Emergency Department (HOSPITAL_COMMUNITY)
Admission: EM | Admit: 2016-09-08 | Discharge: 2016-09-08 | Disposition: A | Discharge status: Home / Self Care | Payer: Self-pay | Attending: Emergency Medicine | Admitting: Emergency Medicine

## 2016-09-08 ENCOUNTER — Encounter (HOSPITAL_COMMUNITY): Payer: Self-pay | Admitting: Emergency Medicine

## 2016-09-08 DIAGNOSIS — J449 Chronic obstructive pulmonary disease, unspecified: Secondary | ICD-10-CM | POA: Insufficient documentation

## 2016-09-08 DIAGNOSIS — Z7982 Long term (current) use of aspirin: Secondary | ICD-10-CM | POA: Insufficient documentation

## 2016-09-08 DIAGNOSIS — Z87891 Personal history of nicotine dependence: Secondary | ICD-10-CM | POA: Insufficient documentation

## 2016-09-08 DIAGNOSIS — Z79899 Other long term (current) drug therapy: Secondary | ICD-10-CM | POA: Insufficient documentation

## 2016-09-08 DIAGNOSIS — R079 Chest pain, unspecified: Secondary | ICD-10-CM | POA: Insufficient documentation

## 2016-09-08 DIAGNOSIS — R0602 Shortness of breath: Secondary | ICD-10-CM | POA: Insufficient documentation

## 2016-09-08 DIAGNOSIS — R070 Pain in throat: Secondary | ICD-10-CM | POA: Insufficient documentation

## 2016-09-08 LAB — BASIC METABOLIC PANEL
Anion gap: 4 — ABNORMAL LOW (ref 5–15)
BUN: 11 mg/dL (ref 6–20)
CHLORIDE: 106 mmol/L (ref 101–111)
CO2: 28 mmol/L (ref 22–32)
CREATININE: 0.75 mg/dL (ref 0.44–1.00)
Calcium: 8.9 mg/dL (ref 8.9–10.3)
GFR calc Af Amer: >60 mL/min (ref >60)
GFR calc non Af Amer: >60 mL/min (ref >60)
GLUCOSE: 79 mg/dL (ref 65–99)
POTASSIUM: 3.9 mmol/L (ref 3.5–5.1)
SODIUM: 138 mmol/L (ref 135–145)

## 2016-09-08 LAB — URINALYSIS, ROUTINE W REFLEX MICROSCOPIC
BILIRUBIN URINE: NEGATIVE
GLUCOSE, UA: NEGATIVE mg/dL
HGB URINE DIPSTICK: NEGATIVE
KETONES UR: NEGATIVE mg/dL
Leukocytes, UA: NEGATIVE
Nitrite: NEGATIVE
PH: 5.5 (ref 5.0–8.0)
Protein, ur: NEGATIVE mg/dL
SPECIFIC GRAVITY, URINE: 1.025 (ref 1.005–1.030)

## 2016-09-08 LAB — CBC
HEMATOCRIT: 43.8 % (ref 36.0–46.0)
Hemoglobin: 15.1 g/dL — ABNORMAL HIGH (ref 12.0–15.0)
MCH: 32.1 pg (ref 26.0–34.0)
MCHC: 34.5 g/dL (ref 30.0–36.0)
MCV: 93.2 fL (ref 78.0–100.0)
PLATELETS: 280 10*3/uL (ref 150–400)
RBC: 4.7 MIL/uL (ref 3.87–5.11)
RDW: 13.8 % (ref 11.5–15.5)
WBC: 15.4 10*3/uL — AB (ref 4.0–10.5)

## 2016-09-08 LAB — TROPONIN I: Troponin I: <0.03 ng/mL (ref <0.03)

## 2016-09-08 MED ORDER — IOPAMIDOL (ISOVUE-370) INJECTION 76%
100.0000 mL | Freq: Once | INTRAVENOUS | Status: AC | PRN
  Administered 2016-09-08: 100 mL via INTRAVENOUS

## 2016-09-08 MED ORDER — GI COCKTAIL ~~LOC~~
30.0000 mL | Freq: Once | ORAL | Status: AC
  Administered 2016-09-08: 30 mL via ORAL
  Filled 2016-09-08: qty 30

## 2016-09-08 MED ORDER — HYDROMORPHONE HCL 1 MG/ML IJ SOLN
0.5000 mg | Freq: Once | INTRAMUSCULAR | Status: AC
  Administered 2016-09-08: 0.5 mg via INTRAVENOUS
  Filled 2016-09-08: qty 1

## 2016-09-08 MED ORDER — HYDROCODONE-ACETAMINOPHEN 5-325 MG PO TABS
1.0000 | ORAL_TABLET | Freq: Once | ORAL | Status: AC
  Administered 2016-09-08: 1 via ORAL
  Filled 2016-09-08: qty 1

## 2016-09-08 NOTE — ED Notes (Signed)
Patient transported to CT 

## 2016-09-08 NOTE — ED Notes (Signed)
Pt ambulated to [facility]

## 2016-09-08 NOTE — ED Triage Notes (Signed)
Pt reports pain in throat that goes all the way down chest into left arm since yesterday with sob, nausea, and dizziness.  Pt took 4 81mg  ASA last night and 2 81mg  ASA this morning and xanax.  Pt alert and oriented, ambulatory.

## 2016-09-08 NOTE — ED Provider Notes (Signed)
[facility]-EMERGENCY DEPT Provider Note   CSN: [id number] Arrival date & time: 09/08/16  1002  By signing my name below, I, Sonum Patel, attest that this documentation has been prepared under the direction and in the presence of Azalia Bilis, MD. Electronically Signed: Sonum Patel, Neurosurgeon. 09/08/16. 10:53 AM.  History   Chief Complaint Chief Complaint  Patient presents with  . Chest Pain    The history is provided by the patient. No language interpreter was used.     HPI Comments: Sharon Gay is a 50 y.o. female with past medical history of mitral valve prolapse, COPD, and MS who presents to the Emergency Department complaining of throbbing throat pain along with chest pain with radiation into left arm beginning last night at 10:30PM. She took 4 baby ASA and Phenergan last night after which she was able to sleep. She states she woke this morning with unchanged pain to the same area. She reports associated SOB. She states her throat is not sore and it does not hurt to swallow. She states she continues to smoke. She denies history of DVT or PE.   Past Medical History:  Diagnosis Date  . AC (acromioclavicular) joint bone spurs   . Anxiety   . Arthritis   . Back pain   . Chest pain 2003   11/2007: Admission with unstable angina; normal coronary angiography  with [facility]; nl EF  . Chronic pain   . Colitis 2005  . COPD (chronic obstructive pulmonary disease) (HCC)    11/2003: Normal PFTs ex minimal increased volumes and decreased the DLCO; Followed in Pulmonary clinic/ [facility]/ [place]   . Depression   . Gastroesophageal reflux disease 2009   Hiatal hernia; esophageal dilatation for Schatzki's ring in 04/2008; chronic constipation; irritable bowel syndrome  . History of pneumonia   . Hyperlipidemia 2009   Lipid profile 07/2012:227, 91, 56, 153  . Lung disease 2010  . Migraines   . Mitral valve prolapse   . Mitral valve prolapse   . MS (multiple  sclerosis) (HCC)   . MS (multiple sclerosis) (HCC) 2001  . Mycobacterium avium complex (HCC) 2010   01/2009: + AFB stain on bronchial washings  . PONV (postoperative nausea and vomiting)   . Postmenopausal 10/31/2015  . Smoker 10/31/2015    Patient Active Problem List   Diagnosis Date Noted  . Smoker 10/31/2015  . Postmenopausal 10/31/2015  . History of colonic polyps 10/20/2015  . Dysphagia 10/20/2015  . Hepatomegaly 10/20/2015  . Lymphadenopathy 09/07/2015  . CAP (community acquired pneumonia) 08/25/2015  . Severe sepsis with septic shock (HCC) 08/25/2015  . SIRS (systemic inflammatory response syndrome) (HCC) 08/25/2015  . Abdominal pain 08/09/2015  . 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 History of diagnostic tests 10/06/2012  . Hyperlipidemia   . Mitral valve prolapse   . Anxiety   . COPD (chronic obstructive pulmonary disease) (HCC)   . Chest pain   . Gastroesophageal reflux disease   . MIGRAINE HEADACHE 03/30/2009    Past Surgical History:  Procedure Laterality Date  . COLONOSCOPY W/ POLYPECTOMY  2006   Dr. Jena Gauss: 3 diminutive polyps in descending colon, unknown path   . ESOPHAGOGASTRODUODENOSCOPY  2009   Dr. Jena Gauss: normal esophagus, small hiatal hernia, s/p empiric dilatilon   . ESOPHAGOGASTRODUODENOSCOPY  2003   Schatzki's ring s/p dilation  . FIBEROPTIC BRONCHOSCOPY  2010  . LIVER BIOPSY    . ORIF RADIAL FRACTURE  2012   Left  . VOCAL  CORD POLYP REMOVED  2003    OB History    Gravida Para Term Preterm AB Living   0 0 0 0 0 0   SAB TAB Ectopic Multiple Live Births   0 0 0 0         Home Medications    Prior to Admission medications   Medication Sig Start Date End Date Taking? Authorizing Provider  albuterol (PROVENTIL HFA;VENTOLIN HFA) 108 (90 BASE) MCG/ACT inhaler Inhale 1-2 puffs into the lungs every 6 (six) hours as needed for wheezing or shortness of breath. 08/25/15   Ivery Quale, PA-C  ALPRAZolam Prudy Feeler) 1 MG tablet  Take 1 mg by mouth 4 (four) times daily as needed for anxiety.    Historical Provider, MD  aspirin EC 81 MG tablet Take 81 mg by mouth daily.    Historical Provider, MD  benzonatate (TESSALON) 100 MG capsule Take 100 mg by mouth 3 (three) times daily. 12/01/15   Historical Provider, MD  Cholecalciferol (VITAMIN D3) 50000 units CAPS Take 1 capsule by mouth once a week.    Historical Provider, MD  dexlansoprazole (DEXILANT) 60 MG capsule Take 60 mg by mouth daily.    Historical Provider, MD  fluticasone (FLONASE) 50 MCG/ACT nasal spray Place 2 sprays into both nostrils 3 (three) times daily.    Historical Provider, MD  HM NICOTINE 21 MG/24HR patch Apply 21 mg topically. 11/01/15   Historical Provider, MD  lidocaine (XYLOCAINE) 5 % ointment Apply 1 application topically daily as needed. 11/28/15   Historical Provider, MD  meloxicam (MOBIC) 7.5 MG tablet Take 7.5 mg by mouth 2 (two) times daily as needed for pain.    Historical Provider, MD  oxycodone (ROXICODONE) 30 MG immediate release tablet Take 30-60 mg by mouth every 4 (four) hours as needed for pain.    Historical Provider, MD  oxyCODONE-acetaminophen (PERCOCET) 10-325 MG tablet 1 tablet every 4 (four) hours as needed.  10/20/15   Historical Provider, MD  PARoxetine (PAXIL) 20 MG tablet Take 20 mg by mouth daily.    Historical Provider, MD  promethazine (PHENERGAN) 25 MG tablet Take 25 mg by mouth every 6 (six) hours as needed for nausea or vomiting.     Historical Provider, MD  Sod Picosulfate-Mag Ox-Cit Acd 10-3.5-12 MG-GM-GM PACK Take 1 Container by mouth as directed. 02/01/16   Corbin Ade, MD  sodium chloride (OCEAN) 0.65 % SOLN nasal spray Place 1 spray into both nostrils as needed for congestion.    Historical Provider, MD  Vitamin D, Ergocalciferol, (DRISDOL) 50000 units CAPS capsule Take 1 capsule by mouth every 30 (thirty) days. 12/07/15   Historical Provider, MD    Family History Family History  Problem Relation Age of Onset  .  Emphysema Maternal Grandfather     was a smoker  . Arthritis Sister   . Mental retardation Sister   . Heart disease Sister     Also brother; cardiomyopathy  . Thyroid disease Sister   . Fibromyalgia Sister   . Arthritis Sister   . Migraines Sister   . COPD Sister   . Heart attack Father 68  . Arthritis Sister   . Migraines Sister   . Other Sister     brain tumor  . Arthritis Brother   . Arthritis Brother   . Colon cancer Neg Hx     Social History Social History  Substance Use Topics  . Smoking status: Former Smoker    Packs/day: 0.25    Years: 30.00  Types: Cigarettes    Quit date: 12/14/2015  . Smokeless tobacco: Not on file     Comment: smokes 4-5 cig daily  . Alcohol use No     Allergies   Penicillins; Morphine; Rifampin; Topamax [topiramate]; Cymbalta [duloxetine hcl]; and Tequin [gatifloxacin]   Review of Systems Review of Systems  HENT: Negative for sore throat and trouble swallowing.        +throat pain  Respiratory: Positive for shortness of breath.   Cardiovascular: Positive for chest pain.  All other systems reviewed and are negative.    Physical Exam Updated Vital Signs BP 128/90 (BP Location: Right Arm)   Pulse 89   Temp 98.4 F (36.9 C) (Oral)   Resp 18   Ht 5\' 11"  (1.803 m)   Wt 149 lb (67.6 kg)   SpO2 100%   BMI 20.78 kg/m   Physical Exam  Constitutional: She is oriented to person, place, and time. She appears well-developed and well-nourished. No distress.  HENT:  Head: Normocephalic and atraumatic.  Mouth/Throat: Uvula is midline. Posterior oropharyngeal erythema present. No oropharyngeal exudate. No tonsillar exudate.  Mild erythema of the posterior oropharynx, uvula midline. No tonsillar exudate.   Eyes: EOM are normal.  Neck: Normal range of motion.  Cardiovascular: Normal rate, regular rhythm and normal heart sounds.   Pulmonary/Chest: Effort normal and breath sounds normal.  Abdominal: Soft. She exhibits no distension.  There is no tenderness.  Musculoskeletal: Normal range of motion.  Neurological: She is alert and oriented to person, place, and time.  Skin: Skin is warm and dry.  Psychiatric: She has a normal mood and affect. Judgment normal.  Nursing note and vitals reviewed.    ED Treatments / Results  DIAGNOSTIC STUDIES: Oxygen Saturation is 100% on RA, normal by my interpretation.    COORDINATION OF CARE: 10:25 AM Discussed treatment plan with pt at bedside and pt agreed to plan.   Labs (all labs ordered are listed, but only abnormal results are displayed) Labs Reviewed  BASIC METABOLIC PANEL - Abnormal; Notable for the following:       Result Value   Anion gap 4 (*)    All other components within normal limits  CBC - Abnormal; Notable for the following:    WBC 15.4 (*)    Hemoglobin 15.1 (*)    All other components within normal limits  TROPONIN I  URINALYSIS, ROUTINE W REFLEX MICROSCOPIC    EKG  EKG Interpretation  Date/Time:  Sunday September 08 2016 10:12:15 EST Ventricular Rate:  91 PR Interval:    QRS Duration: 97 QT Interval:  359 QTC Calculation: 442 R Axis:   -21 Text Interpretation:  Sinus rhythm Borderline left axis deviation Borderline ST depression, lateral leads No significant change was found Confirmed by Kash Davie  MD, Fanta Wimberley ([id number]) on 09/08/2016 11:01:38 AM       Radiology Dg Chest 2 View  Result Date: 09/08/2016 CLINICAL DATA:  Upper chest pain. EXAM: CHEST  2 VIEW COMPARISON:  08/25/2015 FINDINGS: The heart size and mediastinal contours are within normal limits. There is a focal area of infiltrate or scarring at the right lung base posteriorly. The lungs are otherwise clear. Chronic slight accentuation of the upper thoracic kyphosis. IMPRESSION: Focal area of infiltrate or scarring at the right lung base posteriorly. That area was relatively clear on the CT scan of 09/21/2015. Electronically Signed   By: Francene Boyers M.D.   On: 09/08/2016 11:18   Ct Angio  Chest Pe W And/or  Wo Contrast  Result Date: 09/08/2016 CLINICAL DATA:  Neck and chest pain. Abnormal cxr today. Hx of COPD,mitral valve prolapse, MS, smoker/bbj EXAM: CT ANGIOGRAPHY CHEST WITH CONTRAST TECHNIQUE: Multidetector CT imaging of the chest was performed using the standard protocol during bolus administration of intravenous contrast. Multiplanar CT image reconstructions and MIPs were obtained to evaluate the vascular anatomy. CONTRAST:  100 mL of Isovue 370 intravenous contrast COMPARISON:  Current chest radiograph.  Chest CT, 09/21/2015 FINDINGS: Cardiovascular: Satisfactory opacification of the pulmonary arteries to the segmental level. No evidence of pulmonary embolism. Normal heart size. No pericardial effusion. Mild coronary artery calcifications. Great vessels normal in caliber. No significant aortic or branch vessel plaque. Mediastinum/Nodes: No enlarged mediastinal, hilar, or axillary lymph nodes. Thyroid gland, trachea, and esophagus demonstrate no significant findings. Lungs/Pleura: Minor subsegmental dependent lower lobe atelectasis. There is no consolidation to suggest pneumonia. The opacity noted on the current chest radiograph may in part be due to the minor subsegmental atelectasis. Minor changes of centrilobular emphysema noted in the upper lobes. No lung mass or suspicious nodule. No evidence of pulmonary edema. No pleural effusion or pneumothorax. Upper Abdomen: No acute abnormality. Musculoskeletal: No acute bony abnormality. No osteoblastic or osteolytic lesions. Mild disc degenerative changes along the mid thoracic spine. Review of the MIP images confirms the above findings. IMPRESSION: 1. No evidence of a pulmonary embolism. 2. No acute findings. 3. No evidence of pneumonia. Opacity noted on the current chest radiograph is partially explained by minor subsegmental atelectasis at the lung bases. Electronically Signed   By: Amie Portlandavid  Ormond M.D.   On: 09/08/2016 13:26     Procedures Procedures (including critical care time)  Medications Ordered in ED Medications  gi cocktail (Maalox,Lidocaine,Donnatal) (30 mLs Oral Given 09/08/16 1035)  HYDROcodone-acetaminophen (NORCO/VICODIN) 5-325 MG per tablet 1 tablet (1 tablet Oral Given 09/08/16 1035)  HYDROmorphone (DILAUDID) injection 0.5 mg (0.5 mg Intravenous Given 09/08/16 1234)  iopamidol (ISOVUE-370) 76 % injection 100 mL (100 mLs Intravenous Contrast Given 09/08/16 1305)     Initial Impression / Assessment and Plan / ED Course  I have reviewed the triage vital signs and the nursing notes.  Pertinent labs & imaging results that were available during my care of the patient were reviewed by me and considered in my medical decision making (see chart for details).  Clinical Course     Doubt ACS.  No evidence of pulmonary embolism on CT imaging today.  Overall well-appearing.  Close outpatient follow-up.  Patient understands return to the ER for new or worsening symptoms  Final Clinical Impressions(s) / ED Diagnoses   Final diagnoses:  Chest pain, unspecified type    New Prescriptions Discharge Medication List as of 09/08/2016  1:57 PM     I personally performed the services described in this documentation, which was scribed in my presence. The recorded information has been reviewed and is accurate.        Azalia BilisKevin Kayla Weekes, MD 09/08/16 [phone]

## 2016-09-20 ENCOUNTER — Other Ambulatory Visit (HOSPITAL_COMMUNITY): Payer: Self-pay | Admitting: Internal Medicine

## 2016-09-20 DIAGNOSIS — R1310 Dysphagia, unspecified: Secondary | ICD-10-CM

## 2016-10-10 NOTE — Progress Notes (Deleted)
Cardiology Office Note   Date:  10/10/2016   ID:  Sharon Gay, DOB 25-Oct-1965, MRN [id number]  PCP:  Glo Herring., MD  Cardiologist:   Jenkins Rouge, MD   No chief complaint on file.     History of Present Illness: Sharon Gay is a 51 y.o. female who presents for evaluation of chest pain. Seen by Dr Gerarda Fraction on 09/20/16 Pain 2 weeks. Crushing pain upper sternum radiates to neck and left shoulder  Seen in ED 09/08/16  Also complained of throat pain No help with GI cocktail and phenergan. History of hiatal hernia, with esophageal dilatation and Schatzki ring And irritable bowel.    R/O no acute ECG changes negative troponin CTA no PE    She is a smoker with COPD   Normal cath 2009 [facility]   Past Medical History:  Diagnosis Date  . AC (acromioclavicular) joint bone spurs   . Anxiety   . Arthritis   . Back pain   . Chest pain 2003   11/2007: Admission with unstable angina; normal coronary angiography  with [facility]; nl EF  . Chronic pain   . Colitis 2005  . COPD (chronic obstructive pulmonary disease) ([place])    11/2003: Normal PFTs ex minimal increased volumes and decreased the DLCO; Followed in Pulmonary clinic/ [facility]/ [facility]   . Depression   . Gastroesophageal reflux disease 2009   Hiatal hernia; esophageal dilatation for Schatzki's ring in 04/2008; chronic constipation; irritable bowel syndrome  . History of pneumonia   . Hyperlipidemia 2009   Lipid profile 07/2012:227, 91, 56, 153  . Lung disease 2010  . Migraines   . Mitral valve prolapse   . Mitral valve prolapse   . MS (multiple sclerosis) ([place])   . MS (multiple sclerosis) ([place]) 2001  . Mycobacterium avium complex ([place]) 2010   01/2009: + AFB stain on bronchial washings  . PONV (postoperative nausea and vomiting)   . Postmenopausal 10/31/2015  . Smoker 10/31/2015    Past Surgical History:  Procedure Laterality Date  . COLONOSCOPY W/ POLYPECTOMY  2006   Dr. Gala Romney: 3  diminutive polyps in descending colon, unknown path   . ESOPHAGOGASTRODUODENOSCOPY  2009   Dr. Gala Romney: normal esophagus, small hiatal hernia, s/p empiric dilatilon   . ESOPHAGOGASTRODUODENOSCOPY  2003   Schatzki's ring s/p dilation  . FIBEROPTIC BRONCHOSCOPY  2010  . LIVER BIOPSY    . ORIF RADIAL FRACTURE  2012   Left  . VOCAL CORD POLYP REMOVED  2003     Current Outpatient Prescriptions  Medication Sig Dispense Refill  . albuterol (PROVENTIL HFA;VENTOLIN HFA) 108 (90 BASE) MCG/ACT inhaler Inhale 1-2 puffs into the lungs every 6 (six) hours as needed for wheezing or shortness of breath. 1 Inhaler 0  . ALPRAZolam (XANAX) 1 MG tablet Take 1 mg by mouth 4 (four) times daily as needed for anxiety.    Marland Kitchen aspirin EC 81 MG tablet Take 81 mg by mouth daily.    . benzonatate (TESSALON) 100 MG capsule Take 100 mg by mouth 3 (three) times daily.  3  . Cholecalciferol (VITAMIN D3) 50000 units CAPS Take 1 capsule by mouth once a week.    Marland Kitchen dexlansoprazole (DEXILANT) 60 MG capsule Take 60 mg by mouth daily.    . fluticasone (FLONASE) 50 MCG/ACT nasal spray Place 2 sprays into both nostrils 3 (three) times daily.    Marland Kitchen HM NICOTINE 21 MG/24HR patch Apply 21 mg topically.    Marland Kitchen  lidocaine (XYLOCAINE) 5 % ointment Apply 1 application topically daily as needed.    . meloxicam (MOBIC) 7.5 MG tablet Take 7.5 mg by mouth 2 (two) times daily as needed for pain.    Marland Kitchen oxycodone (ROXICODONE) 30 MG immediate release tablet Take 30-60 mg by mouth every 4 (four) hours as needed for pain.    Marland Kitchen oxyCODONE-acetaminophen (PERCOCET) 10-325 MG tablet 1 tablet every 4 (four) hours as needed.     Marland Kitchen PARoxetine (PAXIL) 20 MG tablet Take 20 mg by mouth daily.    . promethazine (PHENERGAN) 25 MG tablet Take 25 mg by mouth every 6 (six) hours as needed for nausea or vomiting.     . sodium chloride (OCEAN) 0.65 % SOLN nasal spray Place 1 spray into both nostrils as needed for congestion.     No current facility-administered  medications for this visit.     Allergies:   Penicillins; Morphine; Rifampin; Topamax [topiramate]; Cymbalta [duloxetine hcl]; and Tequin [gatifloxacin]    Social History:  The patient  reports that she quit smoking about 9 months ago. Her smoking use included Cigarettes. She has a 7.50 pack-year smoking history. She does not have any smokeless tobacco history on file. She reports that she does not drink alcohol or use drugs.   Family History:  The patient's family history includes Arthritis in her brother, brother, sister, sister, and sister; COPD in her sister; Emphysema in her maternal grandfather; Fibromyalgia in her sister; Heart attack (age of onset: 40) in her father; Heart disease in her sister; Mental retardation in her sister; Migraines in her sister and sister; Other in her sister; Thyroid disease in her sister.    ROS:  Please see the history of present illness.   Otherwise, review of systems are positive for {NONE DEFAULTED:18576::"none"}.   All other systems are reviewed and negative.    PHYSICAL EXAM: VS:  There were no vitals taken for this visit. , BMI There is no height or weight on file to calculate BMI. Affect appropriate Healthy:  appears stated age 11: normal Neck supple with no adenopathy JVP normal no bruits no thyromegaly Lungs clear with no wheezing and good diaphragmatic motion Heart:  S1/S2 no murmur, no rub, gallop or click PMI normal Abdomen: benighn, BS positve, no tenderness, no AAA no bruit.  No HSM or HJR Distal pulses intact with no bruits No edema Neuro non-focal Skin warm and dry No muscular weakness    EKG:  08/01/12  NSR normal 07/31/12  09/10/16  SR LAD no acute ST changes    Recent Labs: 11/21/2015: ALT 22 09/08/2016: BUN 11; Creatinine, Ser 0.75; Hemoglobin 15.1; Platelets 280; Potassium 3.9; Sodium 138    Lipid Panel No results found for: CHOL, TRIG, HDL, CHOLHDL, VLDL, LDLCALC, LDLDIRECT    Wt Readings from Last 3 Encounters:    09/08/16 149 lb (67.6 kg)  02/01/16 141 lb 6.4 oz (64.1 kg)  12/21/15 139 lb 3.2 oz (63.1 kg)      Other studies Reviewed: Additional studies/ records that were reviewed today include: ***.    ASSESSMENT AND PLAN:  1.  ***   Current medicines are reviewed at length with the patient today.  The patient {ACTIONS; HAS/DOES NOT HAVE:19233} concerns regarding medicines.  The following changes have been made:  {PLAN; NO CHANGE:13088:s}  Labs/ tests ordered today include: *** No orders of the defined types were placed in this encounter.    Disposition:   FU with ***     Signed,  Jenkins Rouge, MD  10/10/2016 1:54 PM    [facility] [address], [place], [place]  [address] Phone: [phone]; Fax: [phone]

## 2016-10-14 ENCOUNTER — Ambulatory Visit: Payer: Self-pay | Admitting: Cardiovascular Disease

## 2016-11-01 ENCOUNTER — Ambulatory Visit: Payer: Self-pay | Admitting: Cardiology

## 2016-11-01 ENCOUNTER — Encounter: Payer: Self-pay | Admitting: Cardiology

## 2016-11-01 NOTE — Progress Notes (Deleted)
Clinical Summary Sharon Gay is a 51 y.o.female  1. Chest pain - from notes long history of chest pain. seen by Dr Lattie Haw in 2014 for chest pain. Had previously been seen by Dr Gwenlyn Found at [facility] for chest pain - cath 11/2007 without significant CAD.  - seen in ER 09/08/16 with chest pain.  - CT PE negative.    CAD risk factors   - 2. COPD  3. GERD  4. Mitral valve prolapse Past Medical History:  Diagnosis Date  . AC (acromioclavicular) joint bone spurs   . Anxiety   . Arthritis   . Back pain   . Chest pain 2003   11/2007: Admission with unstable angina; normal coronary angiography  with [facility]; nl EF  . Chronic pain   . Colitis 2005  . COPD (chronic obstructive pulmonary disease) ([place])    11/2003: Normal PFTs ex minimal increased volumes and decreased the DLCO; Followed in Pulmonary clinic/ [facility]/ [facility]   . Depression   . Gastroesophageal reflux disease 2009   Hiatal hernia; esophageal dilatation for Schatzki's ring in 04/2008; chronic constipation; irritable bowel syndrome  . History of pneumonia   . Hyperlipidemia 2009   Lipid profile 07/2012:227, 91, 56, 153  . Lung disease 2010  . Migraines   . Mitral valve prolapse   . Mitral valve prolapse   . MS (multiple sclerosis) ([place])   . MS (multiple sclerosis) ([place]) 2001  . 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 PONV (postoperative nausea and vomiting)   . Postmenopausal 10/31/2015  . Smoker 10/31/2015     Allergies  Allergen Reactions  . Penicillins Anaphylaxis, Nausea And Vomiting and Swelling    Has patient had a PCN reaction causing immediate rash, facial/tongue/throat swelling, SOB or lightheadedness with hypotension: No Has patient had a PCN reaction causing severe rash involving mucus membranes or skin necrosis: No Has patient had a PCN reaction that required hospitalization No Has patient had a PCN reaction occurring  within the last 10 years: No If all of the above answers are "NO", then may proceed with Cephalosporin use.   Marland Kitchen Morphine Other (See Comments)    Hallucinations  . Rifampin Nausea Only and Other (See Comments)    Blisters around lips and tongue Discolored orange urine and yellowed skin expected with Rifampin, not adverse reaction  . Topamax [Topiramate] Other (See Comments)    Headache and confusion.  Marland Kitchen Cymbalta [Duloxetine Hcl] Other (See Comments)    Dizziness   . Tequin [Gatifloxacin] Nausea And Vomiting     Current Outpatient Prescriptions  Medication Sig Dispense Refill  . albuterol (PROVENTIL HFA;VENTOLIN HFA) 108 (90 BASE) MCG/ACT inhaler Inhale 1-2 puffs into the lungs every 6 (six) hours as needed for wheezing or shortness of breath. 1 Inhaler 0  . ALPRAZolam (XANAX) 1 MG tablet Take 1 mg by mouth 4 (four) times daily as needed for anxiety.    Marland Kitchen aspirin EC 81 MG tablet Take 81 mg by mouth daily.    . benzonatate (TESSALON) 100 MG capsule Take 100 mg by mouth 3 (three) times daily.  3  . Cholecalciferol (VITAMIN D3) 50000 units CAPS Take 1 capsule by mouth once a week.    Marland Kitchen dexlansoprazole (DEXILANT) 60 MG capsule Take 60 mg by mouth daily.    . fluticasone (FLONASE) 50 MCG/ACT nasal spray Place 2 sprays into both nostrils 3 (three) times daily.    Marland Kitchen  HM NICOTINE 21 MG/24HR patch Apply 21 mg topically.    . lidocaine (XYLOCAINE) 5 % ointment Apply 1 application topically daily as needed.    . meloxicam (MOBIC) 7.5 MG tablet Take 7.5 mg by mouth 2 (two) times daily as needed for pain.    Marland Kitchen oxycodone (ROXICODONE) 30 MG immediate release tablet Take 30-60 mg by mouth every 4 (four) hours as needed for pain.    Marland Kitchen oxyCODONE-acetaminophen (PERCOCET) 10-325 MG tablet 1 tablet every 4 (four) hours as needed.     Marland Kitchen PARoxetine (PAXIL) 20 MG tablet Take 20 mg by mouth daily.    . promethazine (PHENERGAN) 25 MG tablet Take 25 mg by mouth every 6 (six) hours as needed for nausea or vomiting.      . sodium chloride (OCEAN) 0.65 % SOLN nasal spray Place 1 spray into both nostrils as needed for congestion.     No current facility-administered medications for this visit.      Past Surgical History:  Procedure Laterality Date  . COLONOSCOPY W/ POLYPECTOMY  2006   Dr. Gala Romney: 3 diminutive polyps in descending colon, unknown path   . ESOPHAGOGASTRODUODENOSCOPY  2009   Dr. Gala Romney: normal esophagus, small hiatal hernia, s/p empiric dilatilon   . ESOPHAGOGASTRODUODENOSCOPY  2003   Schatzki's ring s/p dilation  . FIBEROPTIC BRONCHOSCOPY  2010  . LIVER BIOPSY    . ORIF RADIAL FRACTURE  2012   Left  . VOCAL CORD POLYP REMOVED  2003     Allergies  Allergen Reactions  . Penicillins Anaphylaxis, Nausea And Vomiting and Swelling    Has patient had a PCN reaction causing immediate rash, facial/tongue/throat swelling, SOB or lightheadedness with hypotension: No Has patient had a PCN reaction causing severe rash involving mucus membranes or skin necrosis: No Has patient had a PCN reaction that required hospitalization No Has patient had a PCN reaction occurring within the last 10 years: No If all of the above answers are "NO", then may proceed with Cephalosporin use.   Marland Kitchen Morphine Other (See Comments)    Hallucinations  . Rifampin Nausea Only and Other (See Comments)    Blisters around lips and tongue Discolored orange urine and yellowed skin expected with Rifampin, not adverse reaction  . Topamax [Topiramate] Other (See Comments)    Headache and confusion.  Marland Kitchen Cymbalta [Duloxetine Hcl] Other (See Comments)    Dizziness   . Tequin [Gatifloxacin] Nausea And Vomiting      Family History  Problem Relation Age of Onset  . Emphysema Maternal Grandfather     was a smoker  . Arthritis Sister   . Mental retardation Sister   . Heart disease Sister     Also brother; cardiomyopathy  . Thyroid disease Sister   . Fibromyalgia Sister   . Arthritis Sister   . Migraines Sister   . COPD  Sister   . Heart attack Father 15  . Arthritis Sister   . Migraines Sister   . Other Sister     brain tumor  . Arthritis Brother   . Arthritis Brother   . Colon cancer Neg Hx      Social History Ms. Schimpf reports that she quit smoking about 10 months ago. Her smoking use included Cigarettes. She has a 7.50 pack-year smoking history. She does not have any smokeless tobacco history on file. Ms. Kilburg reports that she does not drink alcohol.   Review of Systems CONSTITUTIONAL: No weight loss, fever, chills, weakness or fatigue.  HEENT: Eyes:  No visual loss, blurred vision, double vision or yellow sclerae.No hearing loss, sneezing, congestion, runny nose or sore throat.  SKIN: No rash or itching.  CARDIOVASCULAR:  RESPIRATORY: No shortness of breath, cough or sputum.  GASTROINTESTINAL: No anorexia, nausea, vomiting or diarrhea. No abdominal pain or blood.  GENITOURINARY: No burning on urination, no polyuria NEUROLOGICAL: No headache, dizziness, syncope, paralysis, ataxia, numbness or tingling in the extremities. No change in bowel or bladder control.  MUSCULOSKELETAL: No muscle, back pain, joint pain or stiffness.  LYMPHATICS: No enlarged nodes. No history of splenectomy.  PSYCHIATRIC: No history of depression or anxiety.  ENDOCRINOLOGIC: No reports of sweating, cold or heat intolerance. No polyuria or polydipsia.  Marland Kitchen   Physical Examination There were no vitals filed for this visit. There were no vitals filed for this visit.  Gen: resting comfortably, no acute distress HEENT: no scleral icterus, pupils equal round and reactive, no palptable cervical adenopathy,  CV Resp: Clear to auscultation bilaterally GI: abdomen is soft, non-tender, non-distended, normal bowel sounds, no hepatosplenomegaly MSK: extremities are warm, no edema.  Skin: warm, no rash Neuro:  no focal deficits Psych: appropriate affect   Diagnostic Studies 08/2016 CT PE FINDINGS: Cardiovascular:  Satisfactory opacification of the pulmonary arteries to the segmental level. No evidence of pulmonary embolism. Normal heart size. No pericardial effusion. Mild coronary artery calcifications. Great vessels normal in caliber. No significant aortic or Burleigh Brockmann vessel plaque.  Mediastinum/Nodes: No enlarged mediastinal, hilar, or axillary lymph nodes. Thyroid gland, trachea, and esophagus demonstrate no significant findings.  Lungs/Pleura: Minor subsegmental dependent lower lobe atelectasis. There is no consolidation to suggest pneumonia. The opacity noted on the current chest radiograph may in part be due to the minor subsegmental atelectasis. Minor changes of centrilobular emphysema noted in the upper lobes. No lung mass or suspicious nodule. No evidence of pulmonary edema. No pleural effusion or pneumothorax.  Upper Abdomen: No acute abnormality.  Musculoskeletal: No acute bony abnormality. No osteoblastic or osteolytic lesions. Mild disc degenerative changes along the mid thoracic spine.  Review of the MIP images confirms the above findings.  IMPRESSION: 1. No evidence of a pulmonary embolism. 2. No acute findings. 3. No evidence of pneumonia. Opacity noted on the current chest radiograph is partially explained by minor subsegmental atelectasis at the lung bases.  11/2007 cath  RESULTS:  1. Angiographically patent coronary arteries.  2. Normal LV systolic function.  3. Atypical chest pain.  4. Chronic tobacco use.  Assessment and Plan        Arnoldo Lenis, M.D., F.A.C.C.

## 2016-11-22 ENCOUNTER — Other Ambulatory Visit (HOSPITAL_COMMUNITY): Payer: Self-pay | Admitting: Internal Medicine

## 2016-11-22 DIAGNOSIS — J984 Other disorders of lung: Secondary | ICD-10-CM

## 2016-12-04 ENCOUNTER — Ambulatory Visit (HOSPITAL_COMMUNITY): Payer: Self-pay

## 2016-12-04 ENCOUNTER — Encounter (HOSPITAL_COMMUNITY): Payer: Self-pay

## 2017-03-04 ENCOUNTER — Emergency Department (HOSPITAL_COMMUNITY): Payer: Self-pay

## 2017-03-04 ENCOUNTER — Emergency Department (HOSPITAL_COMMUNITY)
Admission: EM | Admit: 2017-03-04 | Discharge: 2017-03-04 | Disposition: A | Discharge status: Home / Self Care | Payer: Self-pay | Attending: Emergency Medicine | Admitting: Emergency Medicine

## 2017-03-04 ENCOUNTER — Encounter (HOSPITAL_COMMUNITY): Payer: Self-pay | Admitting: *Deleted

## 2017-03-04 DIAGNOSIS — Z79899 Other long term (current) drug therapy: Secondary | ICD-10-CM | POA: Insufficient documentation

## 2017-03-04 DIAGNOSIS — Z885 Allergy status to narcotic agent status: Secondary | ICD-10-CM | POA: Insufficient documentation

## 2017-03-04 DIAGNOSIS — R51 Headache: Secondary | ICD-10-CM | POA: Insufficient documentation

## 2017-03-04 DIAGNOSIS — Z880 Allergy status to penicillin: Secondary | ICD-10-CM | POA: Insufficient documentation

## 2017-03-04 DIAGNOSIS — J449 Chronic obstructive pulmonary disease, unspecified: Secondary | ICD-10-CM | POA: Insufficient documentation

## 2017-03-04 DIAGNOSIS — Z87891 Personal history of nicotine dependence: Secondary | ICD-10-CM | POA: Insufficient documentation

## 2017-03-04 DIAGNOSIS — Z7982 Long term (current) use of aspirin: Secondary | ICD-10-CM | POA: Insufficient documentation

## 2017-03-04 DIAGNOSIS — R519 Headache, unspecified: Secondary | ICD-10-CM

## 2017-03-04 MED ORDER — TRAMADOL HCL 50 MG PO TABS: 50.0000 mg | ORAL_TABLET | Freq: Four times a day (QID) | ORAL | 0 refills | Status: DC | PRN

## 2017-03-04 MED ORDER — PROMETHAZINE HCL 12.5 MG PO TABS
12.5000 mg | ORAL_TABLET | Freq: Once | ORAL | Status: AC
  Administered 2017-03-04: 12.5 mg via ORAL
  Filled 2017-03-04: qty 1

## 2017-03-04 MED ORDER — PROMETHAZINE HCL 25 MG RE SUPP: 25.0000 mg | Freq: Four times a day (QID) | RECTAL | 0 refills | Status: DC | PRN

## 2017-03-04 MED ORDER — TRAMADOL HCL 50 MG PO TABS
100.0000 mg | ORAL_TABLET | Freq: Once | ORAL | Status: AC
  Administered 2017-03-04: 100 mg via ORAL
  Filled 2017-03-04: qty 2

## 2017-03-04 MED ORDER — DEXAMETHASONE SODIUM PHOSPHATE 4 MG/ML IJ SOLN
8.0000 mg | Freq: Once | INTRAMUSCULAR | Status: AC
  Administered 2017-03-04: 8 mg via INTRAVENOUS
  Filled 2017-03-04: qty 2

## 2017-03-04 MED ORDER — DIPHENHYDRAMINE HCL 50 MG/ML IJ SOLN
25.0000 mg | Freq: Once | INTRAMUSCULAR | Status: AC
  Administered 2017-03-04: 25 mg via INTRAVENOUS
  Filled 2017-03-04: qty 1

## 2017-03-04 MED ORDER — KETOROLAC TROMETHAMINE 30 MG/ML IJ SOLN
30.0000 mg | Freq: Once | INTRAMUSCULAR | Status: AC
  Administered 2017-03-04: 30 mg via INTRAVENOUS
  Filled 2017-03-04: qty 1

## 2017-03-04 MED ORDER — PROCHLORPERAZINE EDISYLATE 5 MG/ML IJ SOLN
5.0000 mg | Freq: Once | INTRAMUSCULAR | Status: AC
  Administered 2017-03-04: 5 mg via INTRAVENOUS
  Filled 2017-03-04: qty 2

## 2017-03-04 NOTE — Discharge Instructions (Signed)
Your vital signs within normal limits. Your oxygen level is 97% on room air. Within normal limits by my interpretation. Your chest x-ray is negative for acute problem. There no acute neurologic deficits related to your headache. Please use promethazine suppository every 6 hours for nausea/vomiting. A use Tylenol and/or ibuprofen every 6 hours for mild pain, may use Ultram for more severe pain. Ultram and promethazine may cause drowsiness. Please do not drive, operate machinery, drink alcohol, or participate in activities requiring concentration when taking this medication. Please discuss your lung pain with Dr.Fusco, as well as the problem you're having with breakthrough headaches.

## 2017-03-04 NOTE — ED Provider Notes (Signed)
[facility] DEPT Provider Note   CSN: [id number] Arrival date & time: 03/04/17  1809     History   Chief Complaint Chief Complaint  Patient presents with  . Migraine    HPI Sharon Gay is a 51 y.o. female.  Patient is a 51 year old female who presents to the emergency department with a complaint of migraine headache, as well as lung pain.  The patient states that over last 3 or 4 days she has been having increasing migraine headache pain. Headache is in the temporal areas and moves to the back of the head. She denies any injury to her head. Does not any changes in her medications, the been no changes in her diet. The patient has not been out of the country recently. She states this feels similar to her previous headache, but at times it has been more severe than others. She's had nausea with it. She has some increase in pain when she is in bright lights. She states that she's been under a great deal of stress.  The patient states that she has COPD, and her lungs have been hurting more than usual. The patient is concerned, she has had problems with pneumonia in the past. She request that this be evaluated as well. She denies hemoptysis. It is of note the patient is a smoker. She denies injury to her chest or lungs. No recent high fevers.      Past Medical History:  Diagnosis Date  . AC (acromioclavicular) joint bone spurs   . Anxiety   . Arthritis   . Back pain   . Chest pain 2003   11/2007: Admission with unstable angina; normal coronary angiography  with [facility]; nl EF  . Chronic pain   . Colitis 2005  . COPD (chronic obstructive pulmonary disease) ([place])    11/2003: Normal PFTs ex minimal increased volumes and decreased the DLCO; Followed in Pulmonary clinic/ [facility]/ [facility]   . Depression   . Gastroesophageal reflux disease 2009   Hiatal hernia; esophageal dilatation for Schatzki's ring in 04/2008; chronic constipation; irritable bowel  syndrome  . History of pneumonia   . Hyperlipidemia 2009   Lipid profile 07/2012:227, 91, 56, 153  . Lung disease 2010  . Migraines   . Mitral valve prolapse   . Mitral valve prolapse   . MS (multiple sclerosis) ([place])   . MS (multiple sclerosis) ([place]) 2001  . 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 PONV (postoperative nausea and vomiting)   . Postmenopausal 10/31/2015  . Smoker 10/31/2015    Patient Active Problem List   Diagnosis Date Noted  . Smoker 10/31/2015  . Postmenopausal 10/31/2015  . History of colonic polyps 10/20/2015  . Dysphagia 10/20/2015  . Hepatomegaly 10/20/2015  . Lymphadenopathy 09/07/2015  . CAP (community acquired pneumonia) 08/25/2015  . Severe sepsis with septic shock ([place]) 08/25/2015  . SIRS (systemic inflammatory response syndrome) ([place]) 08/25/2015  . Abdominal pain 08/09/2015  . 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 History of diagnostic tests 10/06/2012  . Hyperlipidemia   . Mitral valve prolapse   . Anxiety   . COPD (chronic obstructive pulmonary disease) ([place])   . Chest pain   . Gastroesophageal reflux disease   . MIGRAINE HEADACHE 03/30/2009    Past Surgical History:  Procedure Laterality Date  . COLONOSCOPY W/ POLYPECTOMY  2006   Dr. Gala Romney: 3 diminutive polyps in descending colon, unknown path   .  ESOPHAGOGASTRODUODENOSCOPY  2009   Dr. Gala Romney: normal esophagus, small hiatal hernia, s/p empiric dilatilon   . ESOPHAGOGASTRODUODENOSCOPY  2003   Schatzki's ring s/p dilation  . FIBEROPTIC BRONCHOSCOPY  2010  . LIVER BIOPSY    . ORIF RADIAL FRACTURE  2012   Left  . VOCAL CORD POLYP REMOVED  2003    OB History    Gravida Para Term Preterm AB Living   0 0 0 0 0 0   SAB TAB Ectopic Multiple Live Births   0 0 0 0         Home Medications    Prior to Admission medications   Medication Sig Start Date End Date Taking? Authorizing Provider  albuterol (PROVENTIL  HFA;VENTOLIN HFA) 108 (90 BASE) MCG/ACT inhaler Inhale 1-2 puffs into the lungs every 6 (six) hours as needed for wheezing or shortness of breath. 08/25/15   Lily Kocher, PA-C  ALPRAZolam Duanne Moron) 1 MG tablet Take 1 mg by mouth 4 (four) times daily as needed for anxiety.    [provider]  aspirin EC 81 MG tablet Take 81 mg by mouth daily.    [provider]  benzonatate (TESSALON) 100 MG capsule Take 100 mg by mouth 3 (three) times daily. 12/01/15   [provider]  Cholecalciferol (VITAMIN D3) 50000 units CAPS Take 1 capsule by mouth once a week.    [provider]  dexlansoprazole (DEXILANT) 60 MG capsule Take 60 mg by mouth daily.    [provider]  fluticasone (FLONASE) 50 MCG/ACT nasal spray Place 2 sprays into both nostrils 3 (three) times daily.    [provider]  HM NICOTINE 21 MG/24HR patch Apply 21 mg topically. 11/01/15   [provider]  lidocaine (XYLOCAINE) 5 % ointment Apply 1 application topically daily as needed. 11/28/15   [provider]  meloxicam (MOBIC) 7.5 MG tablet Take 7.5 mg by mouth 2 (two) times daily as needed for pain.    [provider]  oxycodone (ROXICODONE) 30 MG immediate release tablet Take 30-60 mg by mouth every 4 (four) hours as needed for pain.    [provider]  oxyCODONE-acetaminophen (PERCOCET) 10-325 MG tablet 1 tablet every 4 (four) hours as needed.  10/20/15   [provider]  PARoxetine (PAXIL) 20 MG tablet Take 20 mg by mouth daily.    [provider]  promethazine (PHENERGAN) 25 MG tablet Take 25 mg by mouth every 6 (six) hours as needed for nausea or vomiting.     [provider]  sodium chloride (OCEAN) 0.65 % SOLN nasal spray Place 1 spray into both nostrils as needed for congestion.    [provider]    Family History Family History  Problem Relation Age of Onset  . Emphysema Maternal Grandfather        was a smoker   . Arthritis Sister   . Mental retardation Sister   . Heart disease Sister        Also brother; cardiomyopathy  . Thyroid disease Sister   . Fibromyalgia Sister   . Arthritis Sister   . Migraines Sister   . COPD Sister   . Heart attack Father 61  . Arthritis Sister   . Migraines Sister   . Other Sister        brain tumor  . Arthritis Brother   . Arthritis Brother   . Colon cancer Neg Hx     Social History Social History  Substance Use Topics  .  Smoking status: Former Smoker    Packs/day: 0.25    Years: 30.00    Types: Cigarettes    Quit date: 12/14/2015  . Smokeless tobacco: Not on file     Comment: smokes 4-5 cig daily  . Alcohol use No     Allergies   Penicillins; Morphine; Rifampin; Topamax [topiramate]; Cymbalta [duloxetine hcl]; and Tequin [gatifloxacin]   Review of Systems Review of Systems  Constitutional: Positive for fatigue. Negative for activity change and appetite change.  HENT: Negative for congestion, ear discharge, ear pain, facial swelling, nosebleeds, rhinorrhea, sneezing and tinnitus.   Eyes: Negative for photophobia, pain and discharge.  Respiratory: Negative for cough, choking, shortness of breath and wheezing.        Lung pain  Cardiovascular: Negative for chest pain, palpitations and leg swelling.  Gastrointestinal: Negative for abdominal pain, blood in stool, constipation, diarrhea, nausea and vomiting.  Endocrine: Positive for cold intolerance.  Genitourinary: Negative for difficulty urinating, dysuria, flank pain, frequency and hematuria.  Musculoskeletal: Negative for back pain, gait problem, myalgias and neck pain.  Skin: Negative for color change, rash and wound.  Neurological: Positive for headaches. Negative for dizziness, seizures, syncope, facial asymmetry, speech difficulty, weakness and numbness.  Hematological: Negative for adenopathy. Does not bruise/bleed easily.  Psychiatric/Behavioral: Negative for agitation, confusion,  hallucinations, self-injury and suicidal ideas. The patient is nervous/anxious.      Physical Exam Updated Vital Signs BP 123/90   Pulse 76   Temp 97.9 F (36.6 C) (Oral)   Resp 18   Ht _0  (1.803 m)   Wt 61.7 kg (136 lb)   SpO2 97%   BMI 18.97 kg/m   Physical Exam  Constitutional: Vital signs are normal. She appears well-developed and well-nourished. She is active.  HENT:  Head: Normocephalic and atraumatic.  Right Ear: Tympanic membrane, external ear and ear canal normal.  Left Ear: Tympanic membrane, external ear and ear canal normal.  Nose: Nose normal.  Mouth/Throat: Uvula is midline, oropharynx is clear and moist and mucous membranes are normal.  Eyes: Conjunctivae, EOM and lids are normal. Pupils are equal, round, and reactive to light.  Neck: Trachea normal, normal range of motion and phonation normal. Neck supple. Carotid bruit is not present.  Cardiovascular: Normal rate, regular rhythm and normal pulses.   Pulmonary/Chest: Effort normal and breath sounds normal. No respiratory distress. She has no wheezes. She has no rales.  Abdominal: Soft. Normal appearance and bowel sounds are normal.  Lymphadenopathy:       Head (right side): No submental, no preauricular and no posterior auricular adenopathy present.       Head (left side): No submental, no preauricular and no posterior auricular adenopathy present.    She has no cervical adenopathy.  Neurological: She is alert. She has normal strength. No cranial nerve deficit or sensory deficit. GCS eye subscore is 4. GCS verbal subscore is 5. GCS motor subscore is 6.  Skin: Skin is warm and dry.  Psychiatric: Her speech is normal.     ED Treatments / Results  Labs (all labs ordered are listed, but only abnormal results are displayed) Labs Reviewed - No data to display  EKG  EKG Interpretation None       Radiology No results found.  Procedures Procedures (including critical care time)  Medications Ordered  in ED Medications - No data to display   Initial Impression / Assessment and Plan / ED Course  I have reviewed the triage vital signs and the  nursing notes.  Pertinent labs & imaging results that were available during my care of the patient were reviewed by me and considered in my medical decision making (see chart for details).      Final Clinical Impressions(s) / ED Diagnoses MDM Vital signs within normal limits. Pulse oximetry is 97% on room air.  Patient was treated with a migraine cocktail. The patient states that the headache is improved but is not gone away.  Chest x-ray is read as negative for acute cardio pulmonary problems.  Recheck. Lungs remain clear. No gross neurologic deficits appreciated. We'll give the patient all from and promethazine as well as IV Decadron, to assist with the patient's headache pain.  Prescription for promethazine suppository, and Ultram given to the patient. Patient is to follow-up with Dr. Gerarda Fraction concerning her headache, and concerning her lung pain. Patient was reassured that her oxygen level was good that her clinical examination revealed good oxygenation. And that her chest x-ray was negative for acute changes at this time.    Final diagnoses:  Nonintractable headache, unspecified chronicity pattern, unspecified headache type    New Prescriptions New Prescriptions   PROMETHAZINE (PHENERGAN) 25 MG SUPPOSITORY    Place 1 suppository (25 mg total) rectally every 6 (six) hours as needed for nausea or vomiting.   TRAMADOL (ULTRAM) 50 MG TABLET    Take 1 tablet (50 mg total) by mouth every 6 (six) hours as needed.     Lily Kocher, PA-C 03/04/17 2140    Milton Ferguson, MD 03/07/17 1018

## 2017-03-04 NOTE — ED Triage Notes (Signed)
C/o Migraine HA for several days, also c/o lung pain, pt states that she has a lung problem.

## 2017-03-15 IMAGING — DX DG CHEST 2V
2 series · 2 of 2 positions shown · non-contrast
Comparison: 08/24/2015

CLINICAL DATA: Fever, headaches, chills, muscle soreness,
productive cough for 2 days. History of COPD and MS. Right chest
pain.

EXAM:
CHEST  2 VIEW

[chest pa]
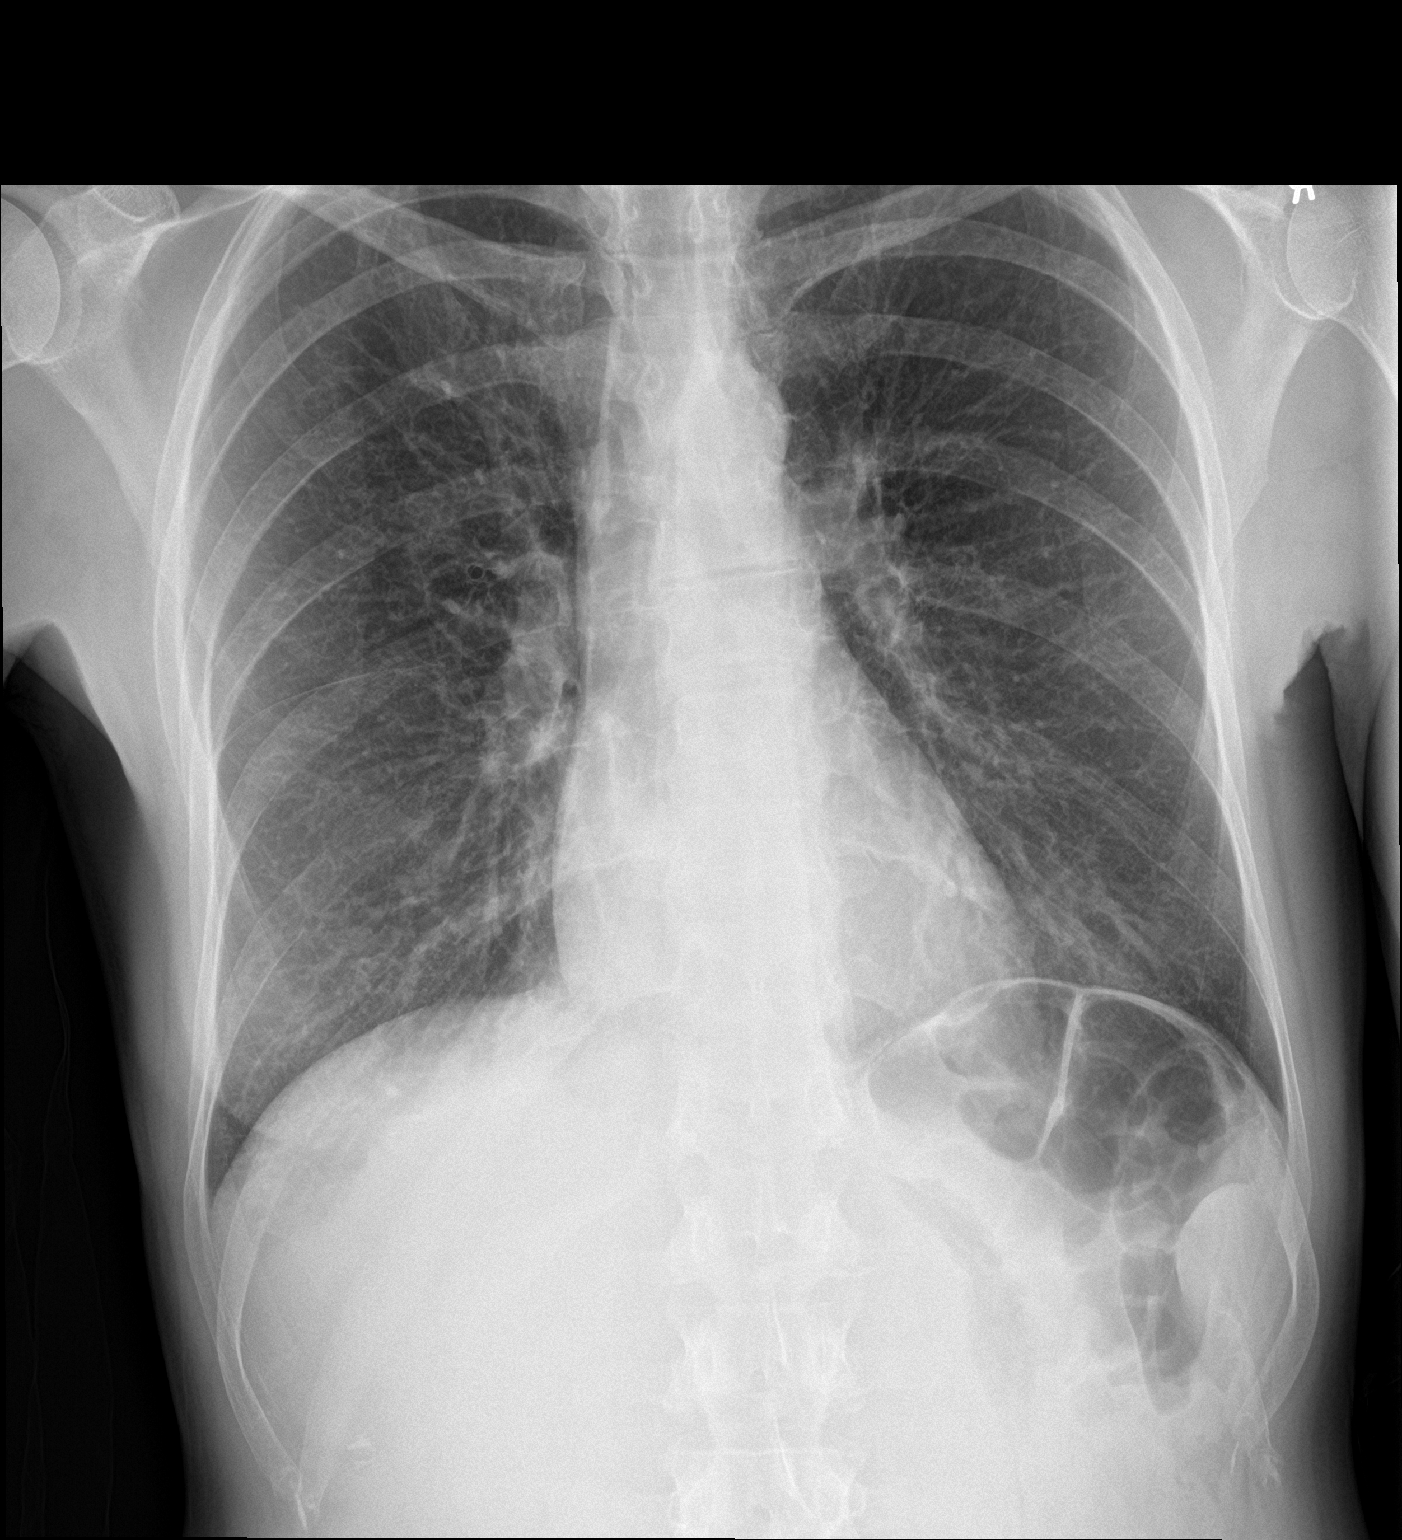

[chest lat]
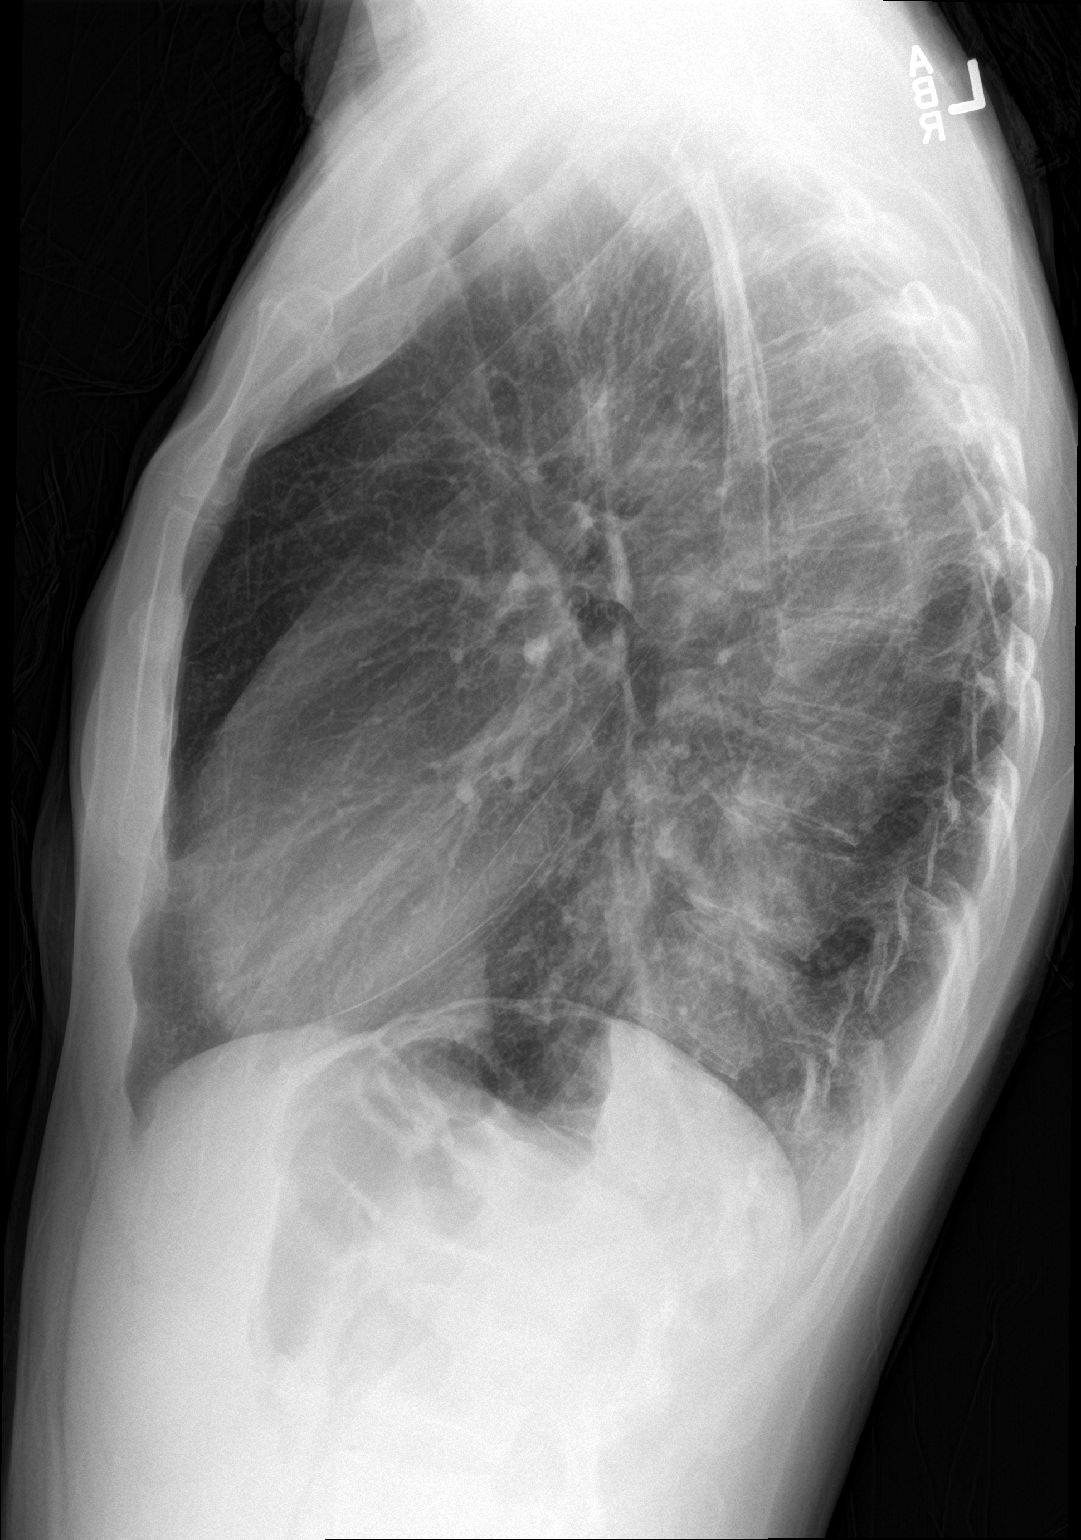

[2 of 2 positions shown; findings below may reference images not displayed]

FINDINGS: Mild hyperinflation. Peribronchial thickening and central
interstitial changes likely due to chronic bronchitis. Normal heart
size and pulmonary vascularity. No focal airspace disease or
consolidation in the lungs. No blunting of costophrenic angles. No
pneumothorax. Mediastinal contours appear intact.
IMPRESSION: Emphysematous changes and chronic bronchitic changes in the chest.
No evidence of active pulmonary disease.

## 2017-09-09 DIAGNOSIS — I639 Cerebral infarction, unspecified: Secondary | ICD-10-CM

## 2017-09-09 HISTORY — DX: Cerebral infarction, unspecified: I63.9

## 2017-11-26 ENCOUNTER — Other Ambulatory Visit (HOSPITAL_COMMUNITY): Payer: Self-pay | Admitting: Internal Medicine

## 2017-11-26 ENCOUNTER — Ambulatory Visit (HOSPITAL_COMMUNITY)
Admission: RE | Admit: 2017-11-26 | Discharge: 2017-11-26 | Disposition: A | Discharge status: Home / Self Care | Payer: Self-pay | Source: Ambulatory Visit | Attending: Internal Medicine | Admitting: Internal Medicine

## 2017-11-26 DIAGNOSIS — R05 Cough: Secondary | ICD-10-CM

## 2017-11-26 DIAGNOSIS — R918 Other nonspecific abnormal finding of lung field: Secondary | ICD-10-CM | POA: Insufficient documentation

## 2017-11-26 DIAGNOSIS — R059 Cough, unspecified: Secondary | ICD-10-CM

## 2017-12-01 ENCOUNTER — Other Ambulatory Visit (HOSPITAL_COMMUNITY)
Admission: RE | Admit: 2017-12-01 | Discharge: 2017-12-01 | Disposition: A | Discharge status: Home / Self Care | Payer: Medicaid Other | Source: Ambulatory Visit | Attending: Internal Medicine | Admitting: Internal Medicine

## 2017-12-01 DIAGNOSIS — R109 Unspecified abdominal pain: Secondary | ICD-10-CM | POA: Insufficient documentation

## 2017-12-01 DIAGNOSIS — R079 Chest pain, unspecified: Secondary | ICD-10-CM | POA: Insufficient documentation

## 2017-12-01 LAB — LIPID PANEL
Cholesterol: 233 mg/dL — ABNORMAL HIGH (ref 0–200)
HDL: 76 mg/dL (ref >40)
LDL Cholesterol: 147 mg/dL — ABNORMAL HIGH (ref 0–99)
Total CHOL/HDL Ratio: 3.1 RATIO
Triglycerides: 52 mg/dL (ref <150)
VLDL: 10 mg/dL (ref 0–40)

## 2017-12-01 LAB — COMPREHENSIVE METABOLIC PANEL
ALK PHOS: 56 U/L (ref 38–126)
ALT: 18 U/L (ref 14–54)
ANION GAP: 8 (ref 5–15)
AST: 26 U/L (ref 15–41)
Albumin: 4.2 g/dL (ref 3.5–5.0)
BILIRUBIN TOTAL: 0.6 mg/dL (ref 0.3–1.2)
BUN: 10 mg/dL (ref 6–20)
CALCIUM: 9.7 mg/dL (ref 8.9–10.3)
CO2: 27 mmol/L (ref 22–32)
CREATININE: 0.82 mg/dL (ref 0.44–1.00)
Chloride: 106 mmol/L (ref 101–111)
GFR calc Af Amer: >60 mL/min (ref >60)
GFR calc non Af Amer: >60 mL/min (ref >60)
Glucose, Bld: 105 mg/dL — ABNORMAL HIGH (ref 65–99)
Potassium: 4.1 mmol/L (ref 3.5–5.1)
Sodium: 141 mmol/L (ref 135–145)
TOTAL PROTEIN: 7.3 g/dL (ref 6.5–8.1)

## 2017-12-01 LAB — CBC WITH DIFFERENTIAL/PLATELET
Basophils Absolute: 0 10*3/uL (ref 0.0–0.1)
Basophils Relative: 1 %
Eosinophils Absolute: 0.3 10*3/uL (ref 0.0–0.7)
Eosinophils Relative: 4 %
HEMATOCRIT: 42.5 % (ref 36.0–46.0)
HEMOGLOBIN: 14.3 g/dL (ref 12.0–15.0)
LYMPHS ABS: 2.3 10*3/uL (ref 0.7–4.0)
Lymphocytes Relative: 31 %
MCH: 31.9 pg (ref 26.0–34.0)
MCHC: 33.6 g/dL (ref 30.0–36.0)
MCV: 94.9 fL (ref 78.0–100.0)
MONOS PCT: 7 %
Monocytes Absolute: 0.5 10*3/uL (ref 0.1–1.0)
NEUTROS ABS: 4.5 10*3/uL (ref 1.7–7.7)
NEUTROS PCT: 57 %
Platelets: 272 10*3/uL (ref 150–400)
RBC: 4.48 MIL/uL (ref 3.87–5.11)
RDW: 13.5 % (ref 11.5–15.5)
WBC: 7.7 10*3/uL (ref 4.0–10.5)

## 2017-12-01 LAB — CK TOTAL AND CKMB (NOT AT ARMC)
CK TOTAL: 259 U/L — AB (ref 38–234)
CK, MB: 5.1 ng/mL — AB (ref 0.5–5.0)
RELATIVE INDEX: 2 (ref 0.0–2.5)

## 2017-12-01 LAB — TSH: TSH: 1.265 u[IU]/mL (ref 0.350–4.500)

## 2017-12-01 LAB — TROPONIN I

## 2017-12-16 ENCOUNTER — Encounter: Payer: Self-pay | Admitting: Cardiology

## 2018-01-12 ENCOUNTER — Other Ambulatory Visit (HOSPITAL_COMMUNITY): Payer: Self-pay | Admitting: Pulmonary Disease

## 2018-01-12 ENCOUNTER — Other Ambulatory Visit (HOSPITAL_COMMUNITY): Payer: Self-pay | Admitting: Respiratory Therapy

## 2018-01-12 DIAGNOSIS — J441 Chronic obstructive pulmonary disease with (acute) exacerbation: Secondary | ICD-10-CM

## 2018-01-12 DIAGNOSIS — J449 Chronic obstructive pulmonary disease, unspecified: Secondary | ICD-10-CM

## 2018-01-20 ENCOUNTER — Other Ambulatory Visit (HOSPITAL_COMMUNITY): Payer: Self-pay | Admitting: Respiratory Therapy

## 2018-01-20 ENCOUNTER — Ambulatory Visit (HOSPITAL_COMMUNITY)
Admission: RE | Admit: 2018-01-20 | Discharge: 2018-01-20 | Disposition: A | Discharge status: Home / Self Care | Payer: Medicaid Other | Source: Ambulatory Visit | Attending: Pulmonary Disease | Admitting: Pulmonary Disease

## 2018-01-20 ENCOUNTER — Encounter (HOSPITAL_COMMUNITY): Payer: Self-pay

## 2018-01-20 DIAGNOSIS — J441 Chronic obstructive pulmonary disease with (acute) exacerbation: Secondary | ICD-10-CM

## 2018-01-20 LAB — PULMONARY FUNCTION TEST
DL/VA % PRED: 69 %
DL/VA: 3.83 ml/min/mmHg/L
DLCO unc % pred: 50 %
DLCO unc: 17.14 ml/min/mmHg
FEF 25-75 Post: 2.15 L/sec
FEF 25-75 Pre: 2 L/sec
FEF2575-%CHANGE-POST: 7 %
FEF2575-%PRED-POST: 69 %
FEF2575-%PRED-PRE: 64 %
FEV1-%Change-Post: 1 %
FEV1-%PRED-POST: 75 %
FEV1-%Pred-Pre: 74 %
FEV1-Post: 2.6 L
FEV1-Pre: 2.56 L
FEV1FVC-%CHANGE-POST: 6 %
FEV1FVC-%PRED-PRE: 92 %
FEV6-%Change-Post: -3 %
FEV6-%PRED-PRE: 79 %
FEV6-%Pred-Post: 76 %
FEV6-Post: 3.27 L
FEV6-Pre: 3.39 L
FEV6FVC-%Change-Post: 0 %
FEV6FVC-%Pred-Post: 102 %
FEV6FVC-%Pred-Pre: 102 %
FVC-%CHANGE-POST: -4 %
FVC-%Pred-Post: 74 %
FVC-%Pred-Pre: 78 %
FVC-Post: 3.28 L
POST FEV1/FVC RATIO: 79 %
PRE FEV1/FVC RATIO: 74 %
PRE FEV6/FVC RATIO: 99 %
Post FEV6/FVC ratio: 100 %
RV % pred: 140 %
RV: 3.04 L
TLC % pred: 106 %
TLC: 6.5 L

## 2018-01-20 MED ORDER — ALBUTEROL SULFATE (2.5 MG/3ML) 0.083% IN NEBU
2.5000 mg | INHALATION_SOLUTION | Freq: Once | RESPIRATORY_TRACT | Status: DC
Start: 2018-01-20 — End: 2018-01-21

## 2018-01-20 MED ORDER — ALBUTEROL SULFATE (2.5 MG/3ML) 0.083% IN NEBU
2.5000 mg | INHALATION_SOLUTION | Freq: Once | RESPIRATORY_TRACT | Status: AC
  Administered 2018-01-20: 2.5 mg via RESPIRATORY_TRACT

## 2018-01-28 ENCOUNTER — Ambulatory Visit (HOSPITAL_COMMUNITY): Admission: RE | Admit: 2018-01-28 | Payer: Medicaid Other | Source: Ambulatory Visit

## 2018-02-16 ENCOUNTER — Ambulatory Visit (HOSPITAL_COMMUNITY): Payer: Medicaid Other

## 2018-03-31 ENCOUNTER — Encounter (HOSPITAL_COMMUNITY): Payer: Self-pay | Admitting: Emergency Medicine

## 2018-03-31 ENCOUNTER — Other Ambulatory Visit: Payer: Self-pay

## 2018-03-31 ENCOUNTER — Emergency Department (HOSPITAL_COMMUNITY): Payer: Self-pay

## 2018-03-31 ENCOUNTER — Inpatient Hospital Stay (HOSPITAL_COMMUNITY): Payer: Self-pay

## 2018-03-31 ENCOUNTER — Observation Stay (HOSPITAL_COMMUNITY)
Admission: EM | Admit: 2018-03-31 | Discharge: 2018-04-01 | Disposition: A | Discharge status: Home / Self Care | Payer: Self-pay | Attending: Internal Medicine | Admitting: Internal Medicine

## 2018-03-31 DIAGNOSIS — G459 Transient cerebral ischemic attack, unspecified: Principal | ICD-10-CM | POA: Diagnosis present

## 2018-03-31 DIAGNOSIS — R2990 Unspecified symptoms and signs involving the nervous system: Secondary | ICD-10-CM

## 2018-03-31 DIAGNOSIS — Z7982 Long term (current) use of aspirin: Secondary | ICD-10-CM | POA: Insufficient documentation

## 2018-03-31 DIAGNOSIS — F419 Anxiety disorder, unspecified: Secondary | ICD-10-CM

## 2018-03-31 DIAGNOSIS — G35 Multiple sclerosis: Secondary | ICD-10-CM

## 2018-03-31 DIAGNOSIS — J4489 Other specified chronic obstructive pulmonary disease: Secondary | ICD-10-CM | POA: Diagnosis present

## 2018-03-31 DIAGNOSIS — I639 Cerebral infarction, unspecified: Secondary | ICD-10-CM

## 2018-03-31 DIAGNOSIS — Z7951 Long term (current) use of inhaled steroids: Secondary | ICD-10-CM | POA: Insufficient documentation

## 2018-03-31 DIAGNOSIS — F17210 Nicotine dependence, cigarettes, uncomplicated: Secondary | ICD-10-CM | POA: Insufficient documentation

## 2018-03-31 DIAGNOSIS — E785 Hyperlipidemia, unspecified: Secondary | ICD-10-CM | POA: Insufficient documentation

## 2018-03-31 DIAGNOSIS — J449 Chronic obstructive pulmonary disease, unspecified: Secondary | ICD-10-CM | POA: Insufficient documentation

## 2018-03-31 DIAGNOSIS — G894 Chronic pain syndrome: Secondary | ICD-10-CM

## 2018-03-31 DIAGNOSIS — G43109 Migraine with aura, not intractable, without status migrainosus: Secondary | ICD-10-CM | POA: Insufficient documentation

## 2018-03-31 DIAGNOSIS — Z8669 Personal history of other diseases of the nervous system and sense organs: Secondary | ICD-10-CM

## 2018-03-31 DIAGNOSIS — F17200 Nicotine dependence, unspecified, uncomplicated: Secondary | ICD-10-CM | POA: Diagnosis present

## 2018-03-31 DIAGNOSIS — Z87891 Personal history of nicotine dependence: Secondary | ICD-10-CM | POA: Diagnosis present

## 2018-03-31 DIAGNOSIS — F329 Major depressive disorder, single episode, unspecified: Secondary | ICD-10-CM | POA: Insufficient documentation

## 2018-03-31 DIAGNOSIS — K219 Gastro-esophageal reflux disease without esophagitis: Secondary | ICD-10-CM

## 2018-03-31 DIAGNOSIS — I10 Essential (primary) hypertension: Secondary | ICD-10-CM | POA: Insufficient documentation

## 2018-03-31 DIAGNOSIS — Z79899 Other long term (current) drug therapy: Secondary | ICD-10-CM | POA: Insufficient documentation

## 2018-03-31 LAB — PROTIME-INR
INR: 1.02
Prothrombin Time: 13.3 seconds (ref 11.4–15.2)

## 2018-03-31 LAB — CBC
HCT: 43.6 % (ref 36.0–46.0)
Hemoglobin: 15.2 g/dL — ABNORMAL HIGH (ref 12.0–15.0)
MCH: 32.9 pg (ref 26.0–34.0)
MCHC: 34.9 g/dL (ref 30.0–36.0)
MCV: 94.4 fL (ref 78.0–100.0)
Platelets: 251 10*3/uL (ref 150–400)
RBC: 4.62 MIL/uL (ref 3.87–5.11)
RDW: 13.2 % (ref 11.5–15.5)
WBC: 6.5 10*3/uL (ref 4.0–10.5)

## 2018-03-31 LAB — COMPREHENSIVE METABOLIC PANEL
ALT: 17 U/L (ref 0–44)
AST: 22 U/L (ref 15–41)
Albumin: 4.4 g/dL (ref 3.5–5.0)
Alkaline Phosphatase: 63 U/L (ref 38–126)
Anion gap: 6 (ref 5–15)
BUN: 13 mg/dL (ref 6–20)
CO2: 28 mmol/L (ref 22–32)
Calcium: 9.3 mg/dL (ref 8.9–10.3)
Chloride: 103 mmol/L (ref 98–111)
Creatinine, Ser: 0.81 mg/dL (ref 0.44–1.00)
GFR calc Af Amer: >60 mL/min (ref >60)
GFR calc non Af Amer: >60 mL/min (ref >60)
Glucose, Bld: 80 mg/dL (ref 70–99)
Potassium: 4.3 mmol/L (ref 3.5–5.1)
Sodium: 137 mmol/L (ref 135–145)
Total Bilirubin: 0.3 mg/dL (ref 0.3–1.2)
Total Protein: 7.3 g/dL (ref 6.5–8.1)

## 2018-03-31 LAB — APTT: aPTT: 33 seconds (ref 24–36)

## 2018-03-31 LAB — DIFFERENTIAL
Basophils Absolute: 0 10*3/uL (ref 0.0–0.1)
Basophils Relative: 1 %
Eosinophils Absolute: 0.3 10*3/uL (ref 0.0–0.7)
Eosinophils Relative: 5 %
Lymphocytes Relative: 39 %
Lymphs Abs: 2.5 10*3/uL (ref 0.7–4.0)
Monocytes Absolute: 0.4 10*3/uL (ref 0.1–1.0)
Monocytes Relative: 6 %
Neutro Abs: 3.3 10*3/uL (ref 1.7–7.7)
Neutrophils Relative %: 49 %

## 2018-03-31 LAB — TROPONIN I: Troponin I: <0.03 ng/mL (ref <0.03)

## 2018-03-31 MED ORDER — STROKE: EARLY STAGES OF RECOVERY BOOK
Freq: Once | Status: AC
  Administered 2018-03-31: 22:00:00
  Filled 2018-03-31: qty 1

## 2018-03-31 MED ORDER — OXYCODONE HCL 5 MG PO TABS
30.0000 mg | ORAL_TABLET | ORAL | Status: DC | PRN
  Administered 2018-03-31: 30 mg via ORAL
  Filled 2018-03-31: qty 6

## 2018-03-31 MED ORDER — VITAMIN D (ERGOCALCIFEROL) 1.25 MG (50000 UNIT) PO CAPS: 50000.0000 [IU] | ORAL_CAPSULE | ORAL | Status: DC

## 2018-03-31 MED ORDER — OXYCODONE HCL 5 MG PO TABS
30.0000 mg | ORAL_TABLET | ORAL | Status: DC | PRN
  Administered 2018-03-31 – 2018-04-01 (×4): 30 mg via ORAL
  Filled 2018-03-31 (×4): qty 6

## 2018-03-31 MED ORDER — PANTOPRAZOLE SODIUM 40 MG PO TBEC
40.0000 mg | DELAYED_RELEASE_TABLET | Freq: Every day | ORAL | Status: DC
  Administered 2018-03-31 – 2018-04-01 (×2): 40 mg via ORAL
  Filled 2018-03-31 (×2): qty 1

## 2018-03-31 MED ORDER — ACETAMINOPHEN 160 MG/5ML PO SOLN: 650.0000 mg | ORAL | Status: DC | PRN

## 2018-03-31 MED ORDER — ENOXAPARIN SODIUM 40 MG/0.4ML ~~LOC~~ SOLN
40.0000 mg | SUBCUTANEOUS | Status: DC
  Administered 2018-03-31: 40 mg via SUBCUTANEOUS
  Filled 2018-03-31: qty 0.4

## 2018-03-31 MED ORDER — NICOTINE 21 MG/24HR TD PT24
21.0000 mg | MEDICATED_PATCH | Freq: Once | TRANSDERMAL | Status: AC
  Administered 2018-03-31: 21 mg via TRANSDERMAL
  Filled 2018-03-31: qty 1

## 2018-03-31 MED ORDER — SENNOSIDES-DOCUSATE SODIUM 8.6-50 MG PO TABS: 1.0000 | ORAL_TABLET | Freq: Every evening | ORAL | Status: DC | PRN

## 2018-03-31 MED ORDER — PAROXETINE HCL 20 MG PO TABS
40.0000 mg | ORAL_TABLET | Freq: Every day | ORAL | Status: DC
  Administered 2018-03-31 – 2018-04-01 (×2): 40 mg via ORAL
  Filled 2018-03-31 (×2): qty 2

## 2018-03-31 MED ORDER — ALPRAZOLAM 0.5 MG PO TABS
1.0000 mg | ORAL_TABLET | Freq: Four times a day (QID) | ORAL | Status: DC | PRN
  Administered 2018-03-31: 1 mg via ORAL
  Filled 2018-03-31: qty 2

## 2018-03-31 MED ORDER — IOPAMIDOL (ISOVUE-370) INJECTION 76%
100.0000 mL | Freq: Once | INTRAVENOUS | Status: AC | PRN
  Administered 2018-03-31: 100 mL via INTRAVENOUS

## 2018-03-31 MED ORDER — KETOROLAC TROMETHAMINE 30 MG/ML IJ SOLN
15.0000 mg | Freq: Once | INTRAMUSCULAR | Status: AC
  Administered 2018-03-31: 15 mg via INTRAVENOUS
  Filled 2018-03-31: qty 1

## 2018-03-31 MED ORDER — ASPIRIN 325 MG PO TABS
325.0000 mg | ORAL_TABLET | Freq: Every day | ORAL | Status: DC
  Administered 2018-04-01: 325 mg via ORAL
  Filled 2018-03-31: qty 1

## 2018-03-31 MED ORDER — ACETAMINOPHEN 650 MG RE SUPP: 650.0000 mg | RECTAL | Status: DC | PRN

## 2018-03-31 MED ORDER — ALBUTEROL SULFATE (2.5 MG/3ML) 0.083% IN NEBU: 3.0000 mL | INHALATION_SOLUTION | Freq: Four times a day (QID) | RESPIRATORY_TRACT | Status: DC | PRN

## 2018-03-31 MED ORDER — BENZONATATE 100 MG PO CAPS: 100.0000 mg | ORAL_CAPSULE | Freq: Three times a day (TID) | ORAL | Status: DC | PRN

## 2018-03-31 MED ORDER — NICOTINE 21 MG/24HR TD PT24
21.0000 mg | MEDICATED_PATCH | Freq: Every day | TRANSDERMAL | Status: DC
  Administered 2018-04-01: 21 mg via TRANSDERMAL
  Filled 2018-03-31: qty 1

## 2018-03-31 MED ORDER — FLUTICASONE PROPIONATE 50 MCG/ACT NA SUSP
2.0000 | Freq: Three times a day (TID) | NASAL | Status: DC
  Administered 2018-03-31 – 2018-04-01 (×3): 2 via NASAL
  Filled 2018-03-31: qty 16

## 2018-03-31 MED ORDER — ASPIRIN 81 MG PO CHEW
324.0000 mg | CHEWABLE_TABLET | Freq: Once | ORAL | Status: DC
Start: 2018-03-31 — End: 2018-04-01

## 2018-03-31 MED ORDER — ACETAMINOPHEN 325 MG PO TABS: 650.0000 mg | ORAL_TABLET | ORAL | Status: DC | PRN

## 2018-03-31 NOTE — H&P (Signed)
History and Physical    Sharon Gay WCB:[id number] DOB: [birth year] DOA: 03/31/2018  PCP: Redmond School, MD  Patient coming from: Home  I have personally briefly reviewed patient's old medical records in [place]  Chief Complaint: facial droop  HPI: Sharon Gay is a 52 y.o. female with medical history significant of hypertension, anxiety ,chronic pain and multiple sclerosis presents with left-sided facial droop.  Last night patient started having left neck pain.  It progressed to include left occipital pain as well.  Today she noticed she had a droop on her left side of her face.  Her sister noticed that she was drooling some and her speech was dysarthric.  Brought her into the ED.  She had a head CT that showed questionable subacute infarct.  Patient does smoke cigarettes for many years.  She is currently using a nicotine patch trying to quit.  She has a history of multiple sclerosis but is not on any medications currently.  Patient did not notice any other weakness but has some mild left-sided weakness as well on exam.  Patient had a CT Angio of her head and neck that was nonacute.  sHe is awaiting evaluation by tele-neurology.  Patient has chronic nausea due to her GERD.  States she has chronic blurry vision is unchanged as well.   ED Course: Discussed case with Dr. Wilson Singer.  Patient was given 4 baby aspirin.  She does take aspirin daily.  Review of Systems: As per HPI otherwise 10 point review of systems negative.  Positive for facial droop positive for drooling and dysarthria.  Positive for chronic shortness of breath chronic nausea chronic pain chronic migraine headaches.  All others reviewed and otherwise negative  Past Medical History:  Diagnosis Date  . AC (acromioclavicular) joint bone spurs   . Anxiety   . Arthritis   . Back pain   . Chest pain 2003   11/2007: Admission with unstable angina; normal coronary angiography  with [facility]; nl EF  .  Chronic pain   . Colitis 2005  . COPD (chronic obstructive pulmonary disease) ([place])    11/2003: Normal PFTs ex minimal increased volumes and decreased the DLCO; Followed in Pulmonary clinic/ [facility]/ [place]   . Depression   . Gastroesophageal reflux disease 2009   Hiatal hernia; esophageal dilatation for Schatzki's ring in 04/2008; chronic constipation; irritable bowel syndrome  . History of pneumonia   . Hyperlipidemia 2009   Lipid profile 07/2012:227, 91, 56, 153  . Lung disease 2010  . Migraines   . Mitral valve prolapse   . Mitral valve prolapse   . MS (multiple sclerosis) ([place])   . MS (multiple sclerosis) ([place]) 2001  . 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 PONV (postoperative nausea and vomiting)   . Postmenopausal 10/31/2015  . Smoker 10/31/2015    Past Surgical History:  Procedure Laterality Date  . COLONOSCOPY W/ POLYPECTOMY  2006   Dr. Gala Romney: 3 diminutive polyps in descending colon, unknown path   . ESOPHAGOGASTRODUODENOSCOPY  2009   Dr. Gala Romney: normal esophagus, small hiatal hernia, s/p empiric dilatilon   . ESOPHAGOGASTRODUODENOSCOPY  2003   Schatzki's ring s/p dilation  . FIBEROPTIC BRONCHOSCOPY  2010  . LIVER BIOPSY    . ORIF RADIAL FRACTURE  2012   Left  . VOCAL CORD POLYP REMOVED  2003     reports that she has been smoking cigarettes.  She has  a 15.00 pack-year smoking history. She has never used smokeless tobacco. She reports that she does not drink alcohol or use drugs.  Allergies  Allergen Reactions  . Penicillins Anaphylaxis, Nausea And Vomiting and Swelling    Has patient had a PCN reaction causing immediate rash, facial/tongue/throat swelling, SOB or lightheadedness with hypotension: No Has patient had a PCN reaction causing severe rash involving mucus membranes or skin necrosis: No Has patient had a PCN reaction that required hospitalization No Has patient had a PCN reaction occurring within the  last 10 years: No If all of the above answers are "NO", then may proceed with Cephalosporin use.   Marland Kitchen Morphine Other (See Comments)    Hallucinations  . Rifampin Nausea Only and Other (See Comments)    Blisters around lips and tongue Discolored orange urine and yellowed skin expected with Rifampin, not adverse reaction  . Topamax [Topiramate] Other (See Comments)    Headache and confusion.  Marland Kitchen Cymbalta [Duloxetine Hcl] Other (See Comments)    Dizziness   . Tequin [Gatifloxacin] Nausea And Vomiting    Family History  Problem Relation Age of Onset  . Emphysema Maternal Grandfather        was a smoker  . Arthritis Sister   . Mental retardation Sister   . Heart disease Sister        Also brother; cardiomyopathy  . Thyroid disease Sister   . Fibromyalgia Sister   . Arthritis Sister   . Migraines Sister   . COPD Sister   . Heart attack Father 4  . Arthritis Sister   . Migraines Sister   . Other Sister        brain tumor  . Arthritis Brother   . Arthritis Brother   . Colon cancer Neg Hx   Family history above reviewed.  States her younger sister did have a mild stroke as well.  Prior to Admission medications   Medication Sig Start Date End Date Taking? Authorizing Provider  albuterol (PROVENTIL HFA;VENTOLIN HFA) 108 (90 BASE) MCG/ACT inhaler Inhale 1-2 puffs into the lungs every 6 (six) hours as needed for wheezing or shortness of breath. 08/25/15  Yes Lily Kocher, PA-C  ALPRAZolam Duanne Moron) 1 MG tablet Take 1 mg by mouth 4 (four) times daily as needed for anxiety.   Yes [provider]  aspirin EC 81 MG tablet Take 81 mg by mouth daily.   Yes [provider]  benzonatate (TESSALON) 100 MG capsule Take 100 mg by mouth 3 (three) times daily as needed for cough.  12/01/15  Yes [provider]  Cholecalciferol (VITAMIN D3) 50000 units CAPS Take 1 capsule by mouth every Monday.    Yes [provider]  dexlansoprazole (DEXILANT) 60 MG capsule Take  60 mg by mouth daily.   Yes [provider]  fluticasone (FLONASE) 50 MCG/ACT nasal spray Place 2 sprays into both nostrils 3 (three) times daily.   Yes [provider]  HM NICOTINE 21 MG/24HR patch Apply 21 mg topically. 11/01/15  Yes [provider]  lidocaine (XYLOCAINE) 5 % ointment Apply 1 application topically daily as needed. 11/28/15  Yes [provider]  meloxicam (MOBIC) 7.5 MG tablet Take 7.5 mg by mouth 2 (two) times daily as needed for pain.   Yes [provider]  Naphazoline-Glycerin (REDNESS RELIEF) 0.012-0.25 % SOLN Apply 1-2 drops to eye daily as needed (for allergies).   Yes [provider]  oxycodone (ROXICODONE) 30 MG immediate release tablet Take 30-60 mg  by mouth every 4 (four) hours as needed for pain.   Yes [provider]  oxyCODONE-acetaminophen (PERCOCET) 10-325 MG tablet 1 tablet every 4 (four) hours as needed.  10/20/15  Yes [provider]  PARoxetine (PAXIL) 40 MG tablet Take 40 mg by mouth daily.    Yes [provider]  promethazine (PHENERGAN) 25 MG tablet Take 25 mg by mouth every 6 (six) hours as needed for nausea or vomiting.   Yes [provider]  Saline (SIMPLY SALINE) 0.9 % AERS Place 1-2 sprays into the nose daily as needed (for nasal congestion).   Yes [provider]    Physical Exam: Vitals:   03/31/18 1808 03/31/18 1831 03/31/18 1835 03/31/18 1900  BP: 132/89  129/82 120/81  Pulse: 66 62 62 (!) 56  Resp: 19 15 (!) 23 (!) 21  Temp:      TempSrc:      SpO2: 100% 100% 100% 100%  Weight:      Height:        Constitutional: NAD, calm, comfortable Vitals:   03/31/18 1808 03/31/18 1831 03/31/18 1835 03/31/18 1900  BP: 132/89  129/82 120/81  Pulse: 66 62 62 (!) 56  Resp: 19 15 (!) 23 (!) 21  Temp:      TempSrc:      SpO2: 100% 100% 100% 100%  Weight:      Height:       Eyes: PERRL, lids and conjunctivae normal ENMT: Mucous membranes are moist.  Posterior pharynx clear of any exudate or lesions.Normal dentition.  Neck: normal, supple, no masses, Respiratory: clear to auscultation bilaterally, no wheezing, no crackles. Normal respiratory effort. No accessory muscle use.  Cardiovascular: Regular rate and rhythm, no murmurs / rubs / gallops. No extremity edema. 2+ pedal pulses. No carotid bruits.  Abdomen: no tenderness, no masses palpated. Bowel sounds positive.  Musculoskeletal: no clubbing / cyanosis. . Good ROM, no contractures. Normal muscle tone.  Skin: no rashes, lesions, ulcers. No induration Neurologic: CN 2-12 grossly intact. Sensation intact, DTR normal. LUE 4+/5 strength, otherwise nml mild left facial droop, tongue midline Psychiatric: Normal judgment and [email or web address] anxious  Alert and oriented x 3.  ( Labs on Admission: I have personally reviewed following labs and imaging studies  CBC: Recent Labs  Lab 03/31/18 1351  WBC 6.5  NEUTROABS 3.3  HGB 15.2*  HCT 43.6  MCV 94.4  PLT 834   Basic Metabolic Panel: Recent Labs  Lab 03/31/18 1351  NA 137  K 4.3  CL 103  CO2 28  GLUCOSE 80  BUN 13  CREATININE 0.81  CALCIUM 9.3   GFR: Estimated Creatinine Clearance: 79.6 mL/min (by C-G formula based on SCr of 0.81 mg/dL). Liver Function Tests: Recent Labs  Lab 03/31/18 1351  AST 22  ALT 17  ALKPHOS 63  BILITOT 0.3  PROT 7.3  ALBUMIN 4.4   No results for input(s): LIPASE, AMYLASE in the last 168 hours. No results for input(s): AMMONIA in the last 168 hours. Coagulation Profile: Recent Labs  Lab 03/31/18 1351  INR 1.02   Cardiac Enzymes: Recent Labs  Lab 03/31/18 1351  TROPONINI <0.03   BNP (last 3 results) No results for input(s): PROBNP in the last 8760 hours. HbA1C: No results for input(s): HGBA1C in the last 72 hours. CBG: No results for input(s): GLUCAP in the last 168 hours. Lipid Profile: No results for input(s): CHOL, HDL, LDLCALC, TRIG, CHOLHDL, LDLDIRECT in the last 72  hours. Thyroid Function Tests: No  results for input(s): TSH, T4TOTAL, FREET4, T3FREE, THYROIDAB in the last 72 hours. Anemia Panel: No results for input(s): VITAMINB12, FOLATE, FERRITIN, TIBC, IRON, RETICCTPCT in the last 72 hours. Urine analysis:    Component Value Date/Time   COLORURINE YELLOW 09/08/2016 1118   APPEARANCEUR CLEAR 09/08/2016 1118   LABSPEC 1.025 09/08/2016 1118   PHURINE 5.5 09/08/2016 [place] 09/08/2016 1118   HGBUR NEGATIVE 09/08/2016 1118   BILIRUBINUR NEGATIVE 09/08/2016 1118   KETONESUR NEGATIVE 09/08/2016 1118   PROTEINUR NEGATIVE 09/08/2016 1118   NITRITE NEGATIVE 09/08/2016 1118   LEUKOCYTESUR NEGATIVE 09/08/2016 1118    Radiological Exams on Admission: Ct Angio Head W Or Wo Contrast  Result Date: 03/31/2018 CLINICAL DATA:  Slurred speech beginning yesterday evening. LEFT posterior headache. History of multiple sclerosis, migraine. EXAM: CT ANGIOGRAPHY HEAD AND NECK TECHNIQUE: Multidetector CT imaging of the head and neck was performed using the standard protocol during bolus administration of intravenous contrast. Multiplanar CT image reconstructions and MIPs were obtained to evaluate the vascular anatomy. Carotid stenosis measurements (when applicable) are obtained utilizing NASCET criteria, using the distal internal carotid diameter as the denominator. CONTRAST:  125m ISOVUE-370 IOPAMIDOL (ISOVUE-370) INJECTION 76% COMPARISON:  CT HEAD March 31, 2018 FINDINGS: CTA NECK FINDINGS: AORTIC ARCH: Normal appearance of the thoracic arch, 2 vessel arch is a normal variant. Mild calcific atherosclerosis aortic arch. The origins of the innominate, left Common carotid artery and subclavian artery are widely patent. RIGHT CAROTID SYSTEM: Common carotid artery is patent. Normal appearance of the carotid bifurcation without hemodynamically significant stenosis by NASCET criteria. Mid cervical segment beaded appearance. LEFT CAROTID SYSTEM: Common carotid artery  is patent. Normal appearance of the carotid bifurcation without hemodynamically significant stenosis by NASCET criteria. Mid cervical segment beaded appearance. VERTEBRAL ARTERIES:Left vertebral artery is dominant. Normal appearance of the vertebral arteries, widely patent. SKELETON: No acute osseous process though bone windows have not been submitted. OTHER NECK: Soft tissues of the neck are nonacute though, not tailored for evaluation. UPPER CHEST: Included lung apices are clear. Mild centrilobular emphysema. No superior mediastinal lymphadenopathy. CTA HEAD FINDINGS: ANTERIOR CIRCULATION: Patent cervical internal carotid arteries, petrous, cavernous and supra clinoid internal carotid arteries. Patent anterior communicating artery. Patent anterior and middle cerebral arteries, mild luminal irregularity compatible with atherosclerosis No large vessel occlusion, significant stenosis, contrast extravasation or aneurysm. POSTERIOR CIRCULATION: Patent vertebrobasilar junction and basilar artery, as well as main branch vessels. RIGHT vertebral artery predominantly terminates in the posterior inferior cerebellar artery with extremely diminutive residual RIGHT V4 segment. Patent posterior cerebral arteries. Bilateral posterior communicating arteries present. Fetal origin RIGHT posterior cerebral artery. No large vessel occlusion, significant stenosis, contrast extravasation or aneurysm. VENOUS SINUSES: Major dural venous sinuses are patent though not tailored for evaluation on this angiographic examination. ANATOMIC VARIANTS: None. DELAYED PHASE: No abnormal intracranial enhancement. MIP images reviewed. IMPRESSION: CTA NECK: 1. No hemodynamically significant stenosis ICA's. Patent vertebral arteries. 2. Bilateral internal carotid artery fibromuscular dysplasia. CTA HEAD: 1. No emergent large vessel occlusion or flow-limiting stenosis. 2. Mild atherosclerosis. Emphysema (ICD10-J43.9).  Aortic Atherosclerosis  (ICD10-I70.0). Electronically Signed   By: CElon AlasM.D.   On: 03/31/2018 17:06   Ct Head Wo Contrast  Result Date: 03/31/2018 CLINICAL DATA:  Left-sided headache, some slurred speech yesterday, smoking history EXAM: CT HEAD WITHOUT CONTRAST TECHNIQUE: Contiguous axial images were obtained from the base of the skull through the vertex without intravenous contrast. COMPARISON:  CT brain scan of 09/05/2008 FINDINGS: Brain: The ventricular system is stable  in size and configuration and the septum is midline in position. The fourth ventricle and basilar cisterns are unremarkable. There is a vague low-attenuation in the right caudate nucleus which could represent a small lacunar infarct of uncertain age. No hemorrhage, mass lesion, or other evidence of acute infarction is seen. Vascular: No vascular abnormality is noted on this unenhanced study. Skull: On bone window images, no calvarial abnormality is seen. Sinuses/Orbits: The paranasal sinuses appear well pneumatized. Other: None. IMPRESSION: 1. Possible subacute lacunar infarct in the right caudate nucleus. 2. No other abnormality on unenhanced CT of the brain. Electronically Signed   By: Ivar Drape M.D.   On: 03/31/2018 15:21   Ct Angio Neck W And/or Wo Contrast  Result Date: 03/31/2018 CLINICAL DATA:  Slurred speech beginning yesterday evening. LEFT posterior headache. History of multiple sclerosis, migraine. EXAM: CT ANGIOGRAPHY HEAD AND NECK TECHNIQUE: Multidetector CT imaging of the head and neck was performed using the standard protocol during bolus administration of intravenous contrast. Multiplanar CT image reconstructions and MIPs were obtained to evaluate the vascular anatomy. Carotid stenosis measurements (when applicable) are obtained utilizing NASCET criteria, using the distal internal carotid diameter as the denominator. CONTRAST:  150m ISOVUE-370 IOPAMIDOL (ISOVUE-370) INJECTION 76% COMPARISON:  CT HEAD March 31, 2018 FINDINGS: CTA  NECK FINDINGS: AORTIC ARCH: Normal appearance of the thoracic arch, 2 vessel arch is a normal variant. Mild calcific atherosclerosis aortic arch. The origins of the innominate, left Common carotid artery and subclavian artery are widely patent. RIGHT CAROTID SYSTEM: Common carotid artery is patent. Normal appearance of the carotid bifurcation without hemodynamically significant stenosis by NASCET criteria. Mid cervical segment beaded appearance. LEFT CAROTID SYSTEM: Common carotid artery is patent. Normal appearance of the carotid bifurcation without hemodynamically significant stenosis by NASCET criteria. Mid cervical segment beaded appearance. VERTEBRAL ARTERIES:Left vertebral artery is dominant. Normal appearance of the vertebral arteries, widely patent. SKELETON: No acute osseous process though bone windows have not been submitted. OTHER NECK: Soft tissues of the neck are nonacute though, not tailored for evaluation. UPPER CHEST: Included lung apices are clear. Mild centrilobular emphysema. No superior mediastinal lymphadenopathy. CTA HEAD FINDINGS: ANTERIOR CIRCULATION: Patent cervical internal carotid arteries, petrous, cavernous and supra clinoid internal carotid arteries. Patent anterior communicating artery. Patent anterior and middle cerebral arteries, mild luminal irregularity compatible with atherosclerosis No large vessel occlusion, significant stenosis, contrast extravasation or aneurysm. POSTERIOR CIRCULATION: Patent vertebrobasilar junction and basilar artery, as well as main branch vessels. RIGHT vertebral artery predominantly terminates in the posterior inferior cerebellar artery with extremely diminutive residual RIGHT V4 segment. Patent posterior cerebral arteries. Bilateral posterior communicating arteries present. Fetal origin RIGHT posterior cerebral artery. No large vessel occlusion, significant stenosis, contrast extravasation or aneurysm. VENOUS SINUSES: Major dural venous sinuses are  patent though not tailored for evaluation on this angiographic examination. ANATOMIC VARIANTS: None. DELAYED PHASE: No abnormal intracranial enhancement. MIP images reviewed. IMPRESSION: CTA NECK: 1. No hemodynamically significant stenosis ICA's. Patent vertebral arteries. 2. Bilateral internal carotid artery fibromuscular dysplasia. CTA HEAD: 1. No emergent large vessel occlusion or flow-limiting stenosis. 2. Mild atherosclerosis. Emphysema (ICD10-J43.9).  Aortic Atherosclerosis (ICD10-I70.0). Electronically Signed   By: CElon AlasM.D.   On: 03/31/2018 17:06    EKG: pending  Assessment/Plan Principal Problem:   Stroke ([facility] probable Active Problems:   Anxiety   COPD (chronic obstructive pulmonary disease) ([facility])   Gastroesophageal reflux disease   Smoker   History of multiple sclerosis  Not on medications Chronic pain   -Inpatient admission to  [facility]. -Antiplatelet therapy with full aspirin , MRI brain with and without contrast , CTA brain and neck arteries completed and not acute, 2D echo ordered.  Monitor on telemetry, fasting lipid panel, consult rehab therapies, dysphagia screen.  Awaiting evaluation by tele-neurology -Cont home medications for anxiety, gerd and chronic pain. -Continue nicotine patch advised cessation     DVT prophylaxis: Lovenox  code Status: Full Disposition Plan: Transfer patient to [facility] , expect discharge to home with therapy 2 to 3 days Admission status: Inpatient telemetry   Ardyn Forge Johnson-Pitts MD [facility] Pager [phone]  If 7PM-7AM, please contact night-coverage [email or web address] Password [facility]  03/31/2018, 7:32 PM

## 2018-03-31 NOTE — ED Notes (Signed)
Report given to Fannie Knee, RN on [facility].

## 2018-03-31 NOTE — ED Notes (Signed)
MD notified pt requesting something for a HA. 8/10 pain.

## 2018-03-31 NOTE — ED Notes (Addendum)
Pt took 1/2 tab of 1Mg  Xanax per MD Koh[facility]'s approval from home medication. Pt also took 4 81mg  ASA tablets from home medication with MD approval.

## 2018-03-31 NOTE — ED Triage Notes (Signed)
Pain in lt side of radiates up to through the back of head, reports having some slurred speech that started yesterday evening at 1730.  Pt is a&o x 4 with no physical deficits at this time.

## 2018-03-31 NOTE — ED Notes (Signed)
Patient transported to CT 

## 2018-03-31 NOTE — ED Provider Notes (Signed)
[facility] EMERGENCY DEPARTMENT Provider Note   CSN: [id number] Arrival date & time: 03/31/18  1322     History   Chief Complaint Chief Complaint  Patient presents with  . Headache    HPI Sharon Gay is a 52 y.o. female.  HPI   52 year old female with some left facial numbness and slurred speech.  She went to sleep around 11 PM yesterday without any deficits.  She woke up around 5:30 AM this morning and noticed that the left side of her face felt numb.  When she was drinking coffee this morning she noted that it was dribbling outside of the left side of her mouth and that when she was speaking with her nephew that her speech sounded somewhat slurred.  Her speech has since improved but she is still having numbness in her face.    She is also complaining some pain in left side of her neck and head.  She first noticed this neck pain yesterday while driving.  It has improved since onset.  She states that it just feels sore.  It is a little bit worse when looking to her left.  No fevers.  No sore throat.  No acute visual changes.  She tells me she has a past history of multiple sclerosis.  Details beyond this are vague.  She reports previous evaluation by neurology but has not had imaging or been evaluated by anybody in several years for this specifically.  She reports no chronic deficits from this and is not any on chronic medications for it specifically.  Past Medical History:  Diagnosis Date  . AC (acromioclavicular) joint bone spurs   . Anxiety   . Arthritis   . Back pain   . Chest pain 2003   11/2007: Admission with unstable angina; normal coronary angiography  with [facility]; nl EF  . Chronic pain   . Colitis 2005  . COPD (chronic obstructive pulmonary disease) ([place])    11/2003: Normal PFTs ex minimal increased volumes and decreased the DLCO; Followed in Pulmonary clinic/ [facility]/ [place]   . Depression   . Gastroesophageal reflux disease 2009    Hiatal hernia; esophageal dilatation for Schatzki's ring in 04/2008; chronic constipation; irritable bowel syndrome  . History of pneumonia   . Hyperlipidemia 2009   Lipid profile 07/2012:227, 91, 56, 153  . Lung disease 2010  . Migraines   . Mitral valve prolapse   . Mitral valve prolapse   . MS (multiple sclerosis) ([place])   . MS (multiple sclerosis) ([place]) 2001  . 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 PONV (postoperative nausea and vomiting)   . Postmenopausal 10/31/2015  . Smoker 10/31/2015    Patient Active Problem List   Diagnosis Date Noted  . Smoker 10/31/2015  . Postmenopausal 10/31/2015  . History of colonic polyps 10/20/2015  . Dysphagia 10/20/2015  . Hepatomegaly 10/20/2015  . Lymphadenopathy 09/07/2015  . CAP (community acquired pneumonia) 08/25/2015  . Severe sepsis with septic shock ([place]) 08/25/2015  . SIRS (systemic inflammatory response syndrome) ([place]) 08/25/2015  . Abdominal pain 08/09/2015  . 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 History of diagnostic tests 10/06/2012  . Hyperlipidemia   . Mitral valve prolapse   . Anxiety   . COPD (chronic obstructive pulmonary disease) ([place])   . Chest pain   . Gastroesophageal reflux disease   . MIGRAINE HEADACHE 03/30/2009    Past Surgical History:  Procedure Laterality Date  . COLONOSCOPY W/ POLYPECTOMY  2006   Dr. Gala Romney: 3 diminutive polyps in descending colon, unknown path   . ESOPHAGOGASTRODUODENOSCOPY  2009   Dr. Gala Romney: normal esophagus, small hiatal hernia, s/p empiric dilatilon   . ESOPHAGOGASTRODUODENOSCOPY  2003   Schatzki's ring s/p dilation  . FIBEROPTIC BRONCHOSCOPY  2010  . LIVER BIOPSY    . ORIF RADIAL FRACTURE  2012   Left  . VOCAL CORD POLYP REMOVED  2003     OB History    Gravida  0   Para  0   Term  0   Preterm  0   AB  0   Living  0     SAB  0   TAB  0   Ectopic  0   Multiple  0   Live Births                Home Medications    Prior to Admission medications   Medication Sig Start Date End Date Taking? Authorizing Provider  albuterol (PROVENTIL HFA;VENTOLIN HFA) 108 (90 BASE) MCG/ACT inhaler Inhale 1-2 puffs into the lungs every 6 (six) hours as needed for wheezing or shortness of breath. 08/25/15   Lily Kocher, PA-C  ALPRAZolam Duanne Moron) 1 MG tablet Take 1 mg by mouth 4 (four) times daily as needed for anxiety.    [provider]  aspirin EC 81 MG tablet Take 81 mg by mouth daily.    [provider]  benzonatate (TESSALON) 100 MG capsule Take 100 mg by mouth 3 (three) times daily. 12/01/15   [provider]  Cholecalciferol (VITAMIN D3) 50000 units CAPS Take 1 capsule by mouth once a week.    [provider]  dexlansoprazole (DEXILANT) 60 MG capsule Take 60 mg by mouth daily.    [provider]  fluticasone (FLONASE) 50 MCG/ACT nasal spray Place 2 sprays into both nostrils 3 (three) times daily.    [provider]  HM NICOTINE 21 MG/24HR patch Apply 21 mg topically. 11/01/15   [provider]  lidocaine (XYLOCAINE) 5 % ointment Apply 1 application topically daily as needed. 11/28/15   [provider]  meloxicam (MOBIC) 7.5 MG tablet Take 7.5 mg by mouth 2 (two) times daily as needed for pain.    [provider]  oxycodone (ROXICODONE) 30 MG immediate release tablet Take 30-60 mg by mouth every 4 (four) hours as needed for pain.    [provider]  oxyCODONE-acetaminophen (PERCOCET) 10-325 MG tablet 1 tablet every 4 (four) hours as needed.  10/20/15   [provider]  PARoxetine (PAXIL) 20 MG tablet Take 20 mg by mouth daily.    [provider]  promethazine (PHENERGAN) 25 MG suppository Place 1 suppository (25 mg total) rectally every 6 (six) hours as needed for nausea or vomiting. 03/04/17   Lily Kocher, PA-C  sodium chloride (OCEAN) 0.65 % SOLN nasal spray Place 1 spray into both  nostrils as needed for congestion.    [provider]  traMADol (ULTRAM) 50 MG tablet Take 1 tablet (50 mg total) by mouth every 6 (six) hours as needed. 03/04/17   Lily Kocher, PA-C    Family History Family History  Problem Relation Age of Onset  . Emphysema Maternal Grandfather        was a smoker  . Arthritis Sister   . Mental retardation Sister   . Heart disease Sister        Also brother;  cardiomyopathy  . Thyroid disease Sister   . Fibromyalgia Sister   . Arthritis Sister   . Migraines Sister   . COPD Sister   . Heart attack Father 75  . Arthritis Sister   . Migraines Sister   . Other Sister        brain tumor  . Arthritis Brother   . Arthritis Brother   . Colon cancer Neg Hx     Social History Social History   Tobacco Use  . Smoking status: Current Every Day Smoker    Packs/day: 0.50    Years: 30.00    Pack years: 15.00    Types: Cigarettes    Last attempt to quit: 12/14/2015    Years since quitting: 2.2  . Smokeless tobacco: Never Used  . Tobacco comment: smokes 4-5 cig daily  Substance Use Topics  . Alcohol use: No  . Drug use: No     Allergies   Penicillins; Morphine; Rifampin; Topamax [topiramate]; Cymbalta [duloxetine hcl]; and Tequin [gatifloxacin]   Review of Systems Review of Systems  All systems reviewed and negative, other than as noted in HPI.  Physical Exam Updated Vital Signs BP 130/85   Pulse (!) 58   Temp (!) 97 F (36.1 C) (Temporal)   Resp 16   Ht _0  (1.803 m)   Wt 62.1 kg (137 lb)   SpO2 97%   BMI 19.11 kg/m   Physical Exam  Constitutional: She is oriented to person, place, and time. She appears well-developed and well-nourished. No distress.  HENT:  Head: Normocephalic and atraumatic.  Eyes: Conjunctivae are normal. Right eye exhibits no discharge. Left eye exhibits no discharge.  Neck: Neck supple.  Cardiovascular: Normal rate, regular rhythm and normal heart sounds. Exam reveals no gallop and no  friction rub.  No murmur heard. Pulmonary/Chest: Effort normal and breath sounds normal. No respiratory distress.  Abdominal: Soft. She exhibits no distension. There is no tenderness.  Musculoskeletal: She exhibits no edema or tenderness.  Neurological: She is alert and oriented to person, place, and time. No cranial nerve deficit.  She is clear.  Content appropriate.  Cranial nerves II through XII are intact bilaterally.  Strength is 4 out of 5 upper extremity.  5 out of 5 all other extremities.  Sensation is intact to light touch.  Skin: Skin is warm and dry.  Psychiatric: She has a normal mood and affect. Her behavior is normal. Thought content normal.  Nursing note and vitals reviewed.    ED Treatments / Results  Labs (all labs ordered are listed, but only abnormal results are displayed) Labs Reviewed  CBC - Abnormal; Notable for the following components:      Result Value   Hemoglobin 15.2 (*)    All other components within normal limits  PROTIME-INR  APTT  DIFFERENTIAL  COMPREHENSIVE METABOLIC PANEL  TROPONIN I    EKG None  Radiology Ct Head Wo Contrast  Result Date: 03/31/2018 CLINICAL DATA:  Left-sided headache, some slurred speech yesterday, smoking history EXAM: CT HEAD WITHOUT CONTRAST TECHNIQUE: Contiguous axial images were obtained from the base of the skull through the vertex without intravenous contrast. COMPARISON:  CT brain scan of 09/05/2008 FINDINGS: Brain: The ventricular system is stable in size and configuration and the septum is midline in position. The fourth ventricle and basilar cisterns are unremarkable. There is a vague low-attenuation in the right caudate nucleus which could represent a small lacunar infarct of uncertain age. No hemorrhage, mass lesion, or other  evidence of acute infarction is seen. Vascular: No vascular abnormality is noted on this unenhanced study. Skull: On bone window images, no calvarial abnormality is seen. Sinuses/Orbits: The  paranasal sinuses appear well pneumatized. Other: None. IMPRESSION: 1. Possible subacute lacunar infarct in the right caudate nucleus. 2. No other abnormality on unenhanced CT of the brain. Electronically Signed   By: Ivar Drape M.D.   On: 03/31/2018 15:21    Procedures Procedures (including critical care time)  Medications Ordered in ED Medications - No data to display   Initial Impression / Assessment and Plan / ED Course  I have reviewed the triage vital signs and the nursing notes.  Pertinent labs & imaging results that were available during my care of the patient were reviewed by me and considered in my medical decision making (see chart for details).     52 year old female with strokelike symptoms.  Initial ED work-up fairly unremarkable.  Will admit for further stroke work-up.  Final Clinical Impressions(s) / ED Diagnoses   Final diagnoses:  Stroke-like symptoms    ED Discharge Orders    None       Virgel Manifold, MD 04/05/18 [id number]

## 2018-04-01 ENCOUNTER — Inpatient Hospital Stay (HOSPITAL_COMMUNITY): Payer: Self-pay

## 2018-04-01 DIAGNOSIS — Z8669 Personal history of other diseases of the nervous system and sense organs: Secondary | ICD-10-CM

## 2018-04-01 DIAGNOSIS — I361 Nonrheumatic tricuspid (valve) insufficiency: Secondary | ICD-10-CM

## 2018-04-01 DIAGNOSIS — J449 Chronic obstructive pulmonary disease, unspecified: Secondary | ICD-10-CM

## 2018-04-01 DIAGNOSIS — G43809 Other migraine, not intractable, without status migrainosus: Secondary | ICD-10-CM

## 2018-04-01 DIAGNOSIS — G459 Transient cerebral ischemic attack, unspecified: Principal | ICD-10-CM | POA: Diagnosis present

## 2018-04-01 DIAGNOSIS — F17200 Nicotine dependence, unspecified, uncomplicated: Secondary | ICD-10-CM

## 2018-04-01 LAB — ECHOCARDIOGRAM COMPLETE
Height: 71 in
WEIGHTICAEL: 2194.02 [oz_av]

## 2018-04-01 MED ORDER — GADOBENATE DIMEGLUMINE 529 MG/ML IV SOLN
10.0000 mL | Freq: Once | INTRAVENOUS | Status: AC | PRN
  Administered 2018-04-01: 10 mL via INTRAVENOUS

## 2018-04-01 MED ORDER — HM NICOTINE 21 MG/24HR TD PT24: 21.0000 mg | MEDICATED_PATCH | Freq: Every day | TRANSDERMAL | 0 refills | Status: AC

## 2018-04-01 MED ORDER — ATORVASTATIN CALCIUM 40 MG PO TABS: 40.0000 mg | ORAL_TABLET | Freq: Every day | ORAL | 11 refills | Status: DC

## 2018-04-01 MED ORDER — SODIUM CHLORIDE 0.9 % IV BOLUS
500.0000 mL | Freq: Once | INTRAVENOUS | Status: AC
  Administered 2018-04-01: 500 mL via INTRAVENOUS

## 2018-04-01 MED ORDER — SODIUM CHLORIDE 0.9 % IV BOLUS: 1000.0000 mL | Freq: Once | INTRAVENOUS | Status: DC

## 2018-04-01 NOTE — Consult Note (Addendum)
Neurology Consultation  Reason for Consult: Numbness in left arm which is baseline along with occipital pain and headache on the left Referring Physician: [facility]   History is obtained from: Patient  HPI: Sharon Gay is a 52 y.o. female with history of tobacco abuse, multiple sclerosis, dysphasia, hyperlipidemia, mitral valve prolapse, anxiety, migraine headaches.  Patient states that on Monday she noted some neck pain on the left side of her neck which then by Tuesday turned into pain that tracked up the left occipital portion of her head with throbbing sensation.  Patient also noted off and on numbness of her left arm but states that this is normal for her secondary to her MS she was more concerned of the abnormalities in her neck and head.  At this point she says the majority of her symptoms have significantly improved.  She denies any photophobia, phonophobia, blurred vision, weakness, and numbness in any other aspect of her body but does state she had some numbness on the corner of her mouth on the left.  Patient first seen at [facility] and had CT head which was read as possible subacute infarct in  R caudate nucleus. She did have a CTA of the head and neck.  This showed no hemodynamically significant stenosis of the ICAs with patent vertebral arteries.  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     MRI of the brain done at [facility] with and without contrast showed a normal brain MRI with no evidence of chronic demyelination.  Patient states that she is followed by Dr. Merlene Laughter for her MS.  I have looked through chart review and I cannot find any notes from Dr. Merlene Laughter.  It is also notable that she is not on any immune modulating drugs for her MS.  LKW: Monday  ROS: A 14 point ROS was performed and is negative except as noted in the HPI.  Past Medical History:  Diagnosis Date  . AC (acromioclavicular) joint bone spurs   . Anxiety   . Arthritis   .  Back pain   . Chest pain 2003   11/2007: Admission with unstable angina; normal coronary angiography  with [facility]; nl EF  . Chronic pain   . Colitis 2005  . COPD (chronic obstructive pulmonary disease) ([place])    11/2003: Normal PFTs ex minimal increased volumes and decreased the DLCO; Followed in Pulmonary clinic/ [facility]/ [place]   . Depression   . Gastroesophageal reflux disease 2009   Hiatal hernia; esophageal dilatation for Schatzki's ring in 04/2008; chronic constipation; irritable bowel syndrome  . History of pneumonia   . Hyperlipidemia 2009   Lipid profile 07/2012:227, 91, 56, 153  . Lung disease 2010  . Migraines   . Mitral valve prolapse   . Mitral valve prolapse   . MS (multiple sclerosis) ([place])   . MS (multiple sclerosis) ([place]) 2001  . 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 PONV (postoperative nausea and vomiting)   . Postmenopausal 10/31/2015  . Smoker 10/31/2015     Family History  Problem Relation Age of Onset  . Emphysema Maternal Grandfather        was a smoker  . Arthritis Sister   . Mental retardation Sister   . Heart disease Sister        Also brother; cardiomyopathy  . Thyroid disease Sister   . Fibromyalgia Sister   . Arthritis Sister   .  Migraines Sister   . COPD Sister   . Heart attack Father 14  . Arthritis Sister   . Migraines Sister   . Other Sister        brain tumor  . Arthritis Brother   . Arthritis Brother   . Colon cancer Neg Hx     Social History:   reports that she has been smoking cigarettes.  She has a 15.00 pack-year smoking history. She has never used smokeless tobacco. She reports that she does not drink alcohol or use drugs.  Medications  Current Facility-Administered Medications:  .  acetaminophen (TYLENOL) tablet 650 mg, 650 mg, Oral, Q4H PRN **OR** acetaminophen (TYLENOL) solution 650 mg, 650 mg, Per Tube, Q4H PRN **OR** acetaminophen (TYLENOL)  suppository 650 mg, 650 mg, Rectal, Q4H PRN, Johnson-Pitts, Endia, MD .  albuterol (PROVENTIL) (2.5 MG/3ML) 0.083% nebulizer solution 3 mL, 3 mL, Inhalation, Q6H PRN, Johnson-Pitts, Endia, MD .  ALPRAZolam Duanne Moron) tablet 1 mg, 1 mg, Oral, QID PRN, Johnson-Pitts, Endia, MD, 1 mg at 03/31/18 2230 .  aspirin chewable tablet 324 mg, 324 mg, Oral, Once, Virgel Manifold, MD .  aspirin tablet 325 mg, 325 mg, Oral, Daily, Johnson-Pitts, Endia, MD, 325 mg at 04/01/18 0841 .  benzonatate (TESSALON) capsule 100 mg, 100 mg, Oral, TID PRN, Johnson-Pitts, Endia, MD .  enoxaparin (LOVENOX) injection 40 mg, 40 mg, Subcutaneous, Q24H, Johnson-Pitts, Endia, MD, 40 mg at 03/31/18 2219 .  fluticasone (FLONASE) 50 MCG/ACT nasal spray 2 spray, 2 spray, Each Nare, TID, Johnson-Pitts, Endia, MD, 2 spray at 04/01/18 0842 .  nicotine (NICODERM CQ - dosed in mg/24 hours) patch 21 mg, 21 mg, Transdermal, Once, Virgel Manifold, MD, 21 mg at 03/31/18 1735 .  nicotine (NICODERM CQ - dosed in mg/24 hours) patch 21 mg, 21 mg, Transdermal, Daily, Johnson-Pitts, Endia, MD, 21 mg at 04/01/18 0841 .  oxyCODONE (Oxy IR/ROXICODONE) immediate release tablet 30 mg, 30 mg, Oral, Q4H PRN, Johnson-Pitts, Endia, MD, 30 mg at 04/01/18 1145 .  pantoprazole (PROTONIX) EC tablet 40 mg, 40 mg, Oral, Daily, Johnson-Pitts, Endia, MD, 40 mg at 04/01/18 0841 .  PARoxetine (PAXIL) tablet 40 mg, 40 mg, Oral, Daily, Johnson-Pitts, Endia, MD, 40 mg at 04/01/18 0838 .  senna-docusate (Senokot-S) tablet 1 tablet, 1 tablet, Oral, QHS PRN, Johnson-Pitts, Endia, MD .  sodium chloride 0.9 % bolus 1,000 mL, 1,000 mL, Intravenous, Once, Kirby-Graham, Karsten Fells, NP .  Derrill Memo ON 04/06/2018] Vitamin D (Ergocalciferol) (DRISDOL) capsule 50,000 Units, 50,000 Units, Oral, Q Mon, Johnson-Pitts, Endia, MD   Exam: Current vital signs: BP 125/72 (BP Location: Right Arm)   Pulse (!) 52   Temp 98.2 F (36.8 C) (Oral)   Resp 20   Ht _0  (1.803 m)   Wt 62.2 kg (137 lb 2  oz)   SpO2 99%   BMI 19.13 kg/m  Vital signs in last 24 hours: Temp:  [97 F (36.1 C)-98.4 F (36.9 C)] 98.2 F (36.8 C) (07/24 0857) Pulse Rate:  [43-69] 52 (07/24 0857) Resp:  [12-23] 20 (07/24 0857) BP: (88-150)/(49-89) 125/72 (07/24 0857) SpO2:  [96 %-100 %] 99 % (07/24 0857) Weight:  [62.1 kg (137 lb)-62.2 kg (137 lb 2 oz)] 62.2 kg (137 lb 2 oz) (07/23 2142)  GENERAL: Awake, alert in NAD HEENT: - Normocephalic and atraumatic,  LUNGS - Clear to auscultation bilaterally with no wheezes CV - S1S2 RRR,  ABDOMEN - Soft, nontender, nondistended with normoactive BS Ext: warm, well perfused, intact peripheral pulses,  NEURO:  Mental Status:  AA&Ox3  Language: speech is clear.  Naming, repetition, fluency, and comprehension intact. Cranial Nerves: PERRL 2 mm/brisk. EOMI, visual fields full, no facial asymmetry, facial sensation intact, hearing intact, tongue/uvula/soft palate midline, normal Motor: 5/5 throughout and I did note some give way strength on the left arm Tone: is normal and bulk is normal Sensation- Intact to light touch bilaterally Coordination: FTN intact bilaterally, no ataxia in BLE. Gait- deferred   Labs I have reviewed labs in epic and the results pertinent to this consultation are:   CBC    Component Value Date/Time   WBC 6.5 03/31/2018 1351   RBC 4.62 03/31/2018 1351   HGB 15.2 (H) 03/31/2018 1351   HCT 43.6 03/31/2018 1351   PLT 251 03/31/2018 1351   MCV 94.4 03/31/2018 1351   MCH 32.9 03/31/2018 1351   MCHC 34.9 03/31/2018 1351   RDW 13.2 03/31/2018 1351   LYMPHSABS 2.5 03/31/2018 1351   MONOABS 0.4 03/31/2018 1351   EOSABS 0.3 03/31/2018 1351   BASOSABS 0.0 03/31/2018 1351    CMP     Component Value Date/Time   NA 137 03/31/2018 1351   K 4.3 03/31/2018 1351   CL 103 03/31/2018 1351   CO2 28 03/31/2018 1351   GLUCOSE 80 03/31/2018 1351   BUN 13 03/31/2018 1351   CREATININE 0.81 03/31/2018 1351   CREATININE 0.72 09/15/2015 0939    CALCIUM 9.3 03/31/2018 1351   PROT 7.3 03/31/2018 1351   ALBUMIN 4.4 03/31/2018 1351   AST 22 03/31/2018 1351   ALT 17 03/31/2018 1351   ALKPHOS 63 03/31/2018 1351   BILITOT 0.3 03/31/2018 1351   GFRNONAA >60 03/31/2018 1351   GFRNONAA >89 09/15/2015 0939   GFRAA >60 03/31/2018 1351   GFRAA >89 09/15/2015 0939    Lipid Panel     Component Value Date/Time   CHOL 233 (H) 12/01/2017 1544   TRIG 52 12/01/2017 1544   HDL 76 12/01/2017 1544   CHOLHDL 3.1 12/01/2017 1544   VLDL 10 12/01/2017 1544   LDLCALC 147 (H) 12/01/2017 1544     Imaging I have reviewed the images obtained:  CT-scan of the brain--showed possible subacute lacunar infarct in the right caudate nucleus however this was followed up by MRI which did not show any infarct  MRI with and without contrast examination of the brain--- MRI of the brain with and without contrast showed a normal brain MRI with no evidence of chronic demyelination.  CTA of head neck-- no hemodynamically significant stenosis of the ICAs with patent vertebral arteries.  It did show bilateral internal carotid artery of fibromuscular dysplasia.  CTA of head showed no emergent large vessel occlusion or flow-limiting stenosis.     NEUROHOSPITALIST ADDENDUM Seen and examined the patient today. I have reviewed the contents of history and physical exam as documented by PA/ARNP/Resident and agree with above documentation.  I have discussed and formulated the above plan as documented. Edits to the note have been made as needed.     Assessment: 52 year old female with history of MS and migraines presenting to the hospital secondary to new onset of left neck pain and left occipital pain associated with left corner of mouth decreased sensation which has now resolved. Her CT head showed possible subacute infarct in Right caudate nucleus. however MRI Aaron Edelman performed confirms no stroke. Likely perivascular space noted on CT. Also, MRI brian did not demonstrate  any new or chronic T2 lesions- making the diagnosis of MS  All ancillary tests did not  show any abnormality including MRI which did not show any history of plaques to indicate MS.    Impression: Complicated migraine  Plan As patient not having any more headache, feels better - no need for migraine cocktail.  # Reassured she is not having a stroke.  # Recommend patient follow-up with Dr. Merlene Laughter her primary neurologist for migraines and possible MS. Please schedule referral.   Karena Addison Evin Chirco MD [facility] [phone]   If 7pm to 7am, please call on call as listed on AMION.

## 2018-04-01 NOTE — Plan of Care (Signed)
  Problem: Education: Goal: Knowledge of General Education information will improve Description Including pain rating scale, medication(s)/side effects and non-pharmacologic comfort measures Outcome: Adequate for Discharge   Problem: Health Behavior/Discharge Planning: Goal: Ability to manage health-related needs will improve Outcome: Adequate for Discharge   Problem: Clinical Measurements: Goal: Ability to maintain clinical measurements within normal limits will improve Outcome: Adequate for Discharge Goal: Will remain free from infection Outcome: Adequate for Discharge Goal: Diagnostic test results will improve Outcome: Adequate for Discharge Goal: Respiratory complications will improve Outcome: Adequate for Discharge Goal: Cardiovascular complication will be avoided Outcome: Adequate for Discharge   Problem: Activity: Goal: Risk for activity intolerance will decrease Outcome: Adequate for Discharge   Problem: Nutrition: Goal: Adequate nutrition will be maintained Outcome: Adequate for Discharge   Problem: Coping: Goal: Level of anxiety will decrease Outcome: Adequate for Discharge   Problem: Elimination: Goal: Will not experience complications related to bowel motility Outcome: Adequate for Discharge Goal: Will not experience complications related to urinary retention Outcome: Adequate for Discharge   Problem: Pain Managment: Goal: General experience of comfort will improve Outcome: Adequate for Discharge   Problem: Safety: Goal: Ability to remain free from injury will improve Outcome: Adequate for Discharge   Problem: Skin Integrity: Goal: Risk for impaired skin integrity will decrease Outcome: Adequate for Discharge   Problem: Education: Goal: Knowledge of disease or condition will improve Outcome: Adequate for Discharge Goal: Knowledge of secondary prevention will improve Outcome: Adequate for Discharge Goal: Knowledge of patient specific risk factors  addressed and post discharge goals established will improve Outcome: Adequate for Discharge Goal: Individualized Educational Video(s) Outcome: Adequate for Discharge   Problem: Coping: Goal: Will verbalize positive feelings about self Outcome: Adequate for Discharge Goal: Will identify appropriate support needs Outcome: Adequate for Discharge   Problem: Self-Care: Goal: Ability to participate in self-care as condition permits will improve Outcome: Adequate for Discharge Goal: Verbalization of feelings and concerns over difficulty with self-care will improve Outcome: Adequate for Discharge Goal: Ability to communicate needs accurately will improve Outcome: Adequate for Discharge   

## 2018-04-01 NOTE — Progress Notes (Signed)
Discharge instructions reviewed and prescription given to patient. Reviewed s/s CVA and she denies questions. Patient states she is able to driver herself home w sister following her and Dr [name] agreed this is ok. Patient discharged to self and ambulatory in stable condition.

## 2018-04-01 NOTE — Progress Notes (Signed)
PT Cancellation Note  Patient Details Name: LINNETTE HOLMEN MRN: [id number] DOB: [birth year]   Cancelled Treatment:    Reason Eval/Treat Not Completed: Patient at procedure or test/unavailable At Echo. Will follow-up as time allows.   Kipp Laurence, PT, DPT 04/01/18 9:58 AM Pager: [phone]

## 2018-04-01 NOTE — Discharge Instructions (Signed)
Follow with Elfredia Nevins, MD in 2-3 weeks  Please get a complete blood count and chemistry panel checked by your Primary MD at your next visit, and again as instructed by your Primary MD. Please get your medications reviewed and adjusted by your Primary MD.  Please request your Primary MD to go over all Hospital Tests and Procedure/Radiological results at the follow up, please get all Hospital records sent to your Prim MD by signing hospital release before you go home.  If you had Pneumonia of Lung problems at the Hospital: Please get a 2 view Chest X ray done in 6-8 weeks after hospital discharge or sooner if instructed by your Primary MD.  If you have Congestive Heart Failure: Please call your Cardiologist or Primary MD anytime you have any of the following symptoms:  1) 3 pound weight gain in 24 hours or 5 pounds in 1 week  2) shortness of breath, with or without a dry hacking cough  3) swelling in the hands, feet or stomach  4) if you have to sleep on extra pillows at night in order to breathe  Follow cardiac low salt diet and 1.5 lit/day fluid restriction.  If you have diabetes Accuchecks 4 times/day, Once in AM empty stomach and then before each meal. Log in all results and show them to your primary doctor at your next visit. If any glucose reading is under 80 or above 300 call your primary MD immediately.  If you have Seizure/Convulsions/Epilepsy: Please do not drive, operate heavy machinery, participate in activities at heights or participate in high speed sports until you have seen by Primary MD or a Neurologist and advised to do so again.  If you had Gastrointestinal Bleeding: Please ask your Primary MD to check a complete blood count within one week of discharge or at your next visit. Your endoscopic/colonoscopic biopsies that are pending at the time of discharge, will also need to followed by your Primary MD.  Get Medicines reviewed and adjusted. Please take all your  medications with you for your next visit with your Primary MD  Please request your Primary MD to go over all hospital tests and procedure/radiological results at the follow up, please ask your Primary MD to get all Hospital records sent to his/her office.  If you experience worsening of your admission symptoms, develop shortness of breath, life threatening emergency, suicidal or homicidal thoughts you must seek medical attention immediately by calling 911 or calling your MD immediately  if symptoms less severe.  You must read complete instructions/literature along with all the possible adverse reactions/side effects for all the Medicines you take and that have been prescribed to you. Take any new Medicines after you have completely understood and accpet all the possible adverse reactions/side effects.   Do not drive or operate heavy machinery when taking Pain medications.   Do not take more than prescribed Pain, Sleep and Anxiety Medications  Special Instructions: If you have smoked or chewed Tobacco  in the last 2 yrs please stop smoking, stop any regular Alcohol  and or any Recreational drug use.  Wear Seat belts while driving.  Please note You were cared for by a hospitalist during your hospital stay. If you have any questions about your discharge medications or the care you received while you were in the hospital after you are discharged, you can call the unit and asked to speak with the hospitalist on call if the hospitalist that took care of you is not available. Once  you are discharged, your primary care physician will handle any further medical issues. Please note that NO REFILLS for any discharge medications will be authorized once you are discharged, as it is imperative that you return to your primary care physician (or establish a relationship with a primary care physician if you do not have one) for your aftercare needs so that they can reassess your need for medications and monitor your  lab values.  You can reach the hospitalist office at phone [phone] or fax [phone]   If you do not have a primary care physician, you can call [phone] for a physician referral.  Activity: As tolerated with Full fall precautions use walker/cane & assistance as needed  Diet: heart healthy  Disposition Home

## 2018-04-01 NOTE — Progress Notes (Signed)
  Echocardiogram 2D Echocardiogram has been performed.  Delcie Roch 04/01/2018, 10:35 AM

## 2018-04-01 NOTE — Discharge Summary (Signed)
Physician Discharge Summary  Sharon Gay BMW:[id number] DOB: 02/08/1966 DOA: 03/31/2018  PCP: Elfredia Nevins, MD  Admit date: 03/31/2018 Discharge date: 04/01/2018  Admitted From: home Disposition:  Home   Recommendations for Outpatient Follow-up:  1. Follow up with PCP in 1-2 weeks 2. Follow up with Dr. Gerilyn Pilgrim in 3-4 weeks  Home Health: none Equipment/Devices: none  Discharge Condition: stable CODE STATUS: Full code Diet recommendation: heart healthy  HPI: Per Dr. Thayer Dallas Sharon Gay is a 52 y.o. female with medical history significant of hypertension, anxiety ,chronic pain and multiple sclerosis presents with left-sided facial droop.  Last night patient started having left neck pain.  It progressed to include left occipital pain as well.  Today she noticed she had a droop on her left side of her face.  Her sister noticed that she was drooling some and her speech was dysarthric.  Brought her into the ED.  She had a head CT that showed questionable subacute infarct.  Patient does smoke cigarettes for many years.  She is currently using a nicotine patch trying to quit.  She has a history of multiple sclerosis but is not on any medications currently.  Patient did not notice any other weakness but has some mild left-sided weakness as well on exam.  Patient had a CT Angio of her head and neck that was nonacute.  sHe is awaiting evaluation by tele-neurology.  Patient has chronic nausea due to her GERD.  States she has chronic blurry vision is unchanged as well.  Hospital Course: TIA -patient complaining of left-sided neck pain as well as left-sided weakness and facial droop.  MRI of the brain was negative.  Neurology was consulted and followed patient while hospitalized.  She underwent a 2D echo which showed normal EF 55 to 60%, without WMA.  Lipid panel was significant for total cholesterol of 233 as well as an LDL of 147.  She was placed on statin.  She was still having some  left neck pain as well as left-sided weakness, however able to ambulate without difficulties in the hallway, discussed with neurology and they feel like this may represent complicated migraine which is improving.  She was stable for home discharge, and was recommended to have follow-up with primary neurologist in few weeks. MS -unclear when this was diagnosed, MRI of the brain without demyelinating lesions Tobacco abuse -recommend cessation.  Chest x-ray suggest COPD, seeing Dr. Juanetta Gosling in [place].  Respiratory status stable, she has no wheezing.  Nicotine patch prescribed on discharge HLD -started on statin   Discharge Diagnoses:  Principal Problem:   Stroke [facility]) probable Active Problems:   Anxiety   COPD (chronic obstructive pulmonary disease) (HCC)   Gastroesophageal reflux disease   Smoker   History of multiple sclerosis   TIA (transient ischemic attack)     Discharge Instructions   Allergies as of 04/01/2018      Reactions   Penicillins Anaphylaxis, Nausea And Vomiting, Swelling   Has patient had a PCN reaction causing immediate rash, facial/tongue/throat swelling, SOB or lightheadedness with hypotension: No Has patient had a PCN reaction causing severe rash involving mucus membranes or skin necrosis: No Has patient had a PCN reaction that required hospitalization No Has patient had a PCN reaction occurring within the last 10 years: No If all of the above answers are "NO", then may proceed with Cephalosporin use.   Morphine Other (See Comments)   Hallucinations   Rifampin Nausea Only, Other (See Comments)   Blisters  around lips and tongue Discolored orange urine and yellowed skin expected with Rifampin, not adverse reaction   Topamax [topiramate] Other (See Comments)   Headache and confusion.   Cymbalta [duloxetine Hcl] Other (See Comments)   Dizziness   Tequin [gatifloxacin] Nausea And Vomiting      Medication List    TAKE these medications   albuterol 108 (90  Base) MCG/ACT inhaler Commonly known as:  PROVENTIL HFA;VENTOLIN HFA Inhale 1-2 puffs into the lungs every 6 (six) hours as needed for wheezing or shortness of breath.   ALPRAZolam 1 MG tablet Commonly known as:  XANAX Take 1 mg by mouth 4 (four) times daily as needed for anxiety.   aspirin EC 81 MG tablet Take 81 mg by mouth daily.   atorvastatin 40 MG tablet Commonly known as:  LIPITOR Take 1 tablet (40 mg total) by mouth daily.   benzonatate 100 MG capsule Commonly known as:  TESSALON Take 100 mg by mouth 3 (three) times daily as needed for cough.   dexlansoprazole 60 MG capsule Commonly known as:  DEXILANT Take 60 mg by mouth daily.   fluticasone 50 MCG/ACT nasal spray Commonly known as:  FLONASE Place 2 sprays into both nostrils 3 (three) times daily.   HM NICOTINE 21 mg/24hr patch Generic drug:  nicotine Place 1 patch (21 mg total) onto the skin daily for 28 days. What changed:    how to take this  when to take this   lidocaine 5 % ointment Commonly known as:  XYLOCAINE Apply 1 application topically daily as needed.   meloxicam 7.5 MG tablet Commonly known as:  MOBIC Take 7.5 mg by mouth 2 (two) times daily as needed for pain.   oxycodone 30 MG immediate release tablet Commonly known as:  ROXICODONE Take 30-60 mg by mouth every 4 (four) hours as needed for pain.   oxyCODONE-acetaminophen 10-325 MG tablet Commonly known as:  PERCOCET 1 tablet every 4 (four) hours as needed.   PARoxetine 40 MG tablet Commonly known as:  PAXIL Take 40 mg by mouth daily.   promethazine 25 MG tablet Commonly known as:  PHENERGAN Take 25 mg by mouth every 6 (six) hours as needed for nausea or vomiting.   REDNESS RELIEF 0.012-0.25 % Soln Generic drug:  Naphazoline-Glycerin Apply 1-2 drops to eye daily as needed (for allergies).   SIMPLY SALINE 0.9 % Aers Generic drug:  Saline Place 1-2 sprays into the nose daily as needed (for nasal congestion).   Vitamin D3 50000  units Caps Take 1 capsule by mouth every Monday.      Follow-up Information    Elfredia Nevins, MD. Schedule an appointment as soon as possible for a visit in 3 week(s).   Specialty:  Internal Medicine Contact information: [address] [place] [address] [phone]        Beryle Beams, MD. Schedule an appointment as soon as possible for a visit in 3 week(s).   Specialty:  Neurology Contact information: [address] [place] [address] [phone]           Consultations:  Neurology   Procedures/Studies:  2D echo  Study Conclusions - Left ventricle: The cavity size was normal. Systolic function was normal. The estimated ejection fraction was in the range of 55% to 60%. Wall motion was normal; there were no regional wall motion abnormalities. Left ventricular diastolic function parameters were normal  Ct Angio Head W Or Wo Contrast  Result Date: 03/31/2018 CLINICAL DATA:  Slurred speech  beginning yesterday evening. LEFT posterior headache. History of multiple sclerosis, migraine. EXAM: CT ANGIOGRAPHY HEAD AND NECK TECHNIQUE: Multidetector CT imaging of the head and neck was performed using the standard protocol during bolus administration of intravenous contrast. Multiplanar CT image reconstructions and MIPs were obtained to evaluate the vascular anatomy. Carotid stenosis measurements (when applicable) are obtained utilizing NASCET criteria, using the distal internal carotid diameter as the denominator. CONTRAST:  ISOVUE-370 IOPAMIDOL (ISOVUE-370) INJECTION 76% COMPARISON:  CT HEAD March 31, 2018 FINDINGS: CTA NECK FINDINGS: AORTIC ARCH: Normal appearance of the thoracic arch, 2 vessel arch is a normal variant. Mild calcific atherosclerosis aortic arch. The origins of the innominate, left Common carotid artery and subclavian artery are widely patent. RIGHT CAROTID SYSTEM: Common carotid artery is patent. Normal appearance of the carotid bifurcation  without hemodynamically significant stenosis by NASCET criteria. Mid cervical segment beaded appearance. LEFT CAROTID SYSTEM: Common carotid artery is patent. Normal appearance of the carotid bifurcation without hemodynamically significant stenosis by NASCET criteria. Mid cervical segment beaded appearance. VERTEBRAL ARTERIES:Left vertebral artery is dominant. Normal appearance of the vertebral arteries, widely patent. SKELETON: No acute osseous process though bone windows have not been submitted. OTHER NECK: Soft tissues of the neck are nonacute though, not tailored for evaluation. UPPER CHEST: Included lung apices are clear. Mild centrilobular emphysema. No superior mediastinal lymphadenopathy. CTA HEAD FINDINGS: ANTERIOR CIRCULATION: Patent cervical internal carotid arteries, petrous, cavernous and supra clinoid internal carotid arteries. Patent anterior communicating artery. Patent anterior and middle cerebral arteries, mild luminal irregularity compatible with atherosclerosis No large vessel occlusion, significant stenosis, contrast extravasation or aneurysm. POSTERIOR CIRCULATION: Patent vertebrobasilar junction and basilar artery, as well as main branch vessels. RIGHT vertebral artery predominantly terminates in the posterior inferior cerebellar artery with extremely diminutive residual RIGHT V4 segment. Patent posterior cerebral arteries. Bilateral posterior communicating arteries present. Fetal origin RIGHT posterior cerebral artery. No large vessel occlusion, significant stenosis, contrast extravasation or aneurysm. VENOUS SINUSES: Major dural venous sinuses are patent though not tailored for evaluation on this angiographic examination. ANATOMIC VARIANTS: None. DELAYED PHASE: No abnormal intracranial enhancement. MIP images reviewed. IMPRESSION: CTA NECK: 1. No hemodynamically significant stenosis ICA's. Patent vertebral arteries. 2. Bilateral internal carotid artery fibromuscular dysplasia. CTA HEAD: 1.  No emergent large vessel occlusion or flow-limiting stenosis. 2. Mild atherosclerosis. Emphysema (ICD10-J43.9).  Aortic Atherosclerosis (ICD10-I70.0). Electronically Signed   By: Awilda Metro M.D.   On: 03/31/2018 17:06   Ct Head Wo Contrast  Result Date: 03/31/2018 CLINICAL DATA:  Left-sided headache, some slurred speech yesterday, smoking history EXAM: CT HEAD WITHOUT CONTRAST TECHNIQUE: Contiguous axial images were obtained from the base of the skull through the vertex without intravenous contrast. COMPARISON:  CT brain scan of 09/05/2008 FINDINGS: Brain: The ventricular system is stable in size and configuration and the septum is midline in position. The fourth ventricle and basilar cisterns are unremarkable. There is a vague low-attenuation in the right caudate nucleus which could represent a small lacunar infarct of uncertain age. No hemorrhage, mass lesion, or other evidence of acute infarction is seen. Vascular: No vascular abnormality is noted on this unenhanced study. Skull: On bone window images, no calvarial abnormality is seen. Sinuses/Orbits: The paranasal sinuses appear well pneumatized. Other: None. IMPRESSION: 1. Possible subacute lacunar infarct in the right caudate nucleus. 2. No other abnormality on unenhanced CT of the brain. Electronically Signed   By: Dwyane Dee M.D.   On: 03/31/2018 15:21   Ct Angio Neck W And/or Wo Contrast  Result Date:  03/31/2018 CLINICAL DATA:  Slurred speech beginning yesterday evening. LEFT posterior headache. History of multiple sclerosis, migraine. EXAM: CT ANGIOGRAPHY HEAD AND NECK TECHNIQUE: Multidetector CT imaging of the head and neck was performed using the standard protocol during bolus administration of intravenous contrast. Multiplanar CT image reconstructions and MIPs were obtained to evaluate the vascular anatomy. Carotid stenosis measurements (when applicable) are obtained utilizing NASCET criteria, using the distal internal carotid diameter  as the denominator. CONTRAST:  ISOVUE-370 IOPAMIDOL (ISOVUE-370) INJECTION 76% COMPARISON:  CT HEAD March 31, 2018 FINDINGS: CTA NECK FINDINGS: AORTIC ARCH: Normal appearance of the thoracic arch, 2 vessel arch is a normal variant. Mild calcific atherosclerosis aortic arch. The origins of the innominate, left Common carotid artery and subclavian artery are widely patent. RIGHT CAROTID SYSTEM: Common carotid artery is patent. Normal appearance of the carotid bifurcation without hemodynamically significant stenosis by NASCET criteria. Mid cervical segment beaded appearance. LEFT CAROTID SYSTEM: Common carotid artery is patent. Normal appearance of the carotid bifurcation without hemodynamically significant stenosis by NASCET criteria. Mid cervical segment beaded appearance. VERTEBRAL ARTERIES:Left vertebral artery is dominant. Normal appearance of the vertebral arteries, widely patent. SKELETON: No acute osseous process though bone windows have not been submitted. OTHER NECK: Soft tissues of the neck are nonacute though, not tailored for evaluation. UPPER CHEST: Included lung apices are clear. Mild centrilobular emphysema. No superior mediastinal lymphadenopathy. CTA HEAD FINDINGS: ANTERIOR CIRCULATION: Patent cervical internal carotid arteries, petrous, cavernous and supra clinoid internal carotid arteries. Patent anterior communicating artery. Patent anterior and middle cerebral arteries, mild luminal irregularity compatible with atherosclerosis No large vessel occlusion, significant stenosis, contrast extravasation or aneurysm. POSTERIOR CIRCULATION: Patent vertebrobasilar junction and basilar artery, as well as main branch vessels. RIGHT vertebral artery predominantly terminates in the posterior inferior cerebellar artery with extremely diminutive residual RIGHT V4 segment. Patent posterior cerebral arteries. Bilateral posterior communicating arteries present. Fetal origin RIGHT posterior cerebral artery. No  large vessel occlusion, significant stenosis, contrast extravasation or aneurysm. VENOUS SINUSES: Major dural venous sinuses are patent though not tailored for evaluation on this angiographic examination. ANATOMIC VARIANTS: None. DELAYED PHASE: No abnormal intracranial enhancement. MIP images reviewed. IMPRESSION: CTA NECK: 1. No hemodynamically significant stenosis ICA's. Patent vertebral arteries. 2. Bilateral internal carotid artery fibromuscular dysplasia. CTA HEAD: 1. No emergent large vessel occlusion or flow-limiting stenosis. 2. Mild atherosclerosis. Emphysema (ICD10-J43.9).  Aortic Atherosclerosis (ICD10-I70.0). Electronically Signed   By: Awilda Metro M.D.   On: 03/31/2018 17:06   Mr Laqueta Jean ZO Contrast  Result Date: 04/01/2018 CLINICAL DATA:  Left-sided facial droop. Chronic pain with multiple sclerosis. EXAM: MRI HEAD WITHOUT AND WITH CONTRAST TECHNIQUE: Multiplanar, multiecho pulse sequences of the brain and surrounding structures were obtained without and with intravenous contrast. CONTRAST:  10mL MULTIHANCE GADOBENATE DIMEGLUMINE 529 MG/ML IV SOLN COMPARISON:  Head CT/CTA 03/31/2018 FINDINGS: BRAIN: There is no acute infarct, acute hemorrhage or mass effect. The midline structures are normal. There are no old infarcts. The white matter signal is normal for the patient's age. The CSF spaces are normal for age, with no hydrocephalus. Susceptibility-sensitive sequences show no chronic microhemorrhage or superficial siderosis. VASCULAR: Major intracranial arterial and venous sinus flow voids are preserved. SKULL AND UPPER CERVICAL SPINE: The visualized skull base, calvarium, upper cervical spine and extracranial soft tissues are normal. SINUSES/ORBITS: Left maxillary retention cyst. The orbits are normal. IMPRESSION: Normal brain MRI.  No evidence of active or chronic demyelination. Electronically Signed   By: Deatra Robinson M.D.   On: 04/01/2018 00:46  Subjective: - no chest pain,  shortness of breath, no abdominal pain, nausea or vomiting.   Discharge Exam: Vitals:   04/01/18 1226 04/01/18 1631  BP: 109/72 115/77  Pulse: (!) 52 (!) 54  Resp: 18 18  Temp: 98.6 F (37 C) 98.9 F (37.2 C)  SpO2: 98% 100%    General: Pt is alert, awake, not in acute distress Cardiovascular: RRR, S1/S2 +, no rubs, no gallops Respiratory: CTA bilaterally, no wheezing, no rhonchi Abdominal: Soft, NT, ND, bowel sounds + Extremities: no edema, no cyanosis    The results of significant diagnostics from this hospitalization (including imaging, microbiology, ancillary and laboratory) are listed below for reference.     Microbiology: No results found for this or any previous visit (from the past 240 hour(s)).   Labs: BNP (last 3 results) No results for input(s): BNP in the last 8760 hours. Basic Metabolic Panel: Recent Labs  Lab 03/31/18 1351  NA 137  K 4.3  CL 103  CO2 28  GLUCOSE 80  BUN 13  CREATININE 0.81  CALCIUM 9.3   Liver Function Tests: Recent Labs  Lab 03/31/18 1351  AST 22  ALT 17  ALKPHOS 63  BILITOT 0.3  PROT 7.3  ALBUMIN 4.4   No results for input(s): LIPASE, AMYLASE in the last 168 hours. No results for input(s): AMMONIA in the last 168 hours. CBC: Recent Labs  Lab 03/31/18 1351  WBC 6.5  NEUTROABS 3.3  HGB 15.2*  HCT 43.6  MCV 94.4  PLT 251   Cardiac Enzymes: Recent Labs  Lab 03/31/18 1351  TROPONINI <0.03   BNP: Invalid input(s): POCBNP CBG: No results for input(s): GLUCAP in the last 168 hours. D-Dimer No results for input(s): DDIMER in the last 72 hours. Hgb A1c No results for input(s): HGBA1C in the last 72 hours. Lipid Profile No results for input(s): CHOL, HDL, LDLCALC, TRIG, CHOLHDL, LDLDIRECT in the last 72 hours. Thyroid function studies No results for input(s): TSH, T4TOTAL, T3FREE, THYROIDAB in the last 72 hours.  Invalid input(s): FREET3 Anemia work up No results for input(s): VITAMINB12, FOLATE, FERRITIN,  TIBC, IRON, RETICCTPCT in the last 72 hours. Urinalysis    Component Value Date/Time   COLORURINE YELLOW 09/08/2016 1118   APPEARANCEUR CLEAR 09/08/2016 1118   LABSPEC 1.025 09/08/2016 1118   PHURINE 5.5 09/08/2016 1118   GLUCOSEU NEGATIVE 09/08/2016 1118   HGBUR NEGATIVE 09/08/2016 1118   BILIRUBINUR NEGATIVE 09/08/2016 1118   KETONESUR NEGATIVE 09/08/2016 1118   PROTEINUR NEGATIVE 09/08/2016 1118   NITRITE NEGATIVE 09/08/2016 1118   LEUKOCYTESUR NEGATIVE 09/08/2016 1118   Sepsis Labs Invalid input(s): PROCALCITONIN,  WBC,  LACTICIDVEN   Time coordinating discharge: 40 minutes  SIGNED:  Pamella Pert, MD  [facility] 04/01/2018, 6:13 PM Pager [phone]  If 7PM-7AM, please contact night-coverage [email or web address] Password [email or web address]

## 2018-04-01 NOTE — Evaluation (Signed)
Speech Language Pathology Evaluation Patient Details Name: Sharon Gay MRN: [id number] DOB: 1966-05-22 Today's Date: 04/01/2018 Time: 3646-8032 SLP Time Calculation (min) (ACUTE ONLY): 24 min  Problem List:  Patient Active Problem List   Diagnosis Date Noted  . Stroke [facility]) probable 03/31/2018  . History of multiple sclerosis 03/31/2018  . Smoker 10/31/2015  . Postmenopausal 10/31/2015  . History of colonic polyps 10/20/2015  . Dysphagia 10/20/2015  . Hepatomegaly 10/20/2015  . Lymphadenopathy 09/07/2015  . CAP (community acquired pneumonia) 08/25/2015  . Severe sepsis with septic shock ([place]) 08/25/2015  . SIRS (systemic inflammatory response syndrome) ([place]) 08/25/2015  . Abdominal pain 08/09/2015  . 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 History of diagnostic tests 10/06/2012  . Hyperlipidemia   . Mitral valve prolapse   . Anxiety   . COPD (chronic obstructive pulmonary disease) ([place])   . Chest pain   . Gastroesophageal reflux disease   . MIGRAINE HEADACHE 03/30/2009   Past Medical History:  Past Medical History:  Diagnosis Date  . AC (acromioclavicular) joint bone spurs   . Anxiety   . Arthritis   . Back pain   . Chest pain 2003   11/2007: Admission with unstable angina; normal coronary angiography  with [facility]; nl EF  . Chronic pain   . Colitis 2005  . COPD (chronic obstructive pulmonary disease) ([place])    11/2003: Normal PFTs ex minimal increased volumes and decreased the DLCO; Followed in Pulmonary clinic/ [facility]/ [place]   . Depression   . Gastroesophageal reflux disease 2009   Hiatal hernia; esophageal dilatation for Schatzki's ring in 04/2008; chronic constipation; irritable bowel syndrome  . History of pneumonia   . Hyperlipidemia 2009   Lipid profile 07/2012:227, 91, 56, 153  . Lung disease 2010  . Migraines   . Mitral valve prolapse   . Mitral valve prolapse   . MS (multiple sclerosis) ([place])   .  MS (multiple sclerosis) ([place]) 2001  . Mycobacterium avium complex ([place]) 2010   01/2009: + AFB stain on bronchial washings  . PONV (postoperative nausea and vomiting)   . Postmenopausal 10/31/2015  . Smoker 10/31/2015   Past Surgical History:  Past Surgical History:  Procedure Laterality Date  . COLONOSCOPY W/ POLYPECTOMY  2006   Dr. Gala Romney: 3 diminutive polyps in descending colon, unknown path   . ESOPHAGOGASTRODUODENOSCOPY  2009   Dr. Gala Romney: normal esophagus, small hiatal hernia, s/p empiric dilatilon   . ESOPHAGOGASTRODUODENOSCOPY  2003   Schatzki's ring s/p dilation  . FIBEROPTIC BRONCHOSCOPY  2010  . LIVER BIOPSY    . ORIF RADIAL FRACTURE  2012   Left  . VOCAL CORD POLYP REMOVED  2003   HPI:  52 y.o. female with medical history significant of hypertension, anxiety ,chronic pain and multiple sclerosis presents with left-sided facial droop.  Last night patient started having left neck pain.  It progressed to include left occipital pain as well.  Today she noticed she had a droop on her left side of her face.  Her sister noticed that she was drooling some and her speech was dysarthric.  MRI head 04/01/18 indicated Normal brain MRI.  No evidence of active or chronic demyelination; previous CT head on 03/31/18 indicated Possible subacute lacunar infarct in the right caudate nucleus  Assessment / Plan / Recommendation Clinical Impression   Pt with slight left facial sensory impairment, but this was adequate for swallowing/speech purposes during SLE; MOCA [facility] Cognitive Assessment) completed with a score of  28/30 indicating a normal score for this assessment; pt appears to exhibit adequate linguistic/cognitive skills, speech is 100% intelligible within simple conversational tasks; no f/u indicated for ST once d/c is initiated; ST will s/o at this time.    SLP Assessment  SLP Recommendation/Assessment: Patient does not need any further Speech Language Pathology Services SLP Visit Diagnosis:  Other (comment)    Follow Up Recommendations  None    Frequency and Duration     n/a      SLP Evaluation Cognition  Overall Cognitive Status: Within Functional Limits for tasks assessed Arousal/Alertness: Awake/alert Orientation Level: Oriented X4 Memory: Appears intact Awareness: Appears intact Problem Solving: Appears intact Behaviors: Perseveration Safety/Judgment: Appears intact       Comprehension  Auditory Comprehension Overall Auditory Comprehension: Appears within functional limits for tasks assessed Yes/No Questions: Within Functional Limits Commands: Within Functional Limits Conversation: Simple Interfering Components: Pain EffectiveTechniques: Repetition Visual Recognition/Discrimination Discrimination: Within Function Limits Reading Comprehension Reading Status: Within funtional limits    Expression Expression Primary Mode of Expression: Verbal Verbal Expression Overall Verbal Expression: Appears within functional limits for tasks assessed Initiation: No impairment Level of Generative/Spontaneous Verbalization: Conversation Repetition: No impairment Naming: No impairment Pragmatics: No impairment Non-Verbal Means of Communication: Not applicable Written Expression Dominant Hand: Right Written Expression: Within Functional Limits   Oral / Motor  Oral Motor/Sensory Function Overall Oral Motor/Sensory Function: Other (comment)(left slight facial numbness) Motor Speech Overall Motor Speech: Appears within functional limits for tasks assessed Respiration: Within functional limits Phonation: Normal Resonance: Within functional limits Articulation: Within functional limitis Intelligibility: Intelligible Motor Planning: Witnin functional limits Motor Speech Errors: Not applicable                       Elvina Sidle, M.S., CCC-SLP 04/01/2018, 11:31 AM

## 2018-04-01 NOTE — Progress Notes (Signed)
PT Cancellation Note  Patient Details Name: Sharon Gay MRN: [id number] DOB: [birth year]   Cancelled Treatment:    Reason Eval/Treat Not Completed: PT screened, no needs identified, will sign off PT observing patient with OT. Ambulating with good stability and independence. No acute PT needs identified. PT to sign off. Thanks for the referral.   Kipp Laurence, PT, DPT 04/01/18 12:57 PM Pager: [phone]

## 2018-04-01 NOTE — Evaluation (Signed)
Occupational Therapy Evaluation Patient Details Name: Sharon Gay MRN: [id number] DOB: September 22, 1965 Today's Date: 04/01/2018    History of Present Illness 52 yo female with L neck and head pain. subacute lacunar infarct in the right caudate nucleus however this was followed up by MRI which did not show any infarct PMH : MS   Clinical Impression   Patient evaluated by Occupational Therapy with no further acute OT needs identified. All education has been completed and the patient has no further questions. See below for any follow-up Occupational Therapy or equipment needs. OT to sign off. Thank you for referral.      Follow Up Recommendations  No OT follow up    Equipment Recommendations  None recommended by OT    Recommendations for Other Services       Precautions / Restrictions Precautions Precautions: None      Mobility Bed Mobility Overal bed mobility: Independent                Transfers Overall transfer level: Independent                    Balance                                           ADL either performed or assessed with clinical judgement   ADL Overall ADL's : Independent                                       General ADL Comments: pt able to complete stairs x5, pt able to incr and decr speed, pt able to change directions. pt able to complete grooming at sink. pt able to work in a distracting environment. pt able to problem solve and locate items. pt does report favoring R LE and noted that her flip flops at home are more worn on the R LE vs L LE flip flop. pt concerned with getting medical care if needed in the future [place]. pt educated on "BE FAST" ANd directed back to stroke booklet. pt plans to review and share with family     Vision Patient Visual Report: Blurring of vision Additional Comments: pt vision wfl for baseline. pt reports blurred vision that is  baseline. pt able to scan. locate and retrieve all necessary items     Perception     Praxis      Pertinent Vitals/Pain Pain Assessment: Faces Pain Score: 8  Faces Pain Scale: Hurts little more Pain Location: head Pain Descriptors / Indicators: Headache Pain Intervention(s): Repositioned     Hand Dominance Right   Extremity/Trunk Assessment Upper Extremity Assessment Upper Extremity Assessment: Overall WFL for tasks assessed   Lower Extremity Assessment Lower Extremity Assessment: Overall WFL for tasks assessed   Cervical / Trunk Assessment Cervical / Trunk Assessment: Normal   Communication Communication Communication: No difficulties   Cognition Arousal/Alertness: Awake/alert Behavior During Therapy: WFL for tasks assessed/performed Overall Cognitive Status: Within Functional Limits for tasks assessed                                     General Comments  Exercises     Shoulder Instructions      Home Living Family/patient expects to be discharged to:: Private residence Living Arrangements: Spouse/significant other Available Help at Discharge: Family Type of Home: Mobile home Home Access: Stairs to enter Entrance Stairs-Number of Steps: 8 Entrance Stairs-Rails: Can reach both Home Layout: One level     Bathroom Shower/Tub: Producer, television/film/video: Standard     Home Equipment: None   Additional Comments: x5 dogs all toy poodles. walks 1.5 miles every morning with neighbor  Lives With: Spouse;Family    Prior Functioning/Environment Level of Independence: Independent                 OT Problem List:        OT Treatment/Interventions:      OT Goals(Current goals can be found in the care plan section) Acute Rehab OT Goals Patient Stated Goal: to return home to her dogs Potential to Achieve Goals: Good  OT Frequency:     Barriers to D/C:            Co-evaluation              AM-PAC PT "6 Clicks"  Daily Activity     Outcome Measure Help from another person eating meals?: None Help from another person taking care of personal grooming?: None Help from another person toileting, which includes using toliet, bedpan, or urinal?: None Help from another person bathing (including washing, rinsing, drying)?: None Help from another person to put on and taking off regular upper body clothing?: None Help from another person to put on and taking off regular lower body clothing?: None 6 Click Score: 24   End of Session Equipment Utilized During Treatment: Gait belt Nurse Communication: Mobility status  Activity Tolerance: Patient tolerated treatment well Patient left: in bed;with call bell/phone within reach;with family/visitor present(sister)  OT Visit Diagnosis: Unsteadiness on feet (R26.81)                Time: 7035-0093 OT Time Calculation (min): 21 min Charges:  OT General Charges $OT Visit: 1 Visit OT Evaluation $OT Eval Moderate Complexity: 1 Mod G-Codes:      Mateo Flow   OTR/L Pager: [phone] Office: [phone] .   Boone Master B 04/01/2018, 2:49 PM

## 2018-04-02 LAB — HIV ANTIBODY (ROUTINE TESTING W REFLEX): HIV SCREEN 4TH GENERATION: NONREACTIVE

## 2018-06-19 ENCOUNTER — Emergency Department (HOSPITAL_COMMUNITY): Payer: Self-pay

## 2018-06-19 ENCOUNTER — Observation Stay (HOSPITAL_COMMUNITY)
Admission: EM | Admit: 2018-06-19 | Discharge: 2018-06-20 | Disposition: A | Discharge status: Home / Self Care | Payer: Self-pay | Attending: Emergency Medicine | Admitting: Emergency Medicine

## 2018-06-19 ENCOUNTER — Encounter (HOSPITAL_COMMUNITY): Payer: Self-pay | Admitting: Emergency Medicine

## 2018-06-19 DIAGNOSIS — Z7982 Long term (current) use of aspirin: Secondary | ICD-10-CM | POA: Insufficient documentation

## 2018-06-19 DIAGNOSIS — Z8669 Personal history of other diseases of the nervous system and sense organs: Secondary | ICD-10-CM

## 2018-06-19 DIAGNOSIS — E785 Hyperlipidemia, unspecified: Secondary | ICD-10-CM | POA: Diagnosis present

## 2018-06-19 DIAGNOSIS — J449 Chronic obstructive pulmonary disease, unspecified: Secondary | ICD-10-CM | POA: Insufficient documentation

## 2018-06-19 DIAGNOSIS — R079 Chest pain, unspecified: Secondary | ICD-10-CM | POA: Diagnosis present

## 2018-06-19 DIAGNOSIS — G43009 Migraine without aura, not intractable, without status migrainosus: Secondary | ICD-10-CM

## 2018-06-19 DIAGNOSIS — J4489 Other specified chronic obstructive pulmonary disease: Secondary | ICD-10-CM | POA: Diagnosis present

## 2018-06-19 DIAGNOSIS — F419 Anxiety disorder, unspecified: Secondary | ICD-10-CM | POA: Diagnosis present

## 2018-06-19 DIAGNOSIS — Z79899 Other long term (current) drug therapy: Secondary | ICD-10-CM | POA: Insufficient documentation

## 2018-06-19 DIAGNOSIS — G35 Multiple sclerosis: Secondary | ICD-10-CM

## 2018-06-19 DIAGNOSIS — G43909 Migraine, unspecified, not intractable, without status migrainosus: Secondary | ICD-10-CM | POA: Diagnosis present

## 2018-06-19 DIAGNOSIS — G459 Transient cerebral ischemic attack, unspecified: Principal | ICD-10-CM | POA: Diagnosis present

## 2018-06-19 DIAGNOSIS — Z8673 Personal history of transient ischemic attack (TIA), and cerebral infarction without residual deficits: Secondary | ICD-10-CM | POA: Insufficient documentation

## 2018-06-19 DIAGNOSIS — K219 Gastro-esophageal reflux disease without esophagitis: Secondary | ICD-10-CM | POA: Diagnosis present

## 2018-06-19 DIAGNOSIS — R531 Weakness: Secondary | ICD-10-CM

## 2018-06-19 DIAGNOSIS — F17210 Nicotine dependence, cigarettes, uncomplicated: Secondary | ICD-10-CM | POA: Insufficient documentation

## 2018-06-19 DIAGNOSIS — M1990 Unspecified osteoarthritis, unspecified site: Secondary | ICD-10-CM | POA: Diagnosis present

## 2018-06-19 LAB — DIFFERENTIAL
Abs Immature Granulocytes: 0.02 10*3/uL (ref 0.00–0.07)
Basophils Absolute: 0.1 10*3/uL (ref 0.0–0.1)
Basophils Relative: 1 %
EOS PCT: 4 %
Eosinophils Absolute: 0.4 10*3/uL (ref 0.0–0.5)
Immature Granulocytes: 0 %
LYMPHS ABS: 3 10*3/uL (ref 0.7–4.0)
LYMPHS PCT: 37 %
MONO ABS: 0.6 10*3/uL (ref 0.1–1.0)
MONOS PCT: 7 %
Neutro Abs: 4.1 10*3/uL (ref 1.7–7.7)
Neutrophils Relative %: 51 %

## 2018-06-19 LAB — I-STAT CHEM 8, ED
BUN: 10 mg/dL (ref 6–20)
Calcium, Ion: 1.21 mmol/L (ref 1.15–1.40)
Chloride: 100 mmol/L (ref 98–111)
Creatinine, Ser: 0.9 mg/dL (ref 0.44–1.00)
GLUCOSE: 91 mg/dL (ref 70–99)
HCT: 45 % (ref 36.0–46.0)
HEMOGLOBIN: 15.3 g/dL — AB (ref 12.0–15.0)
POTASSIUM: 4.6 mmol/L (ref 3.5–5.1)
SODIUM: 137 mmol/L (ref 135–145)
TCO2: 29 mmol/L (ref 22–32)

## 2018-06-19 LAB — I-STAT TROPONIN, ED: Troponin i, poc: 0 ng/mL (ref 0.00–0.08)

## 2018-06-19 LAB — APTT: aPTT: 33 seconds (ref 24–36)

## 2018-06-19 LAB — CBC
HEMATOCRIT: 43.7 % (ref 36.0–46.0)
Hemoglobin: 14.6 g/dL (ref 12.0–15.0)
MCH: 32 pg (ref 26.0–34.0)
MCHC: 33.4 g/dL (ref 30.0–36.0)
MCV: 95.8 fL (ref 80.0–100.0)
Platelets: 331 10*3/uL (ref 150–400)
RBC: 4.56 MIL/uL (ref 3.87–5.11)
RDW: 12.9 % (ref 11.5–15.5)
WBC: 8.2 10*3/uL (ref 4.0–10.5)
nRBC: 0 % (ref 0.0–0.2)

## 2018-06-19 LAB — PROTIME-INR
INR: 0.92
PROTHROMBIN TIME: 12.3 s (ref 11.4–15.2)

## 2018-06-19 MED ORDER — ONDANSETRON HCL 4 MG/2ML IJ SOLN
4.0000 mg | Freq: Once | INTRAMUSCULAR | Status: AC
  Administered 2018-06-19: 4 mg via INTRAVENOUS
  Filled 2018-06-19: qty 2

## 2018-06-19 MED ORDER — IOPAMIDOL (ISOVUE-370) INJECTION 76%
100.0000 mL | Freq: Once | INTRAVENOUS | Status: AC | PRN
  Administered 2018-06-19: 100 mL via INTRAVENOUS

## 2018-06-19 MED ORDER — FENTANYL CITRATE (PF) 100 MCG/2ML IJ SOLN
50.0000 ug | Freq: Once | INTRAMUSCULAR | Status: AC
  Administered 2018-06-19: 50 ug via INTRAVENOUS
  Filled 2018-06-19: qty 2

## 2018-06-19 NOTE — ED Provider Notes (Signed)
[facility] EMERGENCY DEPARTMENT Provider Note   CSN: [id number] Arrival date & time: 06/19/18  2223     History   Chief Complaint Chief Complaint  Patient presents with  . Headache    HPI Sharon Gay is a 52 y.o. female.  Patient is a 52 year old female who presents to the emergency department with a complaint of strokelike symptoms.  The patient states that she was in her usual state of health until approximately 5 PM this afternoon.  She was with her brother while he was receiving chemo therapy.  The patient states that she began to have left-sided headache, and pain in the left neck.  This was accompanied by slurring of her speech.  She then noted some tingling, and some drooling.  She said she did not go to the emergency department at that time because she was afraid she was upset her brother who was having the treatments.  She says that they have lasted up until her current emergency department visit at 11:02 PM.  The patient states that the headache seems to be getting worse, and is accompanied by pain in the temples.  There was no loss of consciousness reported.  There was no loss of bowel or bladder function.  There was no fall, but the patient states that she feels frequently off balance.  The patient states that she drove a straight shift from her location home, and she felt as though her left side was weaker than usual.  It is of note that the patient had similar symptoms approximately 2 months ago.  At which time she was diagnosed with strokelike symptoms, but no actual cerebrovascular accident was diagnosed.  The patient states she has not had follow-up with neurology as of yet.  The patient states that this problem seems to have upset her memory as well.     Past Medical History:  Diagnosis Date  . AC (acromioclavicular) joint bone spurs   . Anxiety   . Arthritis   . Back pain   . Chest pain 2003   11/2007: Admission with unstable angina; normal coronary  angiography  with [facility]; nl EF  . Chronic pain   . Colitis 2005  . COPD (chronic obstructive pulmonary disease) ([place])    11/2003: Normal PFTs ex minimal increased volumes and decreased the DLCO; Followed in Pulmonary clinic/  Healthcare/ [place]   . Depression   . Gastroesophageal reflux disease 2009   Hiatal hernia; esophageal dilatation for Schatzki's ring in 04/2008; chronic constipation; irritable bowel syndrome  . History of pneumonia   . Hyperlipidemia 2009   Lipid profile 07/2012:227, 91, 56, 153  . Lung disease 2010  . Migraines   . Mitral valve prolapse   . Mitral valve prolapse   . MS (multiple sclerosis) ([place])   . MS (multiple sclerosis) ([place]) 2001  . 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 PONV (postoperative nausea and vomiting)   . Postmenopausal 10/31/2015  . Smoker 10/31/2015    Patient Active Problem List   Diagnosis Date Noted  . TIA (transient ischemic attack) 04/01/2018  . Stroke [facility]) probable 03/31/2018  . History of multiple sclerosis 03/31/2018  . Smoker 10/31/2015  . Postmenopausal 10/31/2015  . History of colonic polyps 10/20/2015  . Dysphagia 10/20/2015  . Hepatomegaly 10/20/2015  . Lymphadenopathy 09/07/2015  . CAP (community acquired pneumonia) 08/25/2015  . Severe sepsis with septic shock ([place]) 08/25/2015  . SIRS (systemic  inflammatory response syndrome) ([place]) 08/25/2015  . Abdominal pain 08/09/2015  . 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 History of diagnostic tests 10/06/2012  . Hyperlipidemia   . Mitral valve prolapse   . Anxiety   . COPD (chronic obstructive pulmonary disease) ([place])   . Chest pain   . Gastroesophageal reflux disease   . MIGRAINE HEADACHE 03/30/2009    Past Surgical History:  Procedure Laterality Date  . COLONOSCOPY W/ POLYPECTOMY  2006   Dr. Gala Romney: 3 diminutive polyps in descending colon, unknown path   .  ESOPHAGOGASTRODUODENOSCOPY  2009   Dr. Gala Romney: normal esophagus, small hiatal hernia, s/p empiric dilatilon   . ESOPHAGOGASTRODUODENOSCOPY  2003   Schatzki's ring s/p dilation  . FIBEROPTIC BRONCHOSCOPY  2010  . LIVER BIOPSY    . ORIF RADIAL FRACTURE  2012   Left  . VOCAL CORD POLYP REMOVED  2003     OB History    Gravida  0   Para  0   Term  0   Preterm  0   AB  0   Living  0     SAB  0   TAB  0   Ectopic  0   Multiple  0   Live Births               Home Medications    Prior to Admission medications   Medication Sig Start Date End Date Taking? Authorizing Provider  albuterol (PROVENTIL HFA;VENTOLIN HFA) 108 (90 BASE) MCG/ACT inhaler Inhale 1-2 puffs into the lungs every 6 (six) hours as needed for wheezing or shortness of breath. 08/25/15   Lily Kocher, PA-C  ALPRAZolam Duanne Moron) 1 MG tablet Take 1 mg by mouth 4 (four) times daily as needed for anxiety.    [provider]  aspirin EC 81 MG tablet Take 81 mg by mouth daily.    [provider]  atorvastatin (LIPITOR) 40 MG tablet Take 1 tablet (40 mg total) by mouth daily. 04/01/18 04/01/19  Caren Griffins, MD  benzonatate (TESSALON) 100 MG capsule Take 100 mg by mouth 3 (three) times daily as needed for cough.  12/01/15   [provider]  Cholecalciferol (VITAMIN D3) 50000 units CAPS Take 1 capsule by mouth every Monday.     [provider]  dexlansoprazole (DEXILANT) 60 MG capsule Take 60 mg by mouth daily.    [provider]  fluticasone (FLONASE) 50 MCG/ACT nasal spray Place 2 sprays into both nostrils 3 (three) times daily.    [provider]  lidocaine (XYLOCAINE) 5 % ointment Apply 1 application topically daily as needed. 11/28/15   [provider]  meloxicam (MOBIC) 7.5 MG tablet Take 7.5 mg by mouth 2 (two) times daily as needed for pain.    [provider]  Naphazoline-Glycerin (REDNESS RELIEF) 0.012-0.25 % SOLN Apply 1-2 drops to  eye daily as needed (for allergies).    [provider]  oxycodone (ROXICODONE) 30 MG immediate release tablet Take 30-60 mg by mouth every 4 (four) hours as needed for pain.    [provider]  oxyCODONE-acetaminophen (PERCOCET) 10-325 MG tablet 1 tablet every 4 (four) hours as needed.  10/20/15   [provider]  PARoxetine (PAXIL) 40 MG tablet Take 40 mg by mouth daily.     [provider]  promethazine (PHENERGAN) 25 MG tablet Take 25 mg by mouth every 6 (six) hours as needed for nausea or vomiting.    [provider]  Saline (SIMPLY SALINE) 0.9 % AERS Place 1-2 sprays into the nose daily as needed (for nasal congestion).    [provider]    Family History Family History  Problem Relation Age of Onset  . Emphysema Maternal Grandfather        was a smoker  . Arthritis Sister   . Mental retardation Sister   . Heart disease Sister        Also brother; cardiomyopathy  . Thyroid disease Sister   . Fibromyalgia Sister   . Arthritis Sister   . Migraines Sister   . COPD Sister   . Heart attack Father 15  . Arthritis Sister   . Migraines Sister   . Other Sister        brain tumor  . Arthritis Brother   . Arthritis Brother   . Colon cancer Neg Hx     Social History Social History   Tobacco Use  . Smoking status: Current Every Day Smoker    Packs/day: 0.50    Years: 30.00    Pack years: 15.00    Types: Cigarettes    Last attempt to quit: 12/14/2015    Years since quitting: 2.5  . Smokeless tobacco: Never Used  . Tobacco comment: smokes 4-5 cig daily  Substance Use Topics  . Alcohol use: No  . Drug use: No     Allergies   Penicillins; Morphine; Rifampin; Topamax [topiramate]; Cymbalta [duloxetine hcl]; and Tequin [gatifloxacin]   Review of Systems Review of Systems  Constitutional: Negative for activity change.       All ROS Neg except as noted in HPI  HENT: Negative for nosebleeds.   Eyes: Negative for  photophobia and discharge.  Respiratory: Negative for cough, shortness of breath and wheezing.   Cardiovascular: Negative for chest pain and palpitations.  Gastrointestinal: Negative for abdominal pain and blood in stool.  Genitourinary: Negative for dysuria, frequency and hematuria.  Musculoskeletal: Positive for arthralgias. Negative for back pain and neck pain.  Skin: Negative.   Neurological: Positive for speech difficulty, weakness, numbness and headaches. Negative for dizziness, seizures and syncope.  Psychiatric/Behavioral: Negative for confusion and hallucinations.     Physical Exam Updated Vital Signs BP 116/73 (BP Location: Right Arm)   Pulse 77   Temp 98.1 F (36.7 C) (Oral)   Resp 18   Ht _0  (1.803 m)   Wt 62.1 kg   SpO2 100%   BMI 19.11 kg/m   Physical Exam  Constitutional: She is oriented to person, place, and time. She appears well-developed and well-nourished.  Non-toxic appearance.  HENT:  Head: Normocephalic.  Right Ear: Tympanic membrane and external ear normal.  Left Ear: Tympanic membrane and external ear normal.  Eyes: Pupils are equal, round, and reactive to light. EOM and lids are normal.  Neck: Normal range of motion. Neck supple. Carotid bruit is not present.  Cardiovascular: Normal rate, regular rhythm, normal heart sounds, intact distal pulses and normal pulses. Exam reveals no gallop and no friction rub.  Pulmonary/Chest: Breath sounds normal. No stridor. No respiratory distress. She has no wheezes.  Abdominal: Soft. Bowel sounds are normal. There is no tenderness. There is no guarding.  Musculoskeletal: Normal range of motion.  There is tightness and tenseness of the right and left upper trapezius extending into the neck, particularly at behind the left ear.  Lymphadenopathy:       Head (right side): No submandibular adenopathy present.       Head (left side): No  submandibular adenopathy present.    She has no cervical adenopathy.    Neurological: She is alert and oriented to person, place, and time. No cranial nerve deficit or sensory deficit. Gait abnormal.  The ocular fundi show sharp disc.  No edema, and no hemorrhage.  The pupils are equal and reactive to light.  The extraocular movements are intact.  There is no loss of facial sensation.  The patient is able to balance herself on the edge of the bed without problem.  She elevates the palate and has a good gag reflex.  The tongue is midline with easy protrusion.  The speech is clear.  Shoulder shrugs are symmetrical. The patient has some left-sided weakness and some ulnar drift on the left.  I am unsure of her baseline gait, the patient walks with a cane, but there appears to be some ataxia present.  Skin: Skin is warm and dry. Capillary refill takes 2 to 3 seconds.  Psychiatric: She has a normal mood and affect. Her speech is normal.  Nursing note and vitals reviewed.    ED Treatments / Results  Labs (all labs ordered are listed, but only abnormal results are displayed) Labs Reviewed - No data to display  EKG None  Radiology No results found.  Procedures Procedures (including critical care time) CRITICAL CARE Performed by: Lily Kocher Total critical care time: *35** minutes Critical care time was exclusive of separately billable procedures and treating other patients. Critical care was necessary to treat or prevent imminent or life-threatening deterioration. Critical care was time spent personally by me on the following activities: development of treatment plan with patient and/or surrogate as well as nursing, discussions with consultants, evaluation of patient's response to treatment, examination of patient, obtaining history from patient or surrogate, ordering and performing treatments and interventions, ordering and review of laboratory studies, ordering and review of radiographic studies, pulse oximetry and re-evaluation of patient's  condition. Medications Ordered in ED Medications - No data to display   Initial Impression / Assessment and Plan / ED Course  I have reviewed the triage vital signs and the nursing notes.  Pertinent labs & imaging results that were available during my care of the patient were reviewed by me and considered in my medical decision making (see chart for details).       Final Clinical Impressions(s) / ED Diagnoses MDM  Vital signs reviewed.  Pulse oximetry is 100% on room air.  Within normal limits by my interpretation.  The patient's speech is currently clear, and there are no evidence of drooling at this time.  There is noted some weakness on the left side.  The patient states that this is more profound than her usual baseline.  Patient also complains of severe headache on the left side.  She states that the symptoms are similar to the strokelike symptoms that she had 2 months ago.  Code stroke called.  Patient seen with me by Dr. Wyvonnia Dusky.  11:33 PM.  Damaris Schooner with tele-neurologist.  They will conduct neurology examination and evaluation. I-STAT Chem-8 nonacute. No drooling noted on recheck.  Speech is still clear and understandable. Troponin is negative for acute event.  Complete blood count is nonacute. CT angios head and neck are negative for acute stroke or acute abnormalities.  Telemetry neurologist suggest that this could be an atypical migraine, but could also be a cerebrovascular accident.  They suggested the patient be admitted and that the patient received a migraine cocktail, and have an MRI of the  brain.  Patient states that the fentanyl that was given earlier has not touched her pain.  The migraine cocktail was given.  I discussed the findings on the examination and the findings of the lab and the findings of the CTs with the patient in terms of which he understands.  Patient says that she is claustrophobic and has panic attacks and when she is placed in the back of the  ambulance, but if she can receive something to help calm her down that she will be in agreement to be transferred ported to the [place] for admission.  Call placed to triad hospitalist.  Case discussed with Dr. Olevia Bowens.  He will come down to see the patient here in the emergency department.   Final diagnoses:  Left-sided weakness  Atypical migraine    ED Discharge Orders    None       Lily Kocher, PA-C 06/20/18 0154    Ezequiel Essex, MD 06/20/18 [phone]

## 2018-06-19 NOTE — Consult Note (Signed)
   [facility] Tele[facility] Consult Services  This is a STAT Consultation for stroke  Impression:  Complicated migriane vs stroke  Recommendations:  Recommend admission for observation, TIAstroke workup MRI brain to definitively rule out stroke Suspicion this could represent migraine given her history and prior negative workups She is refusing an MRI but she can receive a migraine cocktail to see if this abates her symptoms Out of the TPA window  ---------------------------------------------------------------------  CC: stroke  History of Present Illness:  52 yo F hx prior TIA stroke and migraine who presents with stroke like symptoms including left sided weakness and numbness since 1700.  She was out of the tPA window on presentation. She reports having blurred vision slurred speech.  She does take aspirin daily. She woke up with bitemporal migraine, but then she started having pain in her neck, back of the left occiput to the crown.  The stroke like symptoms started later in the day.    She is claustrophobic and refusing MRI  Diagnostic Testing: CT head neg for stroke or bleed CTA results pending, no clear LVO noted   Exam:  Mental Status:  Awake, alert, oriented  Naming: Intact Repetition: Intact   Speech: fluent  Cranial Nerves:  Pupils: Equal round and reactive to light Extraocular movements: Intact in all cardinal gaze Ptosis: Absent Visual fields: Intact to finger counting Facial sensation: Diminished on left forehead and face as well Facial movements: Intact and symmetric    Motor Exam:  RUE and LE drift.  Tremor/Abnormal Movements:  Resting tremor: Absent Intention tremor: Absent Postural tremor: Absent  Sensory Exam:   Light touch: Diminished on left side    Coordination:   Finger to nose: ImpairedLUE  Medical Decision Making:  - Extensive number of diagnosis or management options are considered above.   - Extensive amount of complex data  reviewed.   - High risk of complication and/or morbidity or mortality are associated with differential diagnostic considerations above.  - There may be uncertain outcome and increased probability of prolonged functional impairment or high probability of severe prolonged functional impairment associated with some of these differential diagnosis.   Medical Data Reviewed:  1.Data reviewed include clinical labs, radiology,  Medical Tests;   2.Tests results discussed w/performing or interpreting physician;   3.Obtaining/reviewing old medical records;  4.Obtaining case history from another source;  5.Independent review of image, tracing or specimen.    Patient was informed the Neurology Consult would happen via TeleHealth consult by way of interactive audio and video telecommunications, and consented to receiving care in this manner.

## 2018-06-19 NOTE — ED Triage Notes (Signed)
Patient stated around 5 pm, she start having blurred vision, slurred speech and drooling from her mouth, pain in back of head, numbing and tingling to left side of face.  Was diagnoses with stroke like symptoms in July 2019.

## 2018-06-20 ENCOUNTER — Encounter (HOSPITAL_COMMUNITY): Payer: Self-pay | Admitting: Internal Medicine

## 2018-06-20 ENCOUNTER — Observation Stay (HOSPITAL_COMMUNITY): Payer: Self-pay

## 2018-06-20 DIAGNOSIS — R531 Weakness: Secondary | ICD-10-CM

## 2018-06-20 DIAGNOSIS — G459 Transient cerebral ischemic attack, unspecified: Secondary | ICD-10-CM

## 2018-06-20 DIAGNOSIS — G43009 Migraine without aura, not intractable, without status migrainosus: Secondary | ICD-10-CM

## 2018-06-20 DIAGNOSIS — J449 Chronic obstructive pulmonary disease, unspecified: Secondary | ICD-10-CM

## 2018-06-20 DIAGNOSIS — K219 Gastro-esophageal reflux disease without esophagitis: Secondary | ICD-10-CM

## 2018-06-20 DIAGNOSIS — F419 Anxiety disorder, unspecified: Secondary | ICD-10-CM

## 2018-06-20 DIAGNOSIS — M1990 Unspecified osteoarthritis, unspecified site: Secondary | ICD-10-CM

## 2018-06-20 LAB — ETHANOL

## 2018-06-20 LAB — COMPREHENSIVE METABOLIC PANEL
ALBUMIN: 4.6 g/dL (ref 3.5–5.0)
ALT: 20 U/L (ref 0–44)
ANION GAP: 8 (ref 5–15)
AST: 25 U/L (ref 15–41)
Alkaline Phosphatase: 79 U/L (ref 38–126)
BILIRUBIN TOTAL: 0.5 mg/dL (ref 0.3–1.2)
BUN: 10 mg/dL (ref 6–20)
CO2: 29 mmol/L (ref 22–32)
Calcium: 9.7 mg/dL (ref 8.9–10.3)
Chloride: 101 mmol/L (ref 98–111)
Creatinine, Ser: 0.87 mg/dL (ref 0.44–1.00)
GFR calc Af Amer: >60 mL/min (ref >60)
GFR calc non Af Amer: >60 mL/min (ref >60)
Glucose, Bld: 97 mg/dL (ref 70–99)
POTASSIUM: 4.4 mmol/L (ref 3.5–5.1)
SODIUM: 138 mmol/L (ref 135–145)
Total Protein: 8 g/dL (ref 6.5–8.1)

## 2018-06-20 LAB — PHOSPHORUS: PHOSPHORUS: 3.7 mg/dL (ref 2.5–4.6)

## 2018-06-20 LAB — TROPONIN I: Troponin I: <0.03 ng/mL (ref <0.03)

## 2018-06-20 LAB — MAGNESIUM: MAGNESIUM: 2.1 mg/dL (ref 1.7–2.4)

## 2018-06-20 MED ORDER — ALBUTEROL SULFATE (2.5 MG/3ML) 0.083% IN NEBU: 2.5000 mg | INHALATION_SOLUTION | Freq: Four times a day (QID) | RESPIRATORY_TRACT | Status: DC | PRN

## 2018-06-20 MED ORDER — MELOXICAM 7.5 MG PO TABS: 7.5000 mg | ORAL_TABLET | Freq: Two times a day (BID) | ORAL | Status: DC | PRN

## 2018-06-20 MED ORDER — METOCLOPRAMIDE HCL 5 MG/ML IJ SOLN
10.0000 mg | Freq: Once | INTRAMUSCULAR | Status: AC
  Administered 2018-06-20: 10 mg via INTRAVENOUS
  Filled 2018-06-20: qty 2

## 2018-06-20 MED ORDER — FENTANYL CITRATE (PF) 100 MCG/2ML IJ SOLN
100.0000 ug | Freq: Once | INTRAMUSCULAR | Status: AC
  Administered 2018-06-20: 100 ug via INTRAVENOUS
  Filled 2018-06-20: qty 2

## 2018-06-20 MED ORDER — PANTOPRAZOLE SODIUM 40 MG PO TBEC
40.0000 mg | DELAYED_RELEASE_TABLET | Freq: Every day | ORAL | Status: DC
  Administered 2018-06-20: 40 mg via ORAL
  Filled 2018-06-20: qty 1

## 2018-06-20 MED ORDER — ONDANSETRON HCL 4 MG/2ML IJ SOLN: 4.0000 mg | Freq: Four times a day (QID) | INTRAMUSCULAR | Status: DC | PRN

## 2018-06-20 MED ORDER — HEPARIN SODIUM (PORCINE) 5000 UNIT/ML IJ SOLN: 5000.0000 [IU] | Freq: Three times a day (TID) | INTRAMUSCULAR | Status: DC

## 2018-06-20 MED ORDER — KETOROLAC TROMETHAMINE 30 MG/ML IJ SOLN
30.0000 mg | Freq: Once | INTRAMUSCULAR | Status: AC
  Administered 2018-06-20: 30 mg via INTRAVENOUS
  Filled 2018-06-20: qty 1

## 2018-06-20 MED ORDER — DIPHENHYDRAMINE HCL 50 MG/ML IJ SOLN
25.0000 mg | Freq: Once | INTRAMUSCULAR | Status: AC
  Administered 2018-06-20: 25 mg via INTRAVENOUS
  Filled 2018-06-20: qty 1

## 2018-06-20 MED ORDER — ACETAMINOPHEN 650 MG RE SUPP: 650.0000 mg | Freq: Four times a day (QID) | RECTAL | Status: DC | PRN

## 2018-06-20 MED ORDER — PAROXETINE HCL 20 MG PO TABS
40.0000 mg | ORAL_TABLET | Freq: Every day | ORAL | Status: DC
  Administered 2018-06-20: 40 mg via ORAL
  Filled 2018-06-20: qty 2

## 2018-06-20 MED ORDER — ATORVASTATIN CALCIUM 40 MG PO TABS
40.0000 mg | ORAL_TABLET | Freq: Every day | ORAL | Status: DC
  Administered 2018-06-20: 40 mg via ORAL
  Filled 2018-06-20: qty 1

## 2018-06-20 MED ORDER — FLUTICASONE PROPIONATE 50 MCG/ACT NA SUSP
2.0000 | Freq: Three times a day (TID) | NASAL | Status: DC
  Filled 2018-06-20: qty 16

## 2018-06-20 MED ORDER — ONDANSETRON HCL 4 MG PO TABS: 4.0000 mg | ORAL_TABLET | Freq: Four times a day (QID) | ORAL | Status: DC | PRN

## 2018-06-20 MED ORDER — PROMETHAZINE HCL 25 MG PO TABS: 25.0000 mg | ORAL_TABLET | Freq: Four times a day (QID) | ORAL | Status: DC | PRN

## 2018-06-20 MED ORDER — ASPIRIN EC 81 MG PO TBEC
81.0000 mg | DELAYED_RELEASE_TABLET | Freq: Every day | ORAL | Status: DC
  Administered 2018-06-20: 81 mg via ORAL
  Filled 2018-06-20: qty 1

## 2018-06-20 MED ORDER — ACETAMINOPHEN 325 MG PO TABS: 650.0000 mg | ORAL_TABLET | Freq: Four times a day (QID) | ORAL | Status: DC | PRN

## 2018-06-20 MED ORDER — ALPRAZOLAM 0.5 MG PO TABS
1.0000 mg | ORAL_TABLET | Freq: Four times a day (QID) | ORAL | Status: DC | PRN
  Administered 2018-06-20: 1 mg via ORAL
  Filled 2018-06-20 (×2): qty 2

## 2018-06-20 MED ORDER — STROKE: EARLY STAGES OF RECOVERY BOOK
Freq: Once | Status: DC
  Filled 2018-06-20: qty 1

## 2018-06-20 MED ORDER — OXYCODONE HCL 5 MG PO TABS
30.0000 mg | ORAL_TABLET | ORAL | Status: DC | PRN
  Administered 2018-06-20 (×2): 30 mg via ORAL
  Filled 2018-06-20 (×2): qty 6

## 2018-06-20 NOTE — Evaluation (Signed)
Physical Therapy Evaluation & Discharge Patient Details Name: Sharon Gay MRN: [id number] DOB: [birth year] Today's Date: 06/20/2018   History of Present Illness  Pt is a 52 y/o female admitted secondary to blurred vision, weakness and slurred speech. CT was negative, MRI is pending. PMH including but not limited to hx of MS, COPD and migraines.  Clinical Impression  Pt presented sitting EOB, awake and willing to participate in therapy session. Prior to admission, pt reported that she was independent with all functional mobility and ADLs. Pt lives with her spouse and five dogs in a single level home with several steps to enter. She is currently at mod I to independent with all functional mobility without an AD. She successfully completed stair training as well with no difficulties. Pt participated in a higher level balance assessment and scored a 23/24 on the DGI, indicating that she is a safe community ambulator. No further acute PT needs identified at this time. PT signing off.     Follow Up Recommendations No PT follow up    Equipment Recommendations  None recommended by PT    Recommendations for Other Services       Precautions / Restrictions Precautions Precautions: None Restrictions Weight Bearing Restrictions: No      Mobility  Bed Mobility               General bed mobility comments: sitting EOB upon arrival  Transfers Overall transfer level: Independent Equipment used: None                Ambulation/Gait Ambulation/Gait assistance: Independent Gait Distance (Feet): 300 Feet Assistive device: None Gait Pattern/deviations: WFL(Within Functional Limits) Gait velocity: WFL   General Gait Details: no instability or LOB  Stairs Stairs: Yes Stairs assistance: Modified independent (Device/Increase time) Stair Management: One rail Left;Alternating pattern;Step to pattern;Forwards Number of Stairs: 2(2 steps x1 and 3 half steps x1) General stair  comments: no instability or LOB, supervision for safety  Wheelchair Mobility    Modified Rankin (Stroke Patients Only) Modified Rankin (Stroke Patients Only) Pre-Morbid Rankin Score: No symptoms Modified Rankin: No significant disability     Balance Overall balance assessment: Needs assistance Sitting-balance support: Feet supported Sitting balance-Leahy Scale: Normal     Standing balance support: No upper extremity supported Standing balance-Leahy Scale: Good                   Standardized Balance Assessment Standardized Balance Assessment : Dynamic Gait Index   Dynamic Gait Index Level Surface: Normal Change in Gait Speed: Normal Gait with Horizontal Head Turns: Normal Gait with Vertical Head Turns: Normal Gait and Pivot Turn: Normal Step Over Obstacle: Normal Step Around Obstacles: Normal Steps: Mild Impairment Total Score: 23       Pertinent Vitals/Pain Pain Assessment: Faces Faces Pain Scale: Hurts a little bit Pain Location: headache Pain Descriptors / Indicators: Headache Pain Intervention(s): Monitored during session    Home Living Family/patient expects to be discharged to:: Private residence Living Arrangements: Spouse/significant other Available Help at Discharge: Family   Home Access: Stairs to enter Entrance Stairs-Rails: Left Entrance Stairs-Number of Steps: 8 Home Layout: One level Home Equipment: Cane - single point;Crutches;Walker - 2 wheels Additional Comments: x5 dogs all toy poodles. walks 1.5 miles every morning with neighbor    Prior Function Level of Independence: Independent               Hand Dominance        Extremity/Trunk Assessment   Upper Extremity  Assessment Upper Extremity Assessment: Overall WFL for tasks assessed    Lower Extremity Assessment Lower Extremity Assessment: Overall WFL for tasks assessed    Cervical / Trunk Assessment Cervical / Trunk Assessment: Normal  Communication    Communication: No difficulties  Cognition Arousal/Alertness: Awake/alert Behavior During Therapy: WFL for tasks assessed/performed Overall Cognitive Status: Within Functional Limits for tasks assessed                                        General Comments      Exercises     Assessment/Plan    PT Assessment Patent does not need any further PT services  PT Problem List         PT Treatment Interventions      PT Goals (Current goals can be found in the Care Plan section)  Acute Rehab PT Goals Patient Stated Goal: return home ASAP    Frequency     Barriers to discharge        Co-evaluation               AM-PAC PT "6 Clicks" Daily Activity  Outcome Measure Difficulty turning over in bed (including adjusting bedclothes, sheets and blankets)?: None Difficulty moving from lying on back to sitting on the side of the bed? : None Difficulty sitting down on and standing up from a chair with arms (e.g., wheelchair, bedside commode, etc,.)?: None Help needed moving to and from a bed to chair (including a wheelchair)?: None Help needed walking in hospital room?: None Help needed climbing 3-5 steps with a railing? : None 6 Click Score: 24    End of Session   Activity Tolerance: Patient tolerated treatment well Patient left: with call bell/phone within reach;Other (comment)(sitting EOB) Nurse Communication: Mobility status PT Visit Diagnosis: Other symptoms and signs involving the nervous system (M21.117)    Time: 3567-0141 PT Time Calculation (min) (ACUTE ONLY): 19 min   Charges:   PT Evaluation $PT Eval Moderate Complexity: 1 Mod          Deborah Chalk, PT, DPT  Acute Rehabilitation Services Pager [phone] Office [phone]    Alessandra Bevels Beatriz Quintela 06/20/2018, 11:54 AM

## 2018-06-20 NOTE — Progress Notes (Signed)
Code stroke  Call time  None Beeper   2320 Started   2325 Ended   2333 Soc   2333 Rad   2333 Epic   2333

## 2018-06-20 NOTE — Progress Notes (Signed)
Per discharge orders, escorted p.t. To main entrance via wheelchair for pickup.

## 2018-06-20 NOTE — Progress Notes (Signed)
OT Cancellation Note  Patient Details Name: Sharon Gay MRN: [id number] DOB: 1966-05-20   Cancelled Treatment:    Reason Eval/Treat Not Completed: Patient at procedure or test/ unavailable  [facility], OT/L   Acute OT Clinical Specialist Acute Rehabilitation Services Pager [phone] Office [phone]  06/20/2018, 11:52 AM

## 2018-06-20 NOTE — Progress Notes (Signed)
NURSING PROGRESS NOTE  Sharon Gay [id number] Discharge Data: 06/20/2018 4:45 PM Attending Provider: Coralie Keens NWG:[id number], [name], MD     [place] to be D/C'd Home per MD order.  Discussed with the patient the After Visit Summary and all questions fully answered. All IV's discontinued with no bleeding noted. All belongings returned to patient for patient to take home.   Last Vital Signs:  Blood pressure (!) 142/56, pulse (!) 51, temperature 98 F (36.7 C), temperature source Oral, resp. rate 18, height 5\' 11"  (1.803 m), weight 62.1 kg, SpO2 98 %.  Discharge Medication List Allergies as of 06/20/2018      Reactions   Penicillins Anaphylaxis, Nausea And Vomiting, Swelling   Has patient had a PCN reaction causing immediate rash, facial/tongue/throat swelling, SOB or lightheadedness with hypotension: No Has patient had a PCN reaction causing severe rash involving mucus membranes or skin necrosis: No Has patient had a PCN reaction that required hospitalization No Has patient had a PCN reaction occurring within the last 10 years: No If all of the above answers are "NO", then may proceed with Cephalosporin use.   Morphine Other (See Comments)   Hallucinations   Rifampin Nausea Only, Other (See Comments)   Blisters around lips and tongue Discolored orange urine and yellowed skin expected with Rifampin, not adverse reaction   Topamax [topiramate] Other (See Comments)   Headache and confusion.   Cymbalta [duloxetine Hcl] Other (See Comments)   Dizziness   Tequin [gatifloxacin] Nausea And Vomiting      Medication List    STOP taking these medications   oxyCODONE-acetaminophen 10-325 MG tablet Commonly known as:  PERCOCET     TAKE these medications   albuterol 108 (90 Base) MCG/ACT inhaler Commonly known as:  PROVENTIL HFA;VENTOLIN HFA Inhale 1-2 puffs into the lungs every 6 (six) hours as needed for wheezing or shortness of breath.   ALPRAZolam 1 MG  tablet Commonly known as:  XANAX Take 1 mg by mouth 4 (four) times daily as needed for anxiety.   aspirin EC 81 MG tablet Take 81 mg by mouth daily.   atorvastatin 40 MG tablet Commonly known as:  LIPITOR Take 1 tablet (40 mg total) by mouth daily.   benzonatate 100 MG capsule Commonly known as:  TESSALON Take 100 mg by mouth 3 (three) times daily as needed for cough.   dexlansoprazole 60 MG capsule Commonly known as:  DEXILANT Take 60 mg by mouth daily.   fluticasone 50 MCG/ACT nasal spray Commonly known as:  FLONASE Place 2 sprays into both nostrils 3 (three) times daily.   lidocaine 5 % ointment Commonly known as:  XYLOCAINE Apply 1 application topically daily as needed.   meloxicam 7.5 MG tablet Commonly known as:  MOBIC Take 7.5 mg by mouth 2 (two) times daily as needed for pain.   oxycodone 30 MG immediate release tablet Commonly known as:  ROXICODONE Take 30-60 mg by mouth every 4 (four) hours as needed for pain.   PARoxetine 40 MG tablet Commonly known as:  PAXIL Take 40 mg by mouth daily.   promethazine 25 MG tablet Commonly known as:  PHENERGAN Take 25 mg by mouth every 6 (six) hours as needed for nausea or vomiting.   REDNESS RELIEF 0.012-0.25 % Soln Generic drug:  Naphazoline-Glycerin Apply 1-2 drops to eye daily as needed (for allergies).   SIMPLY SALINE 0.9 % Aers Generic drug:  Saline Place 1-2 sprays into the nose daily as needed (for  nasal congestion).   Vitamin D3 50000 units Caps Take 1 capsule by mouth every Monday.

## 2018-06-20 NOTE — H&P (Signed)
History and Physical    Sharon Gay WUJ:[id number] DOB: [birth year] DOA: 06/19/2018  PCP: Redmond School, MD   Patient coming from: Home.  I have personally briefly reviewed patient's old medical records in [place]  Chief Complaint: Blurred vision, slurred speech and mouth drooling.  HPI: Sharon Gay is a 52 y.o. female with medical history significant of AC joint bone spurs, anxiety, depression, osteoarthritis, chronic back pain, history of colitis, COPD, GERD, hiatal hernia, history of pneumonia, hyperlipidemia, migraine headaches, mitral valve prolapse, multiple sclerosis, history of MAC colonization, former smoker quit about 6 months ago who is coming to the emergency department with complaints of developing sudden onset of blurred vision, slurred speech, mouth drooling, headache numbness and tingling of the left side of her face while she was with her brother receiving chemotherapy. She had a similar episode back in July and had extensive work-up. While we were examining her, the patient developed chest pain with unclear characteristic, radiated to her neck, associated with mild dyspnea and palpitations.  She denies fever, sore throat, wheezing, hemoptysis, PND, orthopnea or recent pitting edema of the lower extremities.  She gets occasionally nauseous, denies current abdominal pain, emesis, diarrhea, constipation, melena or hematochezia.  She denies dysuria or frequency.  No polyuria, polydipsia or blurred vision.  ED Course: Initial vital signs temperature 98.1 F, pulse 77, respirations 18, blood pressure 160/73 mmHg and O2 sat 100% on room air.  The patient received fentanyl 50 mcg plus ondansetron 4 mg IVP at 2330 without significant results.  After being seen by tele-neurology, a cocktail of Benadryl 25 mg plus Toradol 30 mg plus metoclopramide 10 mg IVP was not very effective for her headache.  Tele-neurology recommended MRI.  Her work-up which includes CBC, CMP,  PT/INR/PTT, troponin, alcohol level phosphorus and magnesium are completely normal.  CTA head and neck did not show any acute pathology.  Review of Systems: As per HPI otherwise 10 point review of systems negative.   Past Medical History:  Diagnosis Date  . AC (acromioclavicular) joint bone spurs   . Anxiety   . Arthritis   . Back pain   . Chest pain 2003   11/2007: Admission with unstable angina; normal coronary angiography  with [facility]; nl EF  . Chronic pain   . Colitis 2005  . COPD (chronic obstructive pulmonary disease) ([place])    11/2003: Normal PFTs ex minimal increased volumes and decreased the DLCO; Followed in Pulmonary clinic/ [facility]/ [place]   . Depression   . Gastroesophageal reflux disease 2009   Hiatal hernia; esophageal dilatation for Schatzki's ring in 04/2008; chronic constipation; irritable bowel syndrome  . History of pneumonia   . Hyperlipidemia 2009   Lipid profile 07/2012:227, 91, 56, 153  . Lung disease 2010  . Migraines   . Mitral valve prolapse   . Mitral valve prolapse   . MS (multiple sclerosis) ([place])   . MS (multiple sclerosis) ([place]) 2001  . 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 PONV (postoperative nausea and vomiting)   . Postmenopausal 10/31/2015  . Smoker 10/31/2015    Past Surgical History:  Procedure Laterality Date  . COLONOSCOPY W/ POLYPECTOMY  2006   Dr. Gala Romney: 3 diminutive polyps in descending colon, unknown path   . ESOPHAGOGASTRODUODENOSCOPY  2009   Dr. Gala Romney: normal esophagus, small hiatal hernia, s/p empiric dilatilon   . ESOPHAGOGASTRODUODENOSCOPY  2003   Schatzki's ring s/p dilation  .  FIBEROPTIC BRONCHOSCOPY  2010  . LIVER BIOPSY    . ORIF RADIAL FRACTURE  2012   Left  . VOCAL CORD POLYP REMOVED  2003     reports that she quit smoking about 6 months ago. Her smoking use included cigarettes. She has a 15.00 pack-year smoking history. She has never  used smokeless tobacco. She reports that she does not drink alcohol or use drugs.  Allergies  Allergen Reactions  . Penicillins Anaphylaxis, Nausea And Vomiting and Swelling    Has patient had a PCN reaction causing immediate rash, facial/tongue/throat swelling, SOB or lightheadedness with hypotension: No Has patient had a PCN reaction causing severe rash involving mucus membranes or skin necrosis: No Has patient had a PCN reaction that required hospitalization No Has patient had a PCN reaction occurring within the last 10 years: No If all of the above answers are "NO", then may proceed with Cephalosporin use.   Marland Kitchen Morphine Other (See Comments)    Hallucinations  . Rifampin Nausea Only and Other (See Comments)    Blisters around lips and tongue Discolored orange urine and yellowed skin expected with Rifampin, not adverse reaction  . Topamax [Topiramate] Other (See Comments)    Headache and confusion.  Marland Kitchen Cymbalta [Duloxetine Hcl] Other (See Comments)    Dizziness   . Tequin [Gatifloxacin] Nausea And Vomiting    Family History  Problem Relation Age of Onset  . Emphysema Maternal Grandfather        was a smoker  . Arthritis Sister   . Mental retardation Sister   . Heart disease Sister        Also brother; cardiomyopathy  . Thyroid disease Sister   . Fibromyalgia Sister   . Arthritis Sister   . Migraines Sister   . COPD Sister   . Heart attack Father 30  . Arthritis Sister   . Migraines Sister   . Other Sister        brain tumor  . Arthritis Brother   . Arthritis Brother   . Colon cancer Neg Hx    Prior to Admission medications   Medication Sig Start Date End Date Taking? Authorizing Provider  albuterol (PROVENTIL HFA;VENTOLIN HFA) 108 (90 BASE) MCG/ACT inhaler Inhale 1-2 puffs into the lungs every 6 (six) hours as needed for wheezing or shortness of breath. 08/25/15  Yes Lily Kocher, PA-C  ALPRAZolam Duanne Moron) 1 MG tablet Take 1 mg by mouth 4 (four) times daily as needed  for anxiety.   Yes [provider]  aspirin EC 81 MG tablet Take 81 mg by mouth daily.   Yes [provider]  atorvastatin (LIPITOR) 40 MG tablet Take 1 tablet (40 mg total) by mouth daily. 04/01/18 04/01/19 Yes Gherghe, Vella Redhead, MD  benzonatate (TESSALON) 100 MG capsule Take 100 mg by mouth 3 (three) times daily as needed for cough.  12/01/15  Yes [provider]  Cholecalciferol (VITAMIN D3) 50000 units CAPS Take 1 capsule by mouth every Monday.    Yes [provider]  dexlansoprazole (DEXILANT) 60 MG capsule Take 60 mg by mouth daily.   Yes [provider]  fluticasone (FLONASE) 50 MCG/ACT nasal spray Place 2 sprays into both nostrils 3 (three) times daily.   Yes [provider]  lidocaine (XYLOCAINE) 5 % ointment Apply 1 application topically daily as needed. 11/28/15  Yes [provider]  meloxicam (MOBIC) 7.5 MG tablet Take 7.5 mg by mouth 2 (two) times daily as needed for pain.  Yes [provider]  Naphazoline-Glycerin (REDNESS RELIEF) 0.012-0.25 % SOLN Apply 1-2 drops to eye daily as needed (for allergies).   Yes [provider]  oxycodone (ROXICODONE) 30 MG immediate release tablet Take 30-60 mg by mouth every 4 (four) hours as needed for pain.   Yes [provider]  oxyCODONE-acetaminophen (PERCOCET) 10-325 MG tablet 1 tablet every 4 (four) hours as needed.  10/20/15  Yes [provider]  PARoxetine (PAXIL) 40 MG tablet Take 40 mg by mouth daily.    Yes [provider]  promethazine (PHENERGAN) 25 MG tablet Take 25 mg by mouth every 6 (six) hours as needed for nausea or vomiting.   Yes [provider]  Saline (SIMPLY SALINE) 0.9 % AERS Place 1-2 sprays into the nose daily as needed (for nasal congestion).    [provider]    Physical Exam: Vitals:   06/20/18 0115 06/20/18 0130 06/20/18 0145 06/20/18 0200  BP:  111/69  98/72  Pulse: (!) 55 (!) 56 (!) 59 62    Resp: _0 Temp:      TempSrc:      SpO2: 98% 98% 99% 100%  Weight:      Height:        Constitutional: NAD, calm, comfortable Eyes: PERRL, lids and conjunctivae normal ENMT: Mucous membranes are moist. Posterior pharynx clear of any exudate or lesions. Neck: normal, supple, no masses, no thyromegaly Respiratory: clear to auscultation bilaterally, no wheezing, no crackles. Normal respiratory effort. No accessory muscle use.  Cardiovascular: Regular rate and rhythm, no murmurs / rubs / gallops. No extremity edema. 2+ pedal pulses. No carotid bruits.  Abdomen: Soft, no tenderness, no masses palpated. No hepatosplenomegaly. Bowel sounds positive.  Musculoskeletal: no clubbing / cyanosis. No joint deformity upper and lower extremities. Good ROM, no contractures. Normal muscle tone.  Skin: no rashes, lesions, ulcers. No induration Neurologic: CN 2-12 grossly intact. Sensation is reported to be decreased distally on lower extremities.  DTR were brisk. Strength 4.5/5 left-sided hemiparesis. Psychiatric: Normal judgment and insight. Alert and oriented x 3. Normal mood.   Labs on Admission: I have personally reviewed following labs and imaging studies  CBC: Recent Labs  Lab 06/19/18 2243 06/19/18 2337  WBC 8.2  --   NEUTROABS 4.1  --   HGB 14.6 15.3*  HCT 43.7 45.0  MCV 95.8  --   PLT 331  --    Basic Metabolic Panel: Recent Labs  Lab 06/19/18 2243 06/19/18 2337  NA 138 137  K 4.4 4.6  CL 101 100  CO2 29  --   GLUCOSE 97 91  BUN 10 10  CREATININE 0.87 0.90  CALCIUM 9.7  --   MG 2.1  --   PHOS 3.7  --    GFR: Estimated Creatinine Clearance: 71.7 mL/min (by C-G formula based on SCr of 0.9 mg/dL). Liver Function Tests: Recent Labs  Lab 06/19/18 2243  AST 25  ALT 20  ALKPHOS 79  BILITOT 0.5  PROT 8.0  ALBUMIN 4.6   No results for input(s): LIPASE, AMYLASE in the last 168 hours. No results for input(s): AMMONIA in the last 168 hours. Coagulation  Profile: Recent Labs  Lab 06/19/18 2243  INR 0.92   Cardiac Enzymes: No results for input(s): CKTOTAL, CKMB, CKMBINDEX, TROPONINI in the last 168 hours. BNP (last 3 results) No results for input(s): PROBNP in the last 8760 hours. HbA1C: No results for input(s): HGBA1C in the last 72 hours. CBG:  No results for input(s): GLUCAP in the last 168 hours. Lipid Profile: No results for input(s): CHOL, HDL, LDLCALC, TRIG, CHOLHDL, LDLDIRECT in the last 72 hours. Thyroid Function Tests: No results for input(s): TSH, T4TOTAL, FREET4, T3FREE, THYROIDAB in the last 72 hours. Anemia Panel: No results for input(s): VITAMINB12, FOLATE, FERRITIN, TIBC, IRON, RETICCTPCT in the last 72 hours. Urine analysis:    Component Value Date/Time   COLORURINE YELLOW 09/08/2016 1118   APPEARANCEUR CLEAR 09/08/2016 1118   LABSPEC 1.025 09/08/2016 1118   PHURINE 5.5 09/08/2016 [place] 09/08/2016 1118   HGBUR NEGATIVE 09/08/2016 1118   BILIRUBINUR NEGATIVE 09/08/2016 1118   KETONESUR NEGATIVE 09/08/2016 1118   PROTEINUR NEGATIVE 09/08/2016 1118   NITRITE NEGATIVE 09/08/2016 1118   LEUKOCYTESUR NEGATIVE 09/08/2016 1118    Radiological Exams on Admission: Ct Angio Head W Or Wo Contrast  Result Date: 06/19/2018 CLINICAL DATA:  Slurred speech. Numbness and tingling of the left face. EXAM: CT ANGIOGRAPHY HEAD AND NECK TECHNIQUE: Multidetector CT imaging of the head and neck was performed using the standard protocol during bolus administration of intravenous contrast. Multiplanar CT image reconstructions and MIPs were obtained to evaluate the vascular anatomy. Carotid stenosis measurements (when applicable) are obtained utilizing NASCET criteria, using the distal internal carotid diameter as the denominator. CONTRAST:  161m ISOVUE-370 IOPAMIDOL (ISOVUE-370) INJECTION 76% COMPARISON:  CTA head neck 03/31/2018. FINDINGS: CT HEAD FINDINGS Brain: There is no mass, hemorrhage or extra-axial collection.  The size and configuration of the ventricles and extra-axial CSF spaces are normal. Left lentiform nucleus prominent perivascular space, unchanged. The brain parenchyma is normal. Vascular: No abnormal hyperdensity of the major intracranial arteries or dural venous sinuses. No intracranial atherosclerosis. Skull: The visualized skull base, calvarium and extracranial soft tissues are normal. Sinuses/Orbits: No fluid levels or advanced mucosal thickening of the visualized paranasal sinuses. No mastoid or middle ear effusion. The orbits are normal. ASPECTS ([place] Early CT Score) - Ganglionic level infarction (caudate, lentiform nuclei, internal capsule, insula, M1-M3 cortex): 7 - Supraganglionic infarction (M4-M6 cortex): 3 Total score (0-10 with 10 being normal): 10 CTA NECK FINDINGS AORTIC ARCH: There is no calcific atherosclerosis of the aortic arch. There is no aneurysm, dissection or hemodynamically significant stenosis of the visualized ascending aorta and aortic arch. Conventional 3 vessel aortic branching pattern. The visualized proximal subclavian arteries are widely patent. RIGHT CAROTID SYSTEM: --Common carotid artery: Widely patent origin without common carotid artery dissection or aneurysm. --Internal carotid artery: No dissection, occlusion or aneurysm. No hemodynamically significant stenosis. --External carotid artery: No acute abnormality. LEFT CAROTID SYSTEM: --Common carotid artery: Widely patent origin without common carotid artery dissection or aneurysm. --Internal carotid artery:No dissection, occlusion or aneurysm. No hemodynamically significant stenosis. --External carotid artery: No acute abnormality. VERTEBRAL ARTERIES: Left dominant configuration. Both origins are normal. No dissection, occlusion or flow-limiting stenosis to the vertebrobasilar confluence. SKELETON: There is no bony spinal canal stenosis. No lytic or blastic lesion. OTHER NECK: Normal pharynx, larynx and major  salivary glands. No cervical lymphadenopathy. Unremarkable thyroid gland. UPPER CHEST: No pneumothorax or pleural effusion. No nodules or masses. CTA HEAD FINDINGS ANTERIOR CIRCULATION: --Intracranial internal carotid arteries: Normal. --Anterior cerebral arteries: Normal. Hypoplastic right A1 segment, normal variant. --Middle cerebral arteries: Normal. --Posterior communicating arteries: Absent bilaterally. POSTERIOR CIRCULATION: --Basilar artery: Normal. --Posterior cerebral arteries: Normal. --Superior cerebellar arteries: Normal. --Inferior cerebellar arteries: Normal anterior and posterior inferior cerebellar arteries. VENOUS SINUSES: As permitted by contrast timing, patent. ANATOMIC VARIANTS: None DELAYED PHASE: No parenchymal  contrast enhancement. Review of the MIP images confirms the above findings. IMPRESSION: 1. Normal CT/CTA of the head and neck. 2. ASPECTS is 10. These results were called by telephone at the time of interpretation on 06/19/2018 at 11:57 pm to Dr. Ezequiel Essex , who verbally acknowledged these results. Electronically Signed   By: Ulyses Jarred M.D.   On: 06/19/2018 23:57   Ct Angio Neck W Or Wo Contrast  Result Date: 06/19/2018 CLINICAL DATA:  Slurred speech. Numbness and tingling of the left face. EXAM: CT ANGIOGRAPHY HEAD AND NECK TECHNIQUE: Multidetector CT imaging of the head and neck was performed using the standard protocol during bolus administration of intravenous contrast. Multiplanar CT image reconstructions and MIPs were obtained to evaluate the vascular anatomy. Carotid stenosis measurements (when applicable) are obtained utilizing NASCET criteria, using the distal internal carotid diameter as the denominator. CONTRAST:  159m ISOVUE-370 IOPAMIDOL (ISOVUE-370) INJECTION 76% COMPARISON:  CTA head neck 03/31/2018. FINDINGS: CT HEAD FINDINGS Brain: There is no mass, hemorrhage or extra-axial collection. The size and configuration of the ventricles and extra-axial CSF  spaces are normal. Left lentiform nucleus prominent perivascular space, unchanged. The brain parenchyma is normal. Vascular: No abnormal hyperdensity of the major intracranial arteries or dural venous sinuses. No intracranial atherosclerosis. Skull: The visualized skull base, calvarium and extracranial soft tissues are normal. Sinuses/Orbits: No fluid levels or advanced mucosal thickening of the visualized paranasal sinuses. No mastoid or middle ear effusion. The orbits are normal. ASPECTS ([place] Early CT Score) - Ganglionic level infarction (caudate, lentiform nuclei, internal capsule, insula, M1-M3 cortex): 7 - Supraganglionic infarction (M4-M6 cortex): 3 Total score (0-10 with 10 being normal): 10 CTA NECK FINDINGS AORTIC ARCH: There is no calcific atherosclerosis of the aortic arch. There is no aneurysm, dissection or hemodynamically significant stenosis of the visualized ascending aorta and aortic arch. Conventional 3 vessel aortic branching pattern. The visualized proximal subclavian arteries are widely patent. RIGHT CAROTID SYSTEM: --Common carotid artery: Widely patent origin without common carotid artery dissection or aneurysm. --Internal carotid artery: No dissection, occlusion or aneurysm. No hemodynamically significant stenosis. --External carotid artery: No acute abnormality. LEFT CAROTID SYSTEM: --Common carotid artery: Widely patent origin without common carotid artery dissection or aneurysm. --Internal carotid artery:No dissection, occlusion or aneurysm. No hemodynamically significant stenosis. --External carotid artery: No acute abnormality. VERTEBRAL ARTERIES: Left dominant configuration. Both origins are normal. No dissection, occlusion or flow-limiting stenosis to the vertebrobasilar confluence. SKELETON: There is no bony spinal canal stenosis. No lytic or blastic lesion. OTHER NECK: Normal pharynx, larynx and major salivary glands. No cervical lymphadenopathy. Unremarkable  thyroid gland. UPPER CHEST: No pneumothorax or pleural effusion. No nodules or masses. CTA HEAD FINDINGS ANTERIOR CIRCULATION: --Intracranial internal carotid arteries: Normal. --Anterior cerebral arteries: Normal. Hypoplastic right A1 segment, normal variant. --Middle cerebral arteries: Normal. --Posterior communicating arteries: Absent bilaterally. POSTERIOR CIRCULATION: --Basilar artery: Normal. --Posterior cerebral arteries: Normal. --Superior cerebellar arteries: Normal. --Inferior cerebellar arteries: Normal anterior and posterior inferior cerebellar arteries. VENOUS SINUSES: As permitted by contrast timing, patent. ANATOMIC VARIANTS: None DELAYED PHASE: No parenchymal contrast enhancement. Review of the MIP images confirms the above findings. IMPRESSION: 1. Normal CT/CTA of the head and neck. 2. ASPECTS is 10. These results were called by telephone at the time of interpretation on 06/19/2018 at 11:57 pm to Dr. SEzequiel Essex, who verbally acknowledged these results. Electronically Signed   By: KUlyses JarredM.D.   On: 06/19/2018 23:57   Echo 03/2018 ------------------------------------------------------------------- LV EF: 55% -   60%  -------------------------------------------------------------------  Indications:      CVA 1.  ------------------------------------------------------------------- History:   PMH:   Chronic obstructive pulmonary disease.  Risk factors:  History of Multiple sclerosis. Current tobacco use.  ------------------------------------------------------------------- Study Conclusions  - Left ventricle: The cavity size was normal. Systolic function was   normal. The estimated ejection fraction was in the range of 55%   to 60%. Wall motion was normal; there were no regional wall   motion abnormalities. Left ventricular diastolic function   parameters were normal.  EKG: Independently reviewed.  Vent. rate 58 BPM PR interval * ms QRS duration 94 ms QT/QTc  445/438 ms P-R-T axes 69 -11 45 Sinus rhythm Consider right atrial enlargement Low voltage, precordial leads  Assessment/Plan Principal Problem:   TIA (transient ischemic attack) Could it an atypical migraine? The patient had a recent work-up in July. CTA head and neck normal tonight. However, will continue neuro checks. Transfer to [facility] to obtain MRI of brain.  Active Problems:   History of MS Per patient, she is scheduled to see neurology later this month in [place]. She has not followed up with neurology in about 15 years. Advised to establish and follow-up closely neurology.    Chest pain Trend troponin level. Serial EKGs. Normal echo in July of this year. However, multiple risk factors for CAD suggested outpatient stress test.    Back pain   Osteoarthritis   Migraine headaches The patient has been treated with high-dose oxycodone prescribed by her PCP Dr. Gerarda Fraction. [place] control prescription database OD score was 450/999.  However, she does not seem to be misusing, but advised to taper off, since this may be worsening her migraine headaches.  She should probably be referred to a pain specialist.    Anxiety/depression Continue paroxetine 40 mg p.o. daily. Continue alprazolam 1 mg 4 times daily as needed.    COPD (chronic obstructive pulmonary disease) (HCC) No signs of decompensation. Supplemental oxygen and bronchodilators as needed.    Gastroesophageal reflux disease Continue PPI.    Hyperlipidemia Continue atorvastatin 40 mg p.o. daily.   DVT prophylaxis: Heparin SQ. Code Status: Full code. Family Communication:  Disposition Plan: Observation for neuro checks and MRI brain. Consults called: Tele-neurology. Admission status: Observation/telemetry.   Reubin Milan MD [facility] Pager [phone].  If 7PM-7AM, please contact night-coverage [email or web address] Password [facility]  06/20/2018, 4:07 AM   This document was prepared using Dragon voice  recognition software may contain some unintended transcription errors.

## 2018-06-20 NOTE — Discharge Summary (Signed)
Physician Discharge Summary  Sharon Gay ZOX:[id number] DOB: [birth year] DOA: 06/19/2018  PCP: Elfredia Nevins, MD  Admit date: 06/19/2018 Discharge date: 06/20/2018  Admitted From: Home  Disposition:  Home   Recommendations for Outpatient Follow-up and new medication changes:  1. Follow up with Dr. Sherwood Gambler in 7 days.  2. Follow up with outpatient neurology as scheduled  Home Health: no   Equipment/Devices: no    Discharge Condition: stable  CODE STATUS: full  Diet recommendation: Reagular.   Brief/Interim Summary: 52 year old female who presented with blurry vision, slurred speech and mouth drooling.  She does have the significant medical history for anxiety, depression, osteoarthritis, COPD, GERD, dyslipidemia, migraine headaches, multiple sclerosis and mitral valve prolapse.  She had a sudden episode of blurred vision, slurred speech, mouth drooling, headaches and paresthesias on the left side of her face.  Similar symptoms in July, extensive work-up was negative.  On her initial physical examination temperature 98.1, heart rate 77, respiratory rate 18, blood pressure 160/73, oxygen saturation 100%.  Moist mucous membranes, lungs clear to auscultation bilaterally, heart S1-S2 present rhythmic, abdomen soft nontender, no lower extremity edema.  Reported decreased sensation on the lower extremities, 4.5 out of 5 strength on the left side.  Sodium 138, potassium 4.4, chloride 101, bicarb 29, glucose 97, BUN 10, creatinine 0.87, magnesium 2.1, white count 8.2, hemoglobin 14.6, hematocrit 43.7, platelets 331, alcohol level less than 10, CT/CTA of the head and neck were normal, EKG normal sinus rhythm, left axis deviation.  Patient was admitted to the hospital with a working diagnosis of focal neurologic deficit, rule out TIA, to rule out a atypical migraine.  1.  Atypical migraine.  Patient symptoms have improved with medical therapy, at home patient taking analgesics on a regular basis.   She does have an outpatient follow-up with neurology.  Inpatient work-up was negative for acute cerebrovascular accident, occluding brain MRI.  Patient was evaluated by Occupational Therapy and physical therapy with no further recommendations.  2.  History of multiple sclerosis.  Follow-up with outpatient neurology as scheduled.  3.  Osteoarthritis.  Continue home regimen of analgesics with meloxicam and oxycodone.   4.  Anxiety/depression.  Continue paroxetine and alprazolam.  5.  COPD with no acute exacerbation.  Continue as needed bronchodilators  6.  GERD.  Resume Dexilant for antiacid therapy.  7.  Dyslipidemia.  Continue atorvastatin.    Discharge Diagnoses:  Principal Problem:   TIA (transient ischemic attack) Active Problems:   Migraine headache   Anxiety   COPD (chronic obstructive pulmonary disease) (HCC)   Chest pain   Gastroesophageal reflux disease   Hyperlipidemia   History of multiple sclerosis   Arthritis    Discharge Instructions   Allergies as of 06/20/2018      Reactions   Penicillins Anaphylaxis, Nausea And Vomiting, Swelling   Has patient had a PCN reaction causing immediate rash, facial/tongue/throat swelling, SOB or lightheadedness with hypotension: No Has patient had a PCN reaction causing severe rash involving mucus membranes or skin necrosis: No Has patient had a PCN reaction that required hospitalization No Has patient had a PCN reaction occurring within the last 10 years: No If all of the above answers are "NO", then may proceed with Cephalosporin use.   Morphine Other (See Comments)   Hallucinations   Rifampin Nausea Only, Other (See Comments)   Blisters around lips and tongue Discolored orange urine and yellowed skin expected with Rifampin, not adverse reaction   Topamax [topiramate] Other (See Comments)  Headache and confusion.   Cymbalta [duloxetine Hcl] Other (See Comments)   Dizziness   Tequin [gatifloxacin] Nausea And Vomiting       Medication List    STOP taking these medications   oxyCODONE-acetaminophen 10-325 MG tablet Commonly known as:  PERCOCET     TAKE these medications   albuterol 108 (90 Base) MCG/ACT inhaler Commonly known as:  PROVENTIL HFA;VENTOLIN HFA Inhale 1-2 puffs into the lungs every 6 (six) hours as needed for wheezing or shortness of breath.   ALPRAZolam 1 MG tablet Commonly known as:  XANAX Take 1 mg by mouth 4 (four) times daily as needed for anxiety.   aspirin EC 81 MG tablet Take 81 mg by mouth daily.   atorvastatin 40 MG tablet Commonly known as:  LIPITOR Take 1 tablet (40 mg total) by mouth daily.   benzonatate 100 MG capsule Commonly known as:  TESSALON Take 100 mg by mouth 3 (three) times daily as needed for cough.   dexlansoprazole 60 MG capsule Commonly known as:  DEXILANT Take 60 mg by mouth daily.   fluticasone 50 MCG/ACT nasal spray Commonly known as:  FLONASE Place 2 sprays into both nostrils 3 (three) times daily.   lidocaine 5 % ointment Commonly known as:  XYLOCAINE Apply 1 application topically daily as needed.   meloxicam 7.5 MG tablet Commonly known as:  MOBIC Take 7.5 mg by mouth 2 (two) times daily as needed for pain.   oxycodone 30 MG immediate release tablet Commonly known as:  ROXICODONE Take 30-60 mg by mouth every 4 (four) hours as needed for pain.   PARoxetine 40 MG tablet Commonly known as:  PAXIL Take 40 mg by mouth daily.   promethazine 25 MG tablet Commonly known as:  PHENERGAN Take 25 mg by mouth every 6 (six) hours as needed for nausea or vomiting.   REDNESS RELIEF 0.012-0.25 % Soln Generic drug:  Naphazoline-Glycerin Apply 1-2 drops to eye daily as needed (for allergies).   SIMPLY SALINE 0.9 % Aers Generic drug:  Saline Place 1-2 sprays into the nose daily as needed (for nasal congestion).   Vitamin D3 50000 units Caps Take 1 capsule by mouth every Monday.       Allergies  Allergen Reactions  . Penicillins  Anaphylaxis, Nausea And Vomiting and Swelling    Has patient had a PCN reaction causing immediate rash, facial/tongue/throat swelling, SOB or lightheadedness with hypotension: No Has patient had a PCN reaction causing severe rash involving mucus membranes or skin necrosis: No Has patient had a PCN reaction that required hospitalization No Has patient had a PCN reaction occurring within the last 10 years: No If all of the above answers are "NO", then may proceed with Cephalosporin use.   Marland Kitchen Morphine Other (See Comments)    Hallucinations  . Rifampin Nausea Only and Other (See Comments)    Blisters around lips and tongue Discolored orange urine and yellowed skin expected with Rifampin, not adverse reaction  . Topamax [Topiramate] Other (See Comments)    Headache and confusion.  Marland Kitchen Cymbalta [Duloxetine Hcl] Other (See Comments)    Dizziness   . Tequin [Gatifloxacin] Nausea And Vomiting    Consultations:  Tele stroke team.    Procedures/Studies: Ct Angio Head W Or Wo Contrast  Result Date: 06/19/2018 CLINICAL DATA:  Slurred speech. Numbness and tingling of the left face. EXAM: CT ANGIOGRAPHY HEAD AND NECK TECHNIQUE: Multidetector CT imaging of the head and neck was performed using the standard protocol during bolus  administration of intravenous contrast. Multiplanar CT image reconstructions and MIPs were obtained to evaluate the vascular anatomy. Carotid stenosis measurements (when applicable) are obtained utilizing NASCET criteria, using the distal internal carotid diameter as the denominator. CONTRAST:  ISOVUE-370 IOPAMIDOL (ISOVUE-370) INJECTION 76% COMPARISON:  CTA head neck 03/31/2018. FINDINGS: CT HEAD FINDINGS Brain: There is no mass, hemorrhage or extra-axial collection. The size and configuration of the ventricles and extra-axial CSF spaces are normal. Left lentiform nucleus prominent perivascular space, unchanged. The brain parenchyma is normal. Vascular: No abnormal  hyperdensity of the major intracranial arteries or dural venous sinuses. No intracranial atherosclerosis. Skull: The visualized skull base, calvarium and extracranial soft tissues are normal. Sinuses/Orbits: No fluid levels or advanced mucosal thickening of the visualized paranasal sinuses. No mastoid or middle ear effusion. The orbits are normal. ASPECTS (Alberta Stroke Program Early CT Score) - Ganglionic level infarction (caudate, lentiform nuclei, internal capsule, insula, M1-M3 cortex): 7 - Supraganglionic infarction (M4-M6 cortex): 3 Total score (0-10 with 10 being normal): 10 CTA NECK FINDINGS AORTIC ARCH: There is no calcific atherosclerosis of the aortic arch. There is no aneurysm, dissection or hemodynamically significant stenosis of the visualized ascending aorta and aortic arch. Conventional 3 vessel aortic branching pattern. The visualized proximal subclavian arteries are widely patent. RIGHT CAROTID SYSTEM: --Common carotid artery: Widely patent origin without common carotid artery dissection or aneurysm. --Internal carotid artery: No dissection, occlusion or aneurysm. No hemodynamically significant stenosis. --External carotid artery: No acute abnormality. LEFT CAROTID SYSTEM: --Common carotid artery: Widely patent origin without common carotid artery dissection or aneurysm. --Internal carotid artery:No dissection, occlusion or aneurysm. No hemodynamically significant stenosis. --External carotid artery: No acute abnormality. VERTEBRAL ARTERIES: Left dominant configuration. Both origins are normal. No dissection, occlusion or flow-limiting stenosis to the vertebrobasilar confluence. SKELETON: There is no bony spinal canal stenosis. No lytic or blastic lesion. OTHER NECK: Normal pharynx, larynx and major salivary glands. No cervical lymphadenopathy. Unremarkable thyroid gland. UPPER CHEST: No pneumothorax or pleural effusion. No nodules or masses. CTA HEAD FINDINGS ANTERIOR CIRCULATION: --Intracranial  internal carotid arteries: Normal. --Anterior cerebral arteries: Normal. Hypoplastic right A1 segment, normal variant. --Middle cerebral arteries: Normal. --Posterior communicating arteries: Absent bilaterally. POSTERIOR CIRCULATION: --Basilar artery: Normal. --Posterior cerebral arteries: Normal. --Superior cerebellar arteries: Normal. --Inferior cerebellar arteries: Normal anterior and posterior inferior cerebellar arteries. VENOUS SINUSES: As permitted by contrast timing, patent. ANATOMIC VARIANTS: None DELAYED PHASE: No parenchymal contrast enhancement. Review of the MIP images confirms the above findings. IMPRESSION: 1. Normal CT/CTA of the head and neck. 2. ASPECTS is 10. These results were called by telephone at the time of interpretation on 06/19/2018 at 11:57 pm to Dr. Glynn Octave , who verbally acknowledged these results. Electronically Signed   By: Deatra Robinson M.D.   On: 06/19/2018 23:57   Ct Angio Neck W Or Wo Contrast  Result Date: 06/19/2018 CLINICAL DATA:  Slurred speech. Numbness and tingling of the left face. EXAM: CT ANGIOGRAPHY HEAD AND NECK TECHNIQUE: Multidetector CT imaging of the head and neck was performed using the standard protocol during bolus administration of intravenous contrast. Multiplanar CT image reconstructions and MIPs were obtained to evaluate the vascular anatomy. Carotid stenosis measurements (when applicable) are obtained utilizing NASCET criteria, using the distal internal carotid diameter as the denominator. CONTRAST:  ISOVUE-370 IOPAMIDOL (ISOVUE-370) INJECTION 76% COMPARISON:  CTA head neck 03/31/2018. FINDINGS: CT HEAD FINDINGS Brain: There is no mass, hemorrhage or extra-axial collection. The size and configuration of the ventricles and extra-axial CSF spaces are normal.  Left lentiform nucleus prominent perivascular space, unchanged. The brain parenchyma is normal. Vascular: No abnormal hyperdensity of the major intracranial arteries or dural venous  sinuses. No intracranial atherosclerosis. Skull: The visualized skull base, calvarium and extracranial soft tissues are normal. Sinuses/Orbits: No fluid levels or advanced mucosal thickening of the visualized paranasal sinuses. No mastoid or middle ear effusion. The orbits are normal. ASPECTS (Alberta Stroke Program Early CT Score) - Ganglionic level infarction (caudate, lentiform nuclei, internal capsule, insula, M1-M3 cortex): 7 - Supraganglionic infarction (M4-M6 cortex): 3 Total score (0-10 with 10 being normal): 10 CTA NECK FINDINGS AORTIC ARCH: There is no calcific atherosclerosis of the aortic arch. There is no aneurysm, dissection or hemodynamically significant stenosis of the visualized ascending aorta and aortic arch. Conventional 3 vessel aortic branching pattern. The visualized proximal subclavian arteries are widely patent. RIGHT CAROTID SYSTEM: --Common carotid artery: Widely patent origin without common carotid artery dissection or aneurysm. --Internal carotid artery: No dissection, occlusion or aneurysm. No hemodynamically significant stenosis. --External carotid artery: No acute abnormality. LEFT CAROTID SYSTEM: --Common carotid artery: Widely patent origin without common carotid artery dissection or aneurysm. --Internal carotid artery:No dissection, occlusion or aneurysm. No hemodynamically significant stenosis. --External carotid artery: No acute abnormality. VERTEBRAL ARTERIES: Left dominant configuration. Both origins are normal. No dissection, occlusion or flow-limiting stenosis to the vertebrobasilar confluence. SKELETON: There is no bony spinal canal stenosis. No lytic or blastic lesion. OTHER NECK: Normal pharynx, larynx and major salivary glands. No cervical lymphadenopathy. Unremarkable thyroid gland. UPPER CHEST: No pneumothorax or pleural effusion. No nodules or masses. CTA HEAD FINDINGS ANTERIOR CIRCULATION: --Intracranial internal carotid arteries: Normal. --Anterior cerebral arteries:  Normal. Hypoplastic right A1 segment, normal variant. --Middle cerebral arteries: Normal. --Posterior communicating arteries: Absent bilaterally. POSTERIOR CIRCULATION: --Basilar artery: Normal. --Posterior cerebral arteries: Normal. --Superior cerebellar arteries: Normal. --Inferior cerebellar arteries: Normal anterior and posterior inferior cerebellar arteries. VENOUS SINUSES: As permitted by contrast timing, patent. ANATOMIC VARIANTS: None DELAYED PHASE: No parenchymal contrast enhancement. Review of the MIP images confirms the above findings. IMPRESSION: 1. Normal CT/CTA of the head and neck. 2. ASPECTS is 10. These results were called by telephone at the time of interpretation on 06/19/2018 at 11:57 pm to Dr. Glynn Octave , who verbally acknowledged these results. Electronically Signed   By: Deatra Robinson M.D.   On: 06/19/2018 23:57   Mr Brain Wo Contrast  Result Date: 06/20/2018 CLINICAL DATA:  Sudden onset of blurred vision, slurred speech, headache, numbness and tingling left face. EXAM: MRI HEAD WITHOUT CONTRAST TECHNIQUE: Multiplanar, multiecho pulse sequences of the brain and surrounding structures were obtained without intravenous contrast. COMPARISON:  CT head and CTA head 06/19/2018 FINDINGS: Brain: No acute infarction, hemorrhage, hydrocephalus, extra-axial collection or mass lesion. Vascular: Normal arterial flow voids Skull and upper cervical spine: Negative Sinuses/Orbits: Mild mucosal edema paranasal sinuses.  Normal orbit Other: None IMPRESSION: Negative MRI head Electronically Signed   By: Marlan Palau M.D.   On: 06/20/2018 12:39       Subjective: Patient is feeling well, headache has improved as well as facial numbness and mouth drooling.   Discharge Exam: Vitals:   06/20/18 1030 06/20/18 1230  BP: 104/68 111/66  Pulse: (!) 52 (!) 47  Resp: 18 18  Temp:    SpO2: 97%    Vitals:   06/20/18 0448 06/20/18 0830 06/20/18 1030 06/20/18 1230  BP: 95/64 (!) 97/55 104/68  111/66  Pulse: (!) 47 (!) 47 (!) 52 (!) 47  Resp: 18 18 18  18  Temp: 97.9 F (36.6 C) (!) 97.5 F (36.4 C)    TempSrc: Oral Oral    SpO2: 100% 97% 97%   Weight:      Height:        General: Not in pain or dyspnea  Neurology: Awake and alert, non focal  E ENT: no pallor, no icterus, oral mucosa moist Cardiovascular: No JVD. S1-S2 present, rhythmic, no gallops, rubs, or murmurs. No lower extremity edema. Pulmonary: vesicular breath sounds bilaterally, adequate air movement, no wheezing, rhonchi or rales. Gastrointestinal. Abdomen with no organomegaly, non tender, no rebound or guarding Skin. No rashes Musculoskeletal: no joint deformities   The results of significant diagnostics from this hospitalization (including imaging, microbiology, ancillary and laboratory) are listed below for reference.     Microbiology: No results found for this or any previous visit (from the past 240 hour(s)).   Labs: BNP (last 3 results) No results for input(s): BNP in the last 8760 hours. Basic Metabolic Panel: Recent Labs  Lab 06/19/18 2243 06/19/18 2337  NA 138 137  K 4.4 4.6  CL 101 100  CO2 29  --   GLUCOSE 97 91  BUN 10 10  CREATININE 0.87 0.90  CALCIUM 9.7  --   MG 2.1  --   PHOS 3.7  --    Liver Function Tests: Recent Labs  Lab 06/19/18 2243  AST 25  ALT 20  ALKPHOS 79  BILITOT 0.5  PROT 8.0  ALBUMIN 4.6   No results for input(s): LIPASE, AMYLASE in the last 168 hours. No results for input(s): AMMONIA in the last 168 hours. CBC: Recent Labs  Lab 06/19/18 2243 06/19/18 2337  WBC 8.2  --   NEUTROABS 4.1  --   HGB 14.6 15.3*  HCT 43.7 45.0  MCV 95.8  --   PLT 331  --    Cardiac Enzymes: Recent Labs  Lab 06/20/18 0618 06/20/18 1110  TROPONINI <0.03 <0.03   BNP: Invalid input(s): POCBNP CBG: No results for input(s): GLUCAP in the last 168 hours. D-Dimer No results for input(s): DDIMER in the last 72 hours. Hgb A1c No results for input(s): HGBA1C in the  last 72 hours. Lipid Profile No results for input(s): CHOL, HDL, LDLCALC, TRIG, CHOLHDL, LDLDIRECT in the last 72 hours. Thyroid function studies No results for input(s): TSH, T4TOTAL, T3FREE, THYROIDAB in the last 72 hours.  Invalid input(s): FREET3 Anemia work up No results for input(s): VITAMINB12, FOLATE, FERRITIN, TIBC, IRON, RETICCTPCT in the last 72 hours. Urinalysis    Component Value Date/Time   COLORURINE YELLOW 09/08/2016 1118   APPEARANCEUR CLEAR 09/08/2016 1118   LABSPEC 1.025 09/08/2016 1118   PHURINE 5.5 09/08/2016 1118   GLUCOSEU NEGATIVE 09/08/2016 1118   HGBUR NEGATIVE 09/08/2016 1118   BILIRUBINUR NEGATIVE 09/08/2016 1118   KETONESUR NEGATIVE 09/08/2016 1118   PROTEINUR NEGATIVE 09/08/2016 1118   NITRITE NEGATIVE 09/08/2016 1118   LEUKOCYTESUR NEGATIVE 09/08/2016 1118   Sepsis Labs Invalid input(s): PROCALCITONIN,  WBC,  LACTICIDVEN Microbiology No results found for this or any previous visit (from the past 240 hour(s)).   Time coordinating discharge: 45 minutes  SIGNED:   Coralie Keens, MD  [facility] 06/20/2018, 1:18 PM Pager [phone]  If 7PM-7AM, please contact night-coverage [email or web address] Password [email or web address]

## 2018-06-20 NOTE — Progress Notes (Signed)
OT Evaluation  Pt functioning close to baseline level. Pt reports headache and asking for Fentanyl shot - nsg aware. No further OT needs. Pt safe to ambulate in room.     06/20/18 1200  OT Visit Information  Last OT Received On 06/20/18  Assistance Needed +1  History of Present Illness Pt is a 52 y/o female admitted secondary to blurred vision, weakness and slurred speech. CT was negative, MRI is pending. PMH including but not limited to hx of MS, COPD and migraines.  Precautions  Precautions None  Restrictions  Weight Bearing Restrictions No  Home Living  Family/patient expects to be discharged to: Private residence  Living Arrangements Spouse/significant other  Available Help at Discharge Family  Home Access Stairs to enter  Entrance Stairs-Number of Steps 8  Entrance Stairs-Rails Left  Home Layout One level  Bathroom Shower/Tub Walk-in Pension scheme manager Yes  How Accessible Accessible via walker  Home Equipment Cane - single point;Crutches;Walker - 2 wheels  Additional Comments x5 dogs all toy poodles. walks 1.5 miles every morning with neighbor  Prior Function  Level of Independence Independent  Comments does not work  Geneticist, molecular No difficulties  Pain Assessment  Pain Assessment Faces  Faces Pain Scale 2  Pain Location headache  Pain Descriptors / Indicators Headache  Pain Intervention(s) Limited activity within patient's tolerance;Patient requesting pain meds-RN notified;RN gave pain meds during session  Cognition  Arousal/Alertness Awake/alert  Behavior During Therapy [facility] for tasks assessed/performed  Overall Cognitive Status Within Functional Limits for tasks assessed  Upper Extremity Assessment  Upper Extremity Assessment Overall WFL for tasks assessed (mild L sided weakness; ? full effort)  Lower Extremity Assessment  Lower Extremity Assessment Defer to PT evaluation  Cervical / Trunk Assessment   Cervical / Trunk Assessment Normal  ADL  Overall ADL's  At baseline  Vision- History  Baseline Vision/History No visual deficits  Patient Visual Report Blurring of vision  Vision- Assessment  Additional Comments pt able to read menu and process information to order 3 meals  Perception  Comments [facility]  Praxis  Praxis tested? WFL  Bed Mobility  General bed mobility comments sitting EOB upon arrival  Transfers  Overall transfer level Independent  Equipment used None  Balance  Overall balance assessment Needs assistance  Sitting-balance support Feet supported  Sitting balance-Leahy Scale Normal  Standing balance support No upper extremity supported  Standing balance-Leahy Scale Good  OT - End of Session  Activity Tolerance Patient tolerated treatment well  Patient left in bed;with call bell/phone within reach;with nursing/sitter in room  Nurse Communication Mobility status  OT Assessment  OT Recommendation/Assessment Patient does not need any further OT services  OT Visit Diagnosis Unsteadiness on feet (R26.81);Pain  Pain - part of body  (head)  OT Problem List Pain  AM-PAC OT "6 Clicks" Daily Activity Outcome Measure  Help from another person eating meals? 4  Help from another person taking care of personal grooming? 4  Help from another person toileting, which includes using toliet, bedpan, or urinal? 4  Help from another person bathing (including washing, rinsing, drying)? 4  Help from another person to put on and taking off regular upper body clothing? 4  Help from another person to put on and taking off regular lower body clothing? 4  6 Click Score 24  ADL G Code Conversion CH  OT Recommendation  Follow Up Recommendations No OT follow up  OT Equipment None recommended by OT  Acute Rehab OT Goals  Patient Stated Goal return home ASAP  OT Goal Formulation All assessment and education complete, DC therapy  OT Time Calculation  OT Start Time (ACUTE ONLY) 1239  OT Stop  Time (ACUTE ONLY) 1253  OT Time Calculation (min) 14 min  OT General Charges  $OT Visit 1 Visit  OT Evaluation  $OT Eval Low Complexity 1 Low  Written Expression  Dominant Hand Right  Luisa Dago, OT/L   Acute OT Clinical Specialist Acute Rehabilitation Services Pager [phone] Office [phone]

## 2018-07-09 ENCOUNTER — Ambulatory Visit: Payer: MEDICAID | Admitting: Neurology

## 2018-09-03 ENCOUNTER — Other Ambulatory Visit: Payer: Self-pay

## 2018-09-03 ENCOUNTER — Emergency Department (HOSPITAL_COMMUNITY)
Admission: EM | Admit: 2018-09-03 | Discharge: 2018-09-03 | Disposition: A | Discharge status: Home / Self Care | Payer: Medicaid Other | Attending: Emergency Medicine | Admitting: Emergency Medicine

## 2018-09-03 ENCOUNTER — Encounter (HOSPITAL_COMMUNITY): Payer: Self-pay | Admitting: Emergency Medicine

## 2018-09-03 ENCOUNTER — Emergency Department (HOSPITAL_COMMUNITY): Payer: Medicaid Other

## 2018-09-03 DIAGNOSIS — G35 Multiple sclerosis: Secondary | ICD-10-CM | POA: Insufficient documentation

## 2018-09-03 DIAGNOSIS — Z7982 Long term (current) use of aspirin: Secondary | ICD-10-CM | POA: Insufficient documentation

## 2018-09-03 DIAGNOSIS — G43409 Hemiplegic migraine, not intractable, without status migrainosus: Secondary | ICD-10-CM | POA: Insufficient documentation

## 2018-09-03 DIAGNOSIS — Z87891 Personal history of nicotine dependence: Secondary | ICD-10-CM | POA: Insufficient documentation

## 2018-09-03 DIAGNOSIS — J449 Chronic obstructive pulmonary disease, unspecified: Secondary | ICD-10-CM | POA: Insufficient documentation

## 2018-09-03 DIAGNOSIS — Z79899 Other long term (current) drug therapy: Secondary | ICD-10-CM | POA: Insufficient documentation

## 2018-09-03 LAB — CBC
HEMATOCRIT: 47.3 % — AB (ref 36.0–46.0)
HEMOGLOBIN: 15.1 g/dL — AB (ref 12.0–15.0)
MCH: 30.9 pg (ref 26.0–34.0)
MCHC: 31.9 g/dL (ref 30.0–36.0)
MCV: 96.7 fL (ref 80.0–100.0)
Platelets: 330 10*3/uL (ref 150–400)
RBC: 4.89 MIL/uL (ref 3.87–5.11)
RDW: 13.8 % (ref 11.5–15.5)
WBC: 7.6 10*3/uL (ref 4.0–10.5)
nRBC: 0 % (ref 0.0–0.2)

## 2018-09-03 LAB — COMPREHENSIVE METABOLIC PANEL
ALBUMIN: 4.4 g/dL (ref 3.5–5.0)
ALT: 26 U/L (ref 0–44)
AST: 24 U/L (ref 15–41)
Alkaline Phosphatase: 64 U/L (ref 38–126)
Anion gap: 8 (ref 5–15)
BUN: 14 mg/dL (ref 6–20)
CO2: 26 mmol/L (ref 22–32)
Calcium: 9.8 mg/dL (ref 8.9–10.3)
Chloride: 105 mmol/L (ref 98–111)
Creatinine, Ser: 0.8 mg/dL (ref 0.44–1.00)
GFR calc Af Amer: >60 mL/min (ref >60)
GFR calc non Af Amer: >60 mL/min (ref >60)
Glucose, Bld: 98 mg/dL (ref 70–99)
Potassium: 4.5 mmol/L (ref 3.5–5.1)
Sodium: 139 mmol/L (ref 135–145)
Total Bilirubin: 0.4 mg/dL (ref 0.3–1.2)
Total Protein: 7.6 g/dL (ref 6.5–8.1)

## 2018-09-03 LAB — TROPONIN I: Troponin I: <0.03 ng/mL (ref <0.03)

## 2018-09-03 LAB — DIFFERENTIAL
Abs Immature Granulocytes: 0.01 10*3/uL (ref 0.00–0.07)
Basophils Absolute: 0.1 10*3/uL (ref 0.0–0.1)
Basophils Relative: 1 %
Eosinophils Absolute: 0.2 10*3/uL (ref 0.0–0.5)
Eosinophils Relative: 3 %
Immature Granulocytes: 0 %
LYMPHS PCT: 30 %
Lymphs Abs: 2.3 10*3/uL (ref 0.7–4.0)
Monocytes Absolute: 0.4 10*3/uL (ref 0.1–1.0)
Monocytes Relative: 5 %
NEUTROS ABS: 4.6 10*3/uL (ref 1.7–7.7)
Neutrophils Relative %: 61 %

## 2018-09-03 LAB — APTT: aPTT: 36 seconds (ref 24–36)

## 2018-09-03 LAB — PROTIME-INR
INR: 0.93
Prothrombin Time: 12.4 seconds (ref 11.4–15.2)

## 2018-09-03 MED ORDER — SODIUM CHLORIDE 0.9 % IV BOLUS
1000.0000 mL | Freq: Once | INTRAVENOUS | Status: AC
  Administered 2018-09-03: 1000 mL via INTRAVENOUS

## 2018-09-03 MED ORDER — DIPHENHYDRAMINE HCL 50 MG/ML IJ SOLN
25.0000 mg | Freq: Once | INTRAMUSCULAR | Status: AC
  Administered 2018-09-03: 25 mg via INTRAVENOUS
  Filled 2018-09-03: qty 1

## 2018-09-03 MED ORDER — KETOROLAC TROMETHAMINE 30 MG/ML IJ SOLN
15.0000 mg | Freq: Once | INTRAMUSCULAR | Status: AC
  Administered 2018-09-03: 15 mg via INTRAVENOUS
  Filled 2018-09-03: qty 1

## 2018-09-03 MED ORDER — DEXAMETHASONE SODIUM PHOSPHATE 10 MG/ML IJ SOLN
10.0000 mg | Freq: Once | INTRAMUSCULAR | Status: AC
  Administered 2018-09-03: 10 mg via INTRAVENOUS
  Filled 2018-09-03: qty 1

## 2018-09-03 MED ORDER — METOCLOPRAMIDE HCL 5 MG/ML IJ SOLN
10.0000 mg | Freq: Once | INTRAMUSCULAR | Status: AC
  Administered 2018-09-03: 10 mg via INTRAVENOUS
  Filled 2018-09-03: qty 2

## 2018-09-03 NOTE — ED Triage Notes (Signed)
Patient states she woke with a headache two days ago and woke this morning "not feeling right and drooling." States she went to bed at 2300 last night. Equal grips bilaterally. Slight left sided facial droop noted at triage with smile. Equal movements bilaterally when patient talking.

## 2018-09-03 NOTE — Discharge Instructions (Addendum)
If your headache worsens or does not go away, you develop fever, vomiting, neck stiffness, weakness or numbness in your arms or legs, or any other new/concerning symptoms then return to the ER for evaluation.

## 2018-09-03 NOTE — ED Provider Notes (Signed)
[facility] EMERGENCY DEPARTMENT Provider Note   CSN: [id number] Arrival date & time: 09/03/18  1407     History   Chief Complaint Chief Complaint  Patient presents with  . Facial Droop    HPI Sharon Gay is a 52 y.o. female.  HPI  52 year old female presents with left-sided headache and left-sided weakness.  The headache started yesterday.  She states she always has a daily headache but it was worse yesterday.  It is on her left temple and throbbing.  Also some left-sided and right-sided neck pain.  This morning when she woke up she is been feeling "off" including dizziness, trouble thinking, slurred speech and drooling out of the left side of her mouth, and left-sided weakness/numbness.  This is all very similar to when she is been here twice before.  She tells me at those times that she was diagnosed with small strokes or at least lack of blood flow to her brain. Has had nausea without vomiting. Tried phenergan.  Past Medical History:  Diagnosis Date  . AC (acromioclavicular) joint bone spurs   . Anxiety   . Arthritis   . Back pain   . Chest pain 2003   11/2007: Admission with unstable angina; normal coronary angiography  with [facility]; nl EF  . Chronic pain   . Colitis 2005  . COPD (chronic obstructive pulmonary disease) ([place])    11/2003: Normal PFTs ex minimal increased volumes and decreased the DLCO; Followed in Pulmonary clinic/ [facility]/ [place]   . Depression   . Gastroesophageal reflux disease 2009   Hiatal hernia; esophageal dilatation for Schatzki's ring in 04/2008; chronic constipation; irritable bowel syndrome  . History of pneumonia   . Hyperlipidemia 2009   Lipid profile 07/2012:227, 91, 56, 153  . Lung disease 2010  . Migraines   . Mitral valve prolapse   . Mitral valve prolapse   . MS (multiple sclerosis) ([place])   . MS (multiple sclerosis) ([place]) 2001  . 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 PONV (postoperative nausea and vomiting)   . Postmenopausal 10/31/2015  . Smoker 10/31/2015    Patient Active Problem List   Diagnosis Date Noted  . Arthritis 06/20/2018  . TIA (transient ischemic attack) 04/01/2018  . Stroke [facility]) probable 03/31/2018  . History of multiple sclerosis 03/31/2018  . Smoker 10/31/2015  . Postmenopausal 10/31/2015  . History of colonic polyps 10/20/2015  . Dysphagia 10/20/2015  . Hepatomegaly 10/20/2015  . Lymphadenopathy 09/07/2015  . CAP (community acquired pneumonia) 08/25/2015  . Severe sepsis with septic shock ([place]) 08/25/2015  . SIRS (systemic inflammatory response syndrome) ([place]) 08/25/2015  . Abdominal pain 08/09/2015  . 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 History of diagnostic tests 10/06/2012  . Hyperlipidemia   . Mitral valve prolapse   . Anxiety   . COPD (chronic obstructive pulmonary disease) ([place])   . Chest pain   . Gastroesophageal reflux disease   . Migraine headache 03/30/2009    Past Surgical History:  Procedure Laterality Date  . COLONOSCOPY W/ POLYPECTOMY  2006   Dr. Gala Romney: 3 diminutive polyps in descending colon, unknown path   . ESOPHAGOGASTRODUODENOSCOPY  2009   Dr. Gala Romney: normal esophagus, small hiatal hernia, s/p empiric dilatilon   . ESOPHAGOGASTRODUODENOSCOPY  2003   Schatzki's ring s/p dilation  . FIBEROPTIC BRONCHOSCOPY  2010  . LIVER BIOPSY    . ORIF RADIAL FRACTURE  2012  Left  . VOCAL CORD POLYP REMOVED  2003     OB History    Gravida  0   Para  0   Term  0   Preterm  0   AB  0   Living  0     SAB  0   TAB  0   Ectopic  0   Multiple  0   Live Births               Home Medications    Prior to Admission medications   Medication Sig Start Date End Date Taking? Authorizing Provider  albuterol (PROVENTIL HFA;VENTOLIN HFA) 108 (90 BASE) MCG/ACT inhaler Inhale 1-2 puffs into the lungs every 6 (six) hours as needed for wheezing or  shortness of breath. 08/25/15   Lily Kocher, PA-C  ALPRAZolam Duanne Moron) 1 MG tablet Take 1 mg by mouth 4 (four) times daily as needed for anxiety.    [provider]  aspirin EC 81 MG tablet Take 81 mg by mouth daily.    [provider]  atorvastatin (LIPITOR) 40 MG tablet Take 1 tablet (40 mg total) by mouth daily. 04/01/18 04/01/19  Caren Griffins, MD  benzonatate (TESSALON) 100 MG capsule Take 100 mg by mouth 3 (three) times daily as needed for cough.  12/01/15   [provider]  Cholecalciferol (VITAMIN D3) 50000 units CAPS Take 1 capsule by mouth every Monday.     [provider]  dexlansoprazole (DEXILANT) 60 MG capsule Take 60 mg by mouth daily.    [provider]  fluticasone (FLONASE) 50 MCG/ACT nasal spray Place 2 sprays into both nostrils 3 (three) times daily.    [provider]  lidocaine (XYLOCAINE) 5 % ointment Apply 1 application topically daily as needed. 11/28/15   [provider]  meloxicam (MOBIC) 7.5 MG tablet Take 7.5 mg by mouth 2 (two) times daily as needed for pain.    [provider]  Naphazoline-Glycerin (REDNESS RELIEF) 0.012-0.25 % SOLN Apply 1-2 drops to eye daily as needed (for allergies).    [provider]  oxycodone (ROXICODONE) 30 MG immediate release tablet Take 30-60 mg by mouth every 4 (four) hours as needed for pain.    [provider]  PARoxetine (PAXIL) 40 MG tablet Take 40 mg by mouth daily.     [provider]  promethazine (PHENERGAN) 25 MG tablet Take 25 mg by mouth every 6 (six) hours as needed for nausea or vomiting.    [provider]  Saline (SIMPLY SALINE) 0.9 % AERS Place 1-2 sprays into the nose daily as needed (for nasal congestion).    [provider]    Family History Family History  Problem Relation Age of Onset  . Emphysema Maternal Grandfather        was a smoker  . Arthritis Sister   . Mental retardation Sister   .  Heart disease Sister        Also brother; cardiomyopathy  . Thyroid disease Sister   . Fibromyalgia Sister   . Arthritis Sister   . Migraines Sister   . COPD Sister   . Heart attack Father 22  . Arthritis Sister   . Migraines Sister   . Other Sister        brain tumor  . Arthritis Brother   . Arthritis Brother   . Colon cancer Neg Hx     Social History Social History   Tobacco Use  . Smoking  status: Former Smoker    Packs/day: 0.50    Years: 30.00    Pack years: 15.00    Types: Cigarettes  . Smokeless tobacco: Never Used  . Tobacco comment: smokes 4-5 cig daily  Substance Use Topics  . Alcohol use: No  . Drug use: No     Allergies   Penicillins; Morphine; Rifampin; Topamax [topiramate]; Cymbalta [duloxetine hcl]; and Tequin [gatifloxacin]   Review of Systems Review of Systems  Constitutional: Negative for fever.  Eyes: Positive for visual disturbance (bilateral).  Gastrointestinal: Positive for nausea. Negative for vomiting.  Musculoskeletal: Positive for neck pain.  Neurological: Positive for dizziness, speech difficulty, weakness, numbness and headaches.  All other systems reviewed and are negative.    Physical Exam Updated Vital Signs BP (!) 135/96 (BP Location: Right Arm)   Pulse 81   Temp 98.2 F (36.8 C) (Oral)   Resp 18   Ht _0  (1.803 m)   Wt 64.4 kg   SpO2 98%   BMI 19.80 kg/m   Physical Exam Vitals signs and nursing note reviewed.  Constitutional:      General: She is not in acute distress.    Appearance: She is well-developed. She is not ill-appearing or diaphoretic.  HENT:     Head: Normocephalic and atraumatic.     Right Ear: External ear normal.     Left Ear: External ear normal.     Nose: Nose normal.  Eyes:     General:        Right eye: No discharge.        Left eye: No discharge.     Extraocular Movements: Extraocular movements intact.     Pupils: Pupils are equal, round, and reactive to light.  Neck:      Musculoskeletal: Normal range of motion and neck supple. No neck rigidity.  Cardiovascular:     Rate and Rhythm: Normal rate and regular rhythm.     Heart sounds: Normal heart sounds.  Pulmonary:     Effort: Pulmonary effort is normal.     Breath sounds: Normal breath sounds.  Abdominal:     Palpations: Abdomen is soft.     Tenderness: There is no abdominal tenderness.  Skin:    General: Skin is warm and dry.  Neurological:     Mental Status: She is alert.     Comments: CN 3-12 grossly intact. 5/5 strength in all 4 extremities. There is some slight weakness in the left arm but is still 5/5. Grossly normal sensation though she reports mild decrease in left arm. Normal finger to nose.   Psychiatric:        Mood and Affect: Mood is not anxious.      ED Treatments / Results  Labs (all labs ordered are listed, but only abnormal results are displayed) Labs Reviewed  CBC - Abnormal; Notable for the following components:      Result Value   Hemoglobin 15.1 (*)    HCT 47.3 (*)    All other components within normal limits  PROTIME-INR  APTT  DIFFERENTIAL  COMPREHENSIVE METABOLIC PANEL  TROPONIN I  CBG MONITORING, ED    EKG EKG Interpretation  Date/Time:  Thursday September 03 2018 19:08:30 EST Ventricular Rate:  64 PR Interval:    QRS Duration: 83 QT Interval:  432 QTC Calculation: 446 R Axis:   -14 Text Interpretation:  Sinus rhythm no acute ST/T changes no significant change since Oct 2019 Confirmed by Sherwood Gambler [phone]) on 09/03/2018 7:12:05  PM   Radiology Ct Head Wo Contrast  Result Date: 09/03/2018 CLINICAL DATA:  Headache onset last night. Slurred speech and blurred vision this morning. Slight left-sided facial droop. EXAM: CT HEAD WITHOUT CONTRAST TECHNIQUE: Contiguous axial images were obtained from the base of the skull through the vertex without intravenous contrast. COMPARISON:  MRI brain 06/20/2018.  CT 06/19/2018 FINDINGS: Brain: There is no evidence of  acute intracranial hemorrhage, mass lesion, brain edema or extra-axial fluid collection. The ventricles and subarachnoid spaces are appropriately sized for age. There is no CT evidence of acute cortical infarction. Vascular:  No hyperdense vessel identified. Skull: Negative for fracture or focal lesion. Sinuses/Orbits: The visualized paranasal sinuses and mastoid air cells are clear. No orbital abnormalities are seen. Other: None. IMPRESSION: Normal examination without acute findings or changes from previous studies. Electronically Signed   By: Richardean Sale M.D.   On: 09/03/2018 15:39   Mr Brain Wo Contrast  Result Date: 09/03/2018 CLINICAL DATA:  Initial evaluation for acute headache, slight facial droop. EXAM: MRI HEAD WITHOUT CONTRAST TECHNIQUE: Multiplanar, multiecho pulse sequences of the brain and surrounding structures were obtained without intravenous contrast. COMPARISON:  Prior CT from earlier the same day as well as previous MRI from 06/20/2018. FINDINGS: Brain: Cerebral volume within normal limits for age. No significant cerebral white matter changes identified. No abnormal foci of restricted diffusion to suggest acute or subacute ischemia. Gray-white matter differentiation maintained. No encephalomalacia to suggest chronic infarction. No foci of susceptibility artifact to suggest acute or chronic intracranial hemorrhage. No mass lesion, midline shift or mass effect. No hydrocephalus. No extra-axial fluid collection. Pituitary gland within normal limits. Vascular: Major intracranial vascular flow voids maintained. Skull and upper cervical spine: Craniocervical junction normal. Bone marrow signal intensity within normal limits. No scalp soft tissue abnormality. Sinuses/Orbits: Globes and orbital soft tissues within normal limits. Small maxillary sinus retention cyst noted bilaterally. Paranasal sinuses are otherwise clear. Trace right mastoid effusion, of doubtful significance. Inner ear  structures grossly normal. Other: None. IMPRESSION: Normal brain MRI.  No acute intracranial abnormality identified. Electronically Signed   By: Jeannine Boga M.D.   On: 09/03/2018 18:54    Procedures Procedures (including critical care time)  Medications Ordered in ED Medications  sodium chloride 0.9 % bolus 1,000 mL (1,000 mLs Intravenous New Bag/Given 09/03/18 1835)  metoCLOPramide (REGLAN) injection 10 mg (10 mg Intravenous Given 09/03/18 1830)  diphenhydrAMINE (BENADRYL) injection 25 mg (25 mg Intravenous Given 09/03/18 1830)  ketorolac (TORADOL) 30 MG/ML injection 15 mg (15 mg Intravenous Given 09/03/18 1830)  dexamethasone (DECADRON) injection 10 mg (10 mg Intravenous Given 09/03/18 1917)     Initial Impression / Assessment and Plan / ED Course  I have reviewed the triage vital signs and the nursing notes.  Pertinent labs & imaging results that were available during my care of the patient were reviewed by me and considered in my medical decision making (see chart for details).     Patient's presentation sounds like a complicated migraine.  I believe that is what her other 2 presentations were as well.  Given her acute symptoms today, MRI obtained and shows no acute stroke or MS.  Given this, I think migraine is the most likely cause.  I do not think a repeat admission would be beneficial.  She has had 2 CT angiography is before that showed no acute vessel occlusion or symptomatic pathology.  Thus I do not think that would be helpful even though there is a little bit  of recurrent neck pain.  There is no stiffness or fever to suggest infection.  At this point, I think she stable for discharge home.  Offered more headache medicine as she feels like she still has a headache but when I told her that her work-up is otherwise done she wants to leave.  I think this is reasonable and I have counseled her that following up with neurology would be a good idea.  Otherwise, follow-up with PCP  and we discussed return precautions.  Final Clinical Impressions(s) / ED Diagnoses   Final diagnoses:  Hemiplegic migraine without status migrainosus, not intractable    ED Discharge Orders    None       Sherwood Gambler, MD 09/03/18 1920

## 2019-11-23 ENCOUNTER — Other Ambulatory Visit (HOSPITAL_COMMUNITY): Payer: Self-pay | Admitting: Family Medicine

## 2019-11-23 DIAGNOSIS — Z1231 Encounter for screening mammogram for malignant neoplasm of breast: Secondary | ICD-10-CM

## 2019-12-08 ENCOUNTER — Ambulatory Visit (HOSPITAL_COMMUNITY): Payer: Medicaid Other

## 2020-06-22 ENCOUNTER — Other Ambulatory Visit: Payer: Self-pay

## 2020-06-22 ENCOUNTER — Ambulatory Visit (INDEPENDENT_AMBULATORY_CARE_PROVIDER_SITE_OTHER): Payer: Medicaid Other | Admitting: Adult Health

## 2020-06-22 ENCOUNTER — Other Ambulatory Visit (HOSPITAL_COMMUNITY)
Admission: RE | Admit: 2020-06-22 | Discharge: 2020-06-22 | Disposition: A | Discharge status: Home / Self Care | Payer: Medicaid Other | Source: Ambulatory Visit | Attending: Adult Health | Admitting: Adult Health

## 2020-06-22 ENCOUNTER — Encounter: Payer: Self-pay | Admitting: Adult Health

## 2020-06-22 VITALS — BP 118/72 | HR 70 | Ht 71.0 in | Wt 146.0 lb

## 2020-06-22 DIAGNOSIS — R102 Pelvic and perineal pain: Secondary | ICD-10-CM | POA: Diagnosis not present

## 2020-06-22 DIAGNOSIS — G35 Multiple sclerosis: Secondary | ICD-10-CM

## 2020-06-22 DIAGNOSIS — Z1211 Encounter for screening for malignant neoplasm of colon: Secondary | ICD-10-CM | POA: Insufficient documentation

## 2020-06-22 DIAGNOSIS — N941 Unspecified dyspareunia: Secondary | ICD-10-CM | POA: Diagnosis not present

## 2020-06-22 DIAGNOSIS — N898 Other specified noninflammatory disorders of vagina: Secondary | ICD-10-CM | POA: Insufficient documentation

## 2020-06-22 DIAGNOSIS — Z8673 Personal history of transient ischemic attack (TIA), and cerebral infarction without residual deficits: Secondary | ICD-10-CM | POA: Insufficient documentation

## 2020-06-22 DIAGNOSIS — Z01419 Encounter for gynecological examination (general) (routine) without abnormal findings: Secondary | ICD-10-CM

## 2020-06-22 LAB — POCT URINALYSIS DIPSTICK
Blood, UA: NEGATIVE
Glucose, UA: NEGATIVE
Leukocytes, UA: NEGATIVE
Nitrite, UA: NEGATIVE
Protein, UA: NEGATIVE

## 2020-06-22 LAB — HEMOCCULT GUIAC POC 1CARD (OFFICE): Fecal Occult Blood, POC: NEGATIVE

## 2020-06-22 NOTE — Progress Notes (Signed)
Patient ID: [name], female   DOB: [birth year], 54 y.o.   MRN: [id number] History of Present Illness:  Sharon Gay is a 54 year old white female, married, PM in for well woman gyn exam and pap. Husband is not happy over her saying sex hurts, and  she days he curses her, then she does not have the desire. She cares for her handicapped sister.   PCP is Dr Sherwood Gambler.   Current Medications, Allergies, Past Medical History, Past Surgical History, Family History and Social History were reviewed in [employer] electronic medical record.     Review of Systems:  Patient denies any  hearing loss, fatigue, blurred vision, shortness of breath, chest pain, abdominal pain, problems with bowel movements, urination(has to pee often), or intercourse(has pain with sex). No joint pain or mood swings. Has headaches at times and blurred vision due to MS she says   Physical Exam:BP 118/72 (BP Location: Left Arm, Patient Position: Sitting, Cuff Size: Normal)   Pulse 70   Ht 5\' 11"  (1.803 m)   Wt 146 lb (66.2 kg)   BMI 20.36 kg/m urine dipstick is negative  General:  Well developed, well nourished, no acute distress Skin:  Warm and dry Neck:  Midline trachea, normal thyroid, good ROM, no lymphadenopathy Lungs; Clear to auscultation bilaterally Breast:  No dominant palpable mass, retraction, or nipple discharge Cardiovascular: Regular rate and rhythm Abdomen:  Soft, non tender, no hepatosplenomegaly Pelvic:  External genitalia is normal in appearance, no lesions.  The vagina is pale, and loss of moisture and rugae. Urethra has no lesions or masses. The cervix is smooth and nulliparous, pap with GC/CHL and high risk HPV genotyping.  Uterus is felt to be normal size, shape, and contour.  No adnexal masses or tenderness noted.Bladder is  tender, no masses felt. Rectal: Good sphincter tone, no polyps, or hemorrhoids felt.  Hemoccult negative. Extremities/musculoskeletal:  No swelling or varicosities noted, no  clubbing or cyanosis Psych:  No mood changes, alert and cooperative,seems stressed AA is 0 Fall risk is low PHQ 9 score is 23, no SI is on meds   Upstream - 06/22/20 0913      Pregnancy Intention Screening   Does the patient want to become pregnant in the next year? N/A    Does the patient's partner want to become pregnant in the next year? N/A    Would the patient like to discuss contraceptive options today? N/A      Contraception Wrap Up   Current Method --   PM   End Method --   PM   Contraception Counseling Provided No         Examination chaperoned by 06/24/20 LPN  Impression and plan:  1. Encounter for gynecological examination with Papanicolaou smear of cervix Pap sent Physical in 1 year Pap in 3 years if normal  Labs with PCP  2. Encounter for screening fecal occult blood testing  3. Dyspareunia in female Try replens and astroglide  4. Vaginal dryness Try replens  5. History of multiple sclerosis (HCC)   6. History of stroke   7. Pelvic pain GYN Faith Rogue at [facility] 06/28/20  At 11:30

## 2020-06-23 LAB — CYTOLOGY - PAP
Chlamydia: NEGATIVE
Comment: NEGATIVE
Comment: NEGATIVE
Comment: NORMAL
Diagnosis: NEGATIVE
High risk HPV: NEGATIVE
Neisseria Gonorrhea: NEGATIVE

## 2020-06-28 ENCOUNTER — Ambulatory Visit (HOSPITAL_COMMUNITY): Admission: RE | Admit: 2020-06-28 | Payer: Self-pay | Source: Ambulatory Visit

## 2021-10-02 ENCOUNTER — Other Ambulatory Visit (HOSPITAL_COMMUNITY): Payer: Self-pay | Admitting: Internal Medicine

## 2021-10-02 DIAGNOSIS — R079 Chest pain, unspecified: Secondary | ICD-10-CM

## 2022-10-07 ENCOUNTER — Ambulatory Visit: Payer: Medicaid Other | Admitting: Diagnostic Neuroimaging

## 2022-10-07 ENCOUNTER — Encounter: Payer: Self-pay | Admitting: Diagnostic Neuroimaging

## 2022-10-07 VITALS — BP 126/75 | HR 74 | Ht 68.0 in | Wt 156.2 lb

## 2022-10-07 DIAGNOSIS — R519 Headache, unspecified: Secondary | ICD-10-CM | POA: Diagnosis not present

## 2022-10-07 DIAGNOSIS — R531 Weakness: Secondary | ICD-10-CM | POA: Diagnosis not present

## 2022-10-07 MED ORDER — RIZATRIPTAN BENZOATE 10 MG PO TABS: ORAL_TABLET | ORAL | 6 refills | Status: AC

## 2022-10-07 MED ORDER — AIMOVIG 70 MG/ML ~~LOC~~ SOAJ: 70.0000 mg | SUBCUTANEOUS | 4 refills | Status: DC

## 2022-10-07 NOTE — Progress Notes (Signed)
[facility]  PATIENT: Sharon Gay DOB: May 11, 1966  REFERRING CLINICIAN: Redmond School, MD HISTORY FROM: patient REASON FOR VISIT: new consult   HISTORICAL  CHIEF COMPLAINT:  Chief Complaint  Patient presents with   New Patient (Initial Visit)    Patient in room #6 with her sister tina. Patient here today to discuss numbness and tingling in her head and neck. Patient states she been having headaches.    HISTORY OF PRESENT ILLNESS:   57 year old female here for evaluation of left-sided numbness and headaches.  Symptoms started in early 2000's with bilateral temporal headaches, throbbing sensation, nausea, sensitive to light, sound, smell.  Headaches can last 3 days at a time.  Averaging about 6 days headache per month.  In 2019 patient had strokelike symptoms and went to the hospital for evaluation.  MRI of the brain was negative.  She went to the hospital 2 more times for similar symptoms and each time MRI of the brain was negative.  CTA of the head and neck were normal.  Patient was told she had diagnosis of fibromyalgia and multiple sclerosis in the past.  However none of her MRI brain scans have showed any chronic demyelinating plaques.  Strong family history of migraine.  Patient has tried topiramate in the past.  She tried Demerol shots in the past.  Nowadays she takes aspirin as needed for headaches.    REVIEW OF SYSTEMS: Full 14 system review of systems performed and negative with exception of: as per HPI.  ALLERGIES: Allergies  Allergen Reactions   Penicillins Anaphylaxis, Nausea And Vomiting and Swelling    Has patient had a PCN reaction causing immediate rash, facial/tongue/throat swelling, SOB or lightheadedness with hypotension: No Has patient had a PCN reaction causing severe rash involving mucus membranes or skin necrosis: No Has patient had a PCN reaction that required hospitalization No Has patient had a PCN reaction occurring  within the last 10 years: No If all of the above answers are "NO", then may proceed with Cephalosporin use.    Morphine Other (See Comments)    Hallucinations   Rifampin Nausea Only and Other (See Comments)    Blisters around lips and tongue Discolored orange urine and yellowed skin expected with Rifampin, not adverse reaction   Topamax [Topiramate] Other (See Comments)    Headache and confusion.   Cymbalta [Duloxetine Hcl] Other (See Comments)    Dizziness    Tequin [Gatifloxacin] Nausea And Vomiting    HOME MEDICATIONS: Outpatient Medications Prior to Visit  Medication Sig Dispense Refill   albuterol (PROVENTIL HFA;VENTOLIN HFA) 108 (90 BASE) MCG/ACT inhaler Inhale 1-2 puffs into the lungs every 6 (six) hours as needed for wheezing or shortness of breath. 1 Inhaler 0   ALPRAZolam (XANAX) 1 MG tablet Take 1 mg by mouth 4 (four) times daily as needed for anxiety.     Ascorbic Acid (VITAMIN C) 1000 MG tablet Take 1,000 mg by mouth daily.     aspirin EC 81 MG tablet Take 81 mg by mouth daily.     Cholecalciferol (VITAMIN D3) 50000 units CAPS Take 1 capsule by mouth every Monday.      fluticasone (FLONASE) 50 MCG/ACT nasal spray Place 2 sprays into both nostrils 3 (three) times daily.     lidocaine (XYLOCAINE) 5 % ointment Apply 1 application topically daily as needed.     meloxicam (MOBIC) 7.5 MG tablet Take 7.5 mg by mouth 2 (two) times daily as needed for pain.  nicotine (NICODERM CQ - DOSED IN MG/24 HOURS) 21 mg/24hr patch Place onto the skin.     omeprazole (PRILOSEC) 40 MG capsule Take 40 mg by mouth daily.     oxycodone (ROXICODONE) 30 MG immediate release tablet Take 30-60 mg by mouth every 4 (four) hours as needed for pain.     PARoxetine (PAXIL) 40 MG tablet Take 40 mg by mouth daily.      promethazine (PHENERGAN) 25 MG tablet Take 25 mg by mouth every 6 (six) hours as needed for nausea or vomiting.     No facility-administered medications prior to visit.    PAST MEDICAL  HISTORY: Past Medical History:  Diagnosis Date   AC (acromioclavicular) joint bone spurs    Anxiety    Arthritis    Back pain    Chest pain 2003   11/2007: Admission with unstable angina; normal coronary angiography  with [facility]; nl EF   Chronic pain    Colitis 2005   COPD (chronic obstructive pulmonary disease) ([place])    11/2003: Normal PFTs ex minimal increased volumes and decreased the DLCO; Followed in Pulmonary clinic/ [facility]/ [place]    Depression    Gastroesophageal reflux disease 2009   Hiatal hernia; esophageal dilatation for Schatzki's ring in 04/2008; chronic constipation; irritable bowel syndrome   History of pneumonia    Hyperlipidemia 2009   Lipid profile 07/2012:227, 91, 56, 153   Lung disease 2010   Migraines    Mitral valve prolapse    Mitral valve prolapse    MS (multiple sclerosis) ([place])    MS (multiple sclerosis) ([place]) 2001   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 Gala Romney: 3 diminutive polyps in descending colon, unknown path    ESOPHAGOGASTRODUODENOSCOPY  2009   Dr. Gala Romney: normal esophagus, small hiatal hernia, s/p empiric dilatilon    ESOPHAGOGASTRODUODENOSCOPY  2003   Schatzki's ring s/p dilation   FIBEROPTIC BRONCHOSCOPY  2010   LIVER BIOPSY     ORIF RADIAL FRACTURE  2012   Left   VOCAL CORD POLYP REMOVED  2003    FAMILY HISTORY: Family History  Problem Relation Age of Onset   Emphysema Maternal Grandfather        was a smoker   Arthritis Sister    Mental retardation Sister    Heart disease Sister        Also brother; cardiomyopathy   Thyroid disease Sister    Fibromyalgia Sister    Arthritis Sister    Migraines Sister    COPD Sister    Heart attack Father  2   Arthritis Sister    Migraines Sister    Other Sister        brain tumor   Arthritis Brother    Arthritis Brother    Colon cancer Neg Hx     SOCIAL HISTORY: Social History   Socioeconomic History   Marital status: Married    Spouse name: Not on file   Number of children: 2   Years of education: Not on file   Highest education level: Not on file  Occupational History   Occupation: Automotive  Tobacco Use   Smoking status: Every Day    Packs/day: 0.25  Years: 30.00    Total pack years: 7.50    Types: Cigarettes   Smokeless tobacco: Never   Tobacco comments:    smokes 4-5 cig daily  Vaping Use   Vaping Use: Never used  Substance and Sexual Activity   Alcohol use: No   Drug use: No   Sexual activity: Yes    Birth control/protection: Post-menopausal  Other Topics Concern   Not on file  Social History Narrative   2 stepchildren   Social Determinants of Health   Financial Resource Strain: Medium Risk (06/22/2020)   Overall Financial Resource Strain (CARDIA)    Difficulty of Paying Living Expenses: Somewhat hard  Food Insecurity: No Food Insecurity (06/22/2020)   Hunger Vital Sign    Worried About Running Out of Food in the Last Year: Never true    Ran Out of Food in the Last Year: Never true  Transportation Needs: No Transportation Needs (06/22/2020)   PRAPARE - Hydrologist (Medical): No    Lack of Transportation (Non-Medical): No  Physical Activity: Sufficiently Active (06/22/2020)   Exercise Vital Sign    Days of Exercise per Week: 5 days    Minutes of Exercise per Session: 30 min  Stress: Stress Concern Present (06/22/2020)   [place]    Feeling of Stress : Very much  Social Connections: Socially Integrated (06/22/2020)   Social Connection and Isolation Panel [NHANES]    Frequency of Communication with Friends and Family: More than three times a week     Frequency of Social Gatherings with Friends and Family: Once a week    Attends Religious Services: More than 4 times per year    Active Member of [employer] or Organizations: Yes    Attends Archivist Meetings: 1 to 4 times per year    Marital Status: Married  Human resources officer Violence: Not At Risk (06/22/2020)   Humiliation, Afraid, Rape, and Kick questionnaire    Fear of Current or Ex-Partner: No    Emotionally Abused: No    Physically Abused: No    Sexually Abused: No     PHYSICAL EXAM  GENERAL EXAM/CONSTITUTIONAL: Vitals:  Vitals:   10/07/22 0925  BP: 126/75  Pulse: 74  Weight: 156 lb 3.2 oz (70.9 kg)  Height: 5' 8"  (1.727 m)   Body mass index is 23.75 kg/m. Wt Readings from Last 3 Encounters:  10/07/22 156 lb 3.2 oz (70.9 kg)  06/22/20 146 lb (66.2 kg)  09/03/18 142 lb (64.4 kg)   Patient is in no distress; well developed, nourished and groomed; neck is supple  CARDIOVASCULAR: Examination of carotid arteries is normal; no carotid bruits Regular rate and rhythm, no murmurs Examination of peripheral vascular system by observation and palpation is normal  EYES: Ophthalmoscopic exam of optic discs and posterior segments is normal; no papilledema or hemorrhages No results found.  MUSCULOSKELETAL: Gait, strength, tone, movements noted in Neurologic exam below  NEUROLOGIC: MENTAL STATUS:      No data to display         awake, alert, oriented to person, place and time recent and remote memory intact normal attention and concentration language fluent, comprehension intact, naming intact fund of knowledge appropriate  CRANIAL NERVE:  2nd - no papilledema on fundoscopic exam 2nd, 3rd, 4th, 6th - pupils equal and reactive to light, visual fields full to confrontation, extraocular muscles intact, no nystagmus 5th - facial sensation symmetric 7th - facial strength  symmetric 8th - hearing intact 9th - palate elevates symmetrically, uvula midline 11th -  shoulder shrug symmetric 12th - tongue protrusion midline  MOTOR:  normal bulk and tone, full strength in the BUE, BLE  SENSORY:  normal and symmetric to light touch, temperature, vibration  COORDINATION:  finger-nose-finger, fine finger movements normal  REFLEXES:  deep tendon reflexes TRACE and symmetric  GAIT/STATION:  narrow based gait     DIAGNOSTIC DATA (LABS, IMAGING, TESTING) - I reviewed patient records, labs, notes, testing and imaging myself where available.  Lab Results  Component Value Date   WBC 7.6 09/03/2018   HGB 15.1 (H) 09/03/2018   HCT 47.3 (H) 09/03/2018   MCV 96.7 09/03/2018   PLT 330 09/03/2018      Component Value Date/Time   NA 139 09/03/2018 1456   K 4.5 09/03/2018 1456   CL 105 09/03/2018 1456   CO2 26 09/03/2018 1456   GLUCOSE 98 09/03/2018 1456   BUN 14 09/03/2018 1456   CREATININE 0.80 09/03/2018 1456   CREATININE 0.72 09/15/2015 0939   CALCIUM 9.8 09/03/2018 1456   PROT 7.6 09/03/2018 1456   ALBUMIN 4.4 09/03/2018 1456   AST 24 09/03/2018 1456   ALT 26 09/03/2018 1456   ALKPHOS 64 09/03/2018 1456   BILITOT 0.4 09/03/2018 1456   GFRNONAA >60 09/03/2018 1456   GFRNONAA >89 09/15/2015 0939   GFRAA >60 09/03/2018 1456   GFRAA >89 09/15/2015 0939   Lab Results  Component Value Date   CHOL 233 (H) 12/01/2017   HDL 76 12/01/2017   LDLCALC 147 (H) 12/01/2017   TRIG 52 12/01/2017   CHOLHDL 3.1 12/01/2017   No results found for: "HGBA1C" No results found for: "VITAMINB12" Lab Results  Component Value Date   TSH 1.265 12/01/2017    09/03/18 [I reviewed images myself and agree with interpretation. -VRP]  Normal brain MRI.  No acute intracranial abnormality identified.   06/19/18 CTA head / neck 1. Normal CT/CTA of the head and neck. 2. ASPECTS is 10.  04/01/18 TTE - Left ventricle: The cavity size was normal. Systolic function was    normal. The estimated ejection fraction was in the range of 55%    to 60%. Wall motion  was normal; there were no regional wall    motion abnormalities. Left ventricular diastolic function    parameters were normal.    ASSESSMENT AND PLAN  57 y.o. year old female here with:   Meds tried: topiramate, aspirin, tylenol, paroxetine  Cannot take: propranolol (COPD), amitriptyline (already on paroxetine)  Dx:  1. Worsening headaches   2. Left-sided weakness     PLAN:  WORSENING HEADACHES, LEFT SIDED WEAKNESS - check MRI brain (with and without)   MIGRAINE WITH AURA  MIGRAINE PREVENTION  LIFESTYLE CHANGES -Stop or avoid smoking -Decrease or avoid caffeine / alcohol -Eat and sleep on a regular schedule -Exercise several times per week - start erenumab (Aimovig) 57m monthly (may increase to 1417mmonthly)  MIGRAINE RESCUE  - ibuprofen, tylenol as needed - rizatriptan (Maxalt) 1072ms needed for breakthrough headache; may repeat x 1 after 2 hours; max 2 tabs per day or 8 per month  Consider - ubrogepant (UbRoselyn Meier91m57m needed for breakthrough headache; may repeat x 1 after 2 hours; max 2 tabs per day or 8 per month - rimegepant (Nurtec) 75mg42mneeded for breakthrough headache; max 8 per month  Orders Placed This Encounter  Procedures   MR BRAIN W WO CONTRAST  Meds ordered this encounter  Medications   rizatriptan (MAXALT) 10 MG tablet    Sig: As needed for headache; may repeat in 2 hours if needed; max 2 per day or 8 per month    Dispense:  8 tablet    Refill:  6   Erenumab-aooe (AIMOVIG) 70 MG/ML SOAJ    Sig: Inject 70 mg into the skin every 30 (thirty) days.    Dispense:  3 mL    Refill:  4   Return in about 6 months (around 04/07/2023) for with NP.    Penni Bombard, MD 7/65/4650, 35:46 AM Certified in Neurology, Neurophysiology and [facility] [address] [place], [place] [address] [phone]

## 2022-10-07 NOTE — Patient Instructions (Signed)
  WORSENING HEADACHES, LEFT SIDED WEAKNESS - check MRI brain (with and without)   MIGRAINE WITH AURA  MIGRAINE PREVENTION  LIFESTYLE CHANGES -Stop or avoid smoking -Decrease or avoid caffeine / alcohol -Eat and sleep on a regular schedule -Exercise several times per week - start erenumab (Aimovig) 70mg  monthly (may increase to 140mg  monthly)  MIGRAINE RESCUE  - ibuprofen, tylenol as needed - rizatriptan (Maxalt) 10mg  as needed for breakthrough headache; may repeat x 1 after 2 hours; max 2 tabs per day or 8 per month

## 2022-10-15 ENCOUNTER — Telehealth: Payer: Self-pay | Admitting: Diagnostic Neuroimaging

## 2022-10-15 NOTE — Telephone Encounter (Signed)
[employer] medicaid Josem Kaufmann: [id number] exp. 10/15/22-11/29/22 sent to GI

## 2022-10-18 ENCOUNTER — Telehealth: Payer: Self-pay | Admitting: Pharmacy Technician

## 2022-10-18 NOTE — Telephone Encounter (Signed)
Patient Advocate Encounter   Received notification that prior authorization for Aimovig 70MG/ML auto-injectors is required.   PA submitted on 10/18/2022 Key [id number] Status is pending       Lyndel Safe, [place] Patient Advocate Specialist [place] Patient Advocate Team Direct Number: [phone]  Fax: [phone]

## 2022-10-22 ENCOUNTER — Other Ambulatory Visit: Payer: Medicaid Other

## 2022-10-22 NOTE — Telephone Encounter (Signed)
Can this be appealed? Pt has Migraine headache Code: [id number]. If she has 6 headache days per month, that can last 3 days at a time, she has about 18 a month.

## 2022-10-22 NOTE — Telephone Encounter (Signed)
Patient Advocate Encounter  Received notification that the request for prior authorization for Aimovig 70MG/ML auto-injectors has been denied due to Per your health plan's criteria, this drug is covered if you meet the following: You have a type of headache condition (migraine with or without aura based on the International Classification of Headache Disorders criteria). The information provided does not show that you meet the criteria listed above.Lyndel Safe, [place] Patient Advocate Specialist [place] Patient Advocate Team Direct Number: [phone]  Fax: [phone]

## 2022-10-30 NOTE — Telephone Encounter (Signed)
Contacted [employer] to attempt appeal and spoke to [facility]. She stated they are in the middle of a system update and couldn't provide any information. Will call back tomorrow.

## 2022-10-31 NOTE — Telephone Encounter (Signed)
Attempted to contact [facility] again, was on the phone for 24 mins and got transferred to [facility]. The call ended and was unable to reach someone, will call back Monday.

## 2022-11-04 NOTE — Telephone Encounter (Signed)
Contacted [employer] to submit appeal, spoke to [place]. Urgent appeal was initiated via phone and decision will be faxed to me within 24 hrs. Case IN:[id number]

## 2022-11-05 NOTE — Telephone Encounter (Signed)
Approval received via fax -02/02/23. Pharmacy notified.

## 2022-11-18 ENCOUNTER — Other Ambulatory Visit: Payer: Medicaid Other

## 2022-12-11 ENCOUNTER — Other Ambulatory Visit: Payer: Medicaid Other

## 2022-12-18 ENCOUNTER — Other Ambulatory Visit (HOSPITAL_COMMUNITY): Payer: Self-pay | Admitting: Internal Medicine

## 2022-12-18 DIAGNOSIS — Z1231 Encounter for screening mammogram for malignant neoplasm of breast: Secondary | ICD-10-CM

## 2022-12-19 ENCOUNTER — Other Ambulatory Visit (HOSPITAL_COMMUNITY): Payer: Self-pay | Admitting: Internal Medicine

## 2022-12-19 DIAGNOSIS — E2839 Other primary ovarian failure: Secondary | ICD-10-CM

## 2023-01-14 ENCOUNTER — Other Ambulatory Visit (HOSPITAL_COMMUNITY): Payer: Self-pay | Admitting: Internal Medicine

## 2023-01-14 DIAGNOSIS — R160 Hepatomegaly, not elsewhere classified: Secondary | ICD-10-CM

## 2023-01-17 ENCOUNTER — Ambulatory Visit (INDEPENDENT_AMBULATORY_CARE_PROVIDER_SITE_OTHER): Payer: Medicaid Other | Admitting: Internal Medicine

## 2023-01-17 ENCOUNTER — Encounter: Payer: Self-pay | Admitting: Internal Medicine

## 2023-01-17 VITALS — BP 117/72 | HR 63 | Temp 98.5°F | Ht 68.0 in | Wt 150.2 lb

## 2023-01-17 DIAGNOSIS — J449 Chronic obstructive pulmonary disease, unspecified: Secondary | ICD-10-CM

## 2023-01-17 DIAGNOSIS — F17210 Nicotine dependence, cigarettes, uncomplicated: Secondary | ICD-10-CM | POA: Diagnosis not present

## 2023-01-17 DIAGNOSIS — Z2239 Carrier of other specified bacterial diseases: Secondary | ICD-10-CM

## 2023-01-17 MED ORDER — AIRSUPRA 90-80 MCG/ACT IN AERO: 2.0000 | INHALATION_SPRAY | RESPIRATORY_TRACT | 0 refills | Status: AC | PRN

## 2023-01-17 NOTE — Patient Instructions (Addendum)
Mucinex 600 mg  1-2 every 12 hours (2400 mg total)  as needed for cough / congestion   For shortness of breath, wheezing /coughing that don't improve at rest  > albuterol 1-2 pffs  every 4 hours as needed - should your use go up I need to see you back here to consider stronger medications   Ok to try albuterol 15 min before an activity (on alternating days)  that you know would usually make you short of breath and see if it makes any difference and if makes none then don't take albuterol after activity unless you can't catch your breath as this means it's the resting that helps, not the albuterol.  Work on inhaler technique:  relax and gently blow all the way out then take a nice smooth full deep breath back in, triggering the inhaler at same time you start breathing in.  Hold breath in for at least  5 seconds if you can.  Rinse and gargle with water when done.  If mouth or throat bother you at all,  try brushing teeth/gums/tongue with arm and hammer toothpaste/ make a slurry and gargle and spit out.    The key is to stop smoking completely before smoking completely stops you!  Please remember to go to the  x-ray department  @  [facility] for your tests - we will call you with the results when they are available     PFTs next available at [facility] and I will call you with results and go from there.

## 2023-01-17 NOTE — Progress Notes (Signed)
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 MAI  10/02/2012 1st pulmonary eval cc 3 year hs of progressive sob worse x housework, one block can do HT ok, varies some. Back pain when standing is "throbbing" - no better with saba, no worse with deep breath, present daily and sometimes so severe she gets nauseated with it.  Min cough non productive and does not make the pain worse  Rec The key is to stop smoking completely before smoking completely stops you!  GERD diet reviewed, bed blocks rec    01/17/2023  f/u ov/ office/Petrita Blunck re: asthma/ MAC maint on advair  / still smoking  Chief Complaint  Patient presents with   pulmonary consult    SOB with exertion, dry cough at times prod with clear sputum and occ wheezing.   Dyspnea:  walk at [employer] no problem, sometimes sob sweeping / fast walker  Cough: clear mucus/ min vol   Sleeping: flat bed/ 2 pillows  SABA use: none prior to OV  - never rechallenges or prechallenges  02: none     T-8/9 dermatome pain x several years/postional    No obvious day to day or daytime variability or assoc purulent sputum or mucus plugs or hemoptysis or cp or chest tightness,  or overt sinus or hb symptoms.    . Also denies any obvious fluctuation of symptoms with weather or environmental changes or other aggravating or alleviating factors except as outlined above   No unusual exposure hx or h/o childhood pna/ asthma or knowledge of premature birth.  Current Allergies, Complete Past Medical History, Past Surgical History, Family History, and Social History were reviewed in [employer] record.  ROS  The following are not active complaints unless bolded Hoarseness, sore throat, dysphagia, dental  problems, itching, sneezing,  nasal congestion or discharge of excess mucus or purulent secretions, ear ache,   fever, chills, sweats, unintended wt loss or wt gain, classically pleuritic or exertional cp,  orthopnea pnd or arm/hand swelling  or leg swelling, presyncope, palpitations, abdominal pain, anorexia, nausea, vomiting, diarrhea  or change in bowel habits or change in bladder habits, change in stools or change in urine, dysuria, hematuria,  rash, arthralgias/ back pain rad T8/9 bilaterally , visual complaints, headache, numbness, weakness or ataxia or problems with walking or coordination,  change in mood or  memory.        Current Meds  Medication Sig   albuterol (PROVENTIL HFA;VENTOLIN HFA) 108 (90 BASE) MCG/ACT inhaler Inhale 1-2 puffs into the lungs every 6 (six) hours as needed for wheezing or shortness of breath.   ALPRAZolam (XANAX) 1 MG tablet Take 1 mg by mouth 4 (four) times daily as needed for anxiety.   aspirin EC 81 MG tablet Take 81 mg by mouth daily.   Cholecalciferol (VITAMIN D3) 50000 units CAPS Take 1 capsule by mouth every Monday.    Erenumab-aooe (AIMOVIG) 70 MG/ML SOAJ Inject 70 mg into the skin every 30 (thirty) days.   fluticasone (FLONASE) 50 MCG/ACT nasal spray Place 2 sprays into both nostrils 3 (three) times daily.   lidocaine (XYLOCAINE) 5 % ointment Apply 1 application topically daily as needed.   meloxicam (MOBIC) 7.5  MG tablet Take 7.5 mg by mouth 2 (two) times daily as needed for pain.   nicotine (NICODERM CQ - DOSED IN MG/24 HOURS) 21 mg/24hr patch Place onto the skin.   omeprazole (PRILOSEC) 40 MG capsule Take 40 mg by mouth daily.   oxycodone (ROXICODONE) 30 MG immediate release tablet Take 30-60 mg by mouth every 4 (four) hours as needed for pain.   pantoprazole (PROTONIX) 40 MG tablet Take 40 mg by mouth 2 (two) times daily.   PARoxetine (PAXIL) 40 MG tablet Take 40 mg by mouth daily.    promethazine (PHENERGAN) 25 MG tablet Take 25 mg by mouth every 6  (six) hours as needed for nausea or vomiting.   rizatriptan (MAXALT) 10 MG tablet As needed for headache; may repeat in 2 hours if needed; max 2 per day or 8 per month              Objective:    wt   01/17/2023        150  10/02/12 155 lb 9.6 oz (70.58 kg)  02/06/10 147 lb 12.8 oz (67.042 kg)  10/31/09 148 lb 8 oz (67.359 kg)     Vital signs reviewed  01/17/2023  - Note at rest 02 sats  96% on RA   General appearance:    amb slt hoarse wf nad    HEENT : Oropharynx  clear      Nasal turbinates mild non-specific turbinate edema   NECK :  without  apparent JVD/ palpable Nodes/TM    LUNGS: no acc muscle use,  Nl contour chest which is clear to A and P bilaterally without cough on insp or exp maneuvers   CV:  RRR  no s3 or murmur or increase in P2, and no edema   ABD:  soft and nontender with nl inspiratory excursion in the supine position. No bruits or organomegaly appreciated   MS:  Nl gait/ ext warm without deformities Or obvious joint restrictions  calf tenderness, cyanosis or clubbing    SKIN: warm and dry without lesions    NEURO:  alert, approp, nl sensorium with  no motor or cerebellar deficits apparent.       01/17/2023 did not go for cxr as rec  Assessment & Plan:

## 2023-01-17 NOTE — Assessment & Plan Note (Addendum)
This is an extremely common benign condition in the elderly and does not warrant aggressive eval/ rx at this point unless there is a clinical correlation suggesting unaddressed pulmonary infection (purulent sputum, night sweats, unintended wt loss, doe) or evolution of  obvious changes on plain cxr (as opposed to serial CT, which is way over sensitive to make clinical decisions re intervention and treatment in the elderly, who tend to tolerate both dx and treatment poorly) .   >>> check cxr pending   Each maintenance medication was reviewed in detail including emphasizing most importantly the difference between maintenance and prns and under what circumstances the prns are to be triggered using an action plan format where appropriate.  Total time for H and P, chart review, counseling, reviewing hfa device(s) , directly observing portions of ambulatory 02 saturation study/ and generating customized AVS unique to this office visit / same day charting = 60 min with pt not seen in > 10 years

## 2023-01-17 NOTE — Assessment & Plan Note (Signed)
4-5 min discussion re active cigarette smoking in addition to office E&M  Ask about tobacco use:   ongoing Advise quitting  I took an extended  opportunity with this patient to outline the consequences of continued cigarette use  in airway disorders based on all the data we have from the multiple national lung health studies (perfomed over decades at millions of dollars in cost)  indicating that smoking cessation, not choice of inhalers or physicians, is the most important aspect of her care.   Assess willingness:  Not committed at this point Assist in quit attempt:  Per PCP when ready Arrange follow up:   Follow up per Primary Care planned   Will need baseline pfts > ordered and eligible for LDSCT which we can schedule thru this office if she needs regular f/u here.

## 2023-01-17 NOTE — Assessment & Plan Note (Signed)
11/2003: Normal PFTs ex minimal increased volumes and decreased the DLCO - 01/17/2023   Walked on RA  x  3  lap(s) =  approx 450  ft  @ rapid pace, stopped due to end of study, min sob with lowest 02 sats 98%  - 01/17/2023  After extensive coaching inhaler device,  effectiveness =    50% (early trigger, later Insp) > air supra sample/ albuterol prn pending pfts  Pt is Group A in terms of symptom/risk and saba  therefore appropriate rx at this point >>>  await pfts prior to considering adding maint rx, esp in view of MAC concern (perhaps lama/laba best choice and leave off ICS)  F/u p pfts if needed    -

## 2023-01-20 ENCOUNTER — Telehealth: Payer: Self-pay | Admitting: Internal Medicine

## 2023-01-20 ENCOUNTER — Ambulatory Visit (HOSPITAL_COMMUNITY)
Admission: RE | Admit: 2023-01-20 | Discharge: 2023-01-20 | Disposition: A | Discharge status: Home / Self Care | Payer: Medicaid Other | Source: Ambulatory Visit | Attending: Internal Medicine | Admitting: Internal Medicine

## 2023-01-20 DIAGNOSIS — J449 Chronic obstructive pulmonary disease, unspecified: Secondary | ICD-10-CM | POA: Diagnosis present

## 2023-01-20 NOTE — Telephone Encounter (Signed)
Dr., Juanetta Gosling and another doc, at the bottom of pt throat they found little pebbles/ bubbles, like little diamonds. Please advise pt as to what she should do

## 2023-01-21 NOTE — Telephone Encounter (Signed)
I reviewed the notes and nothing of concern - we can discuss further at next ov

## 2023-01-21 NOTE — Telephone Encounter (Signed)
Patient states years ago a Dr. Juanetta Gosling found little pebbles/bubbles in her throat when she had surgery. She wanted information to go to Dr. Sherene Sires as an Lorain Childes

## 2023-01-22 ENCOUNTER — Other Ambulatory Visit (HOSPITAL_COMMUNITY): Payer: Self-pay | Admitting: Internal Medicine

## 2023-01-22 DIAGNOSIS — R221 Localized swelling, mass and lump, neck: Secondary | ICD-10-CM

## 2023-01-23 NOTE — Telephone Encounter (Signed)
This encounter was created in error - please disregard.

## 2023-01-23 NOTE — Telephone Encounter (Addendum)
Called.  Spoke with patient.  Gave information.  Patient needs to schedule PFT per Dr. Sherene Sires  AVS = PFTs next available at [facility] and I will call you with results and go from there.

## 2023-01-23 NOTE — Telephone Encounter (Deleted)
C-

## 2023-01-27 ENCOUNTER — Ambulatory Visit (HOSPITAL_COMMUNITY)
Admission: RE | Admit: 2023-01-27 | Discharge: 2023-01-27 | Disposition: A | Discharge status: Home / Self Care | Payer: Medicaid Other | Source: Ambulatory Visit | Attending: Internal Medicine | Admitting: Internal Medicine

## 2023-01-27 ENCOUNTER — Ambulatory Visit (HOSPITAL_COMMUNITY): Payer: Medicaid Other

## 2023-01-27 DIAGNOSIS — E2839 Other primary ovarian failure: Secondary | ICD-10-CM

## 2023-02-05 ENCOUNTER — Ambulatory Visit (HOSPITAL_COMMUNITY): Payer: Medicaid Other

## 2023-02-05 ENCOUNTER — Encounter (HOSPITAL_COMMUNITY): Payer: Self-pay

## 2023-02-05 ENCOUNTER — Ambulatory Visit (HOSPITAL_COMMUNITY): Payer: Medicaid Other | Attending: Internal Medicine

## 2023-02-11 ENCOUNTER — Other Ambulatory Visit: Payer: Self-pay | Admitting: *Deleted

## 2023-02-11 ENCOUNTER — Telehealth: Payer: Self-pay | Admitting: *Deleted

## 2023-02-11 ENCOUNTER — Encounter: Payer: Self-pay | Admitting: Gastroenterology

## 2023-02-11 ENCOUNTER — Encounter: Payer: Self-pay | Admitting: *Deleted

## 2023-02-11 ENCOUNTER — Ambulatory Visit (INDEPENDENT_AMBULATORY_CARE_PROVIDER_SITE_OTHER): Payer: Medicaid Other | Admitting: Gastroenterology

## 2023-02-11 VITALS — BP 121/76 | HR 73 | Temp 98.4°F | Ht 68.0 in | Wt 150.0 lb

## 2023-02-11 DIAGNOSIS — R160 Hepatomegaly, not elsewhere classified: Secondary | ICD-10-CM | POA: Diagnosis not present

## 2023-02-11 DIAGNOSIS — K219 Gastro-esophageal reflux disease without esophagitis: Secondary | ICD-10-CM | POA: Insufficient documentation

## 2023-02-11 DIAGNOSIS — Z1211 Encounter for screening for malignant neoplasm of colon: Secondary | ICD-10-CM | POA: Diagnosis not present

## 2023-02-11 DIAGNOSIS — R1010 Upper abdominal pain, unspecified: Secondary | ICD-10-CM | POA: Insufficient documentation

## 2023-02-11 DIAGNOSIS — R1319 Other dysphagia: Secondary | ICD-10-CM

## 2023-02-11 MED ORDER — PEG 3350-KCL-NA BICARB-NACL 420 G PO SOLR: 4000.0000 mL | Freq: Once | ORAL | 0 refills | Status: AC

## 2023-02-11 MED ORDER — DEXLANSOPRAZOLE 60 MG PO CPDR: 60.0000 mg | DELAYED_RELEASE_CAPSULE | Freq: Every day | ORAL | 3 refills | Status: DC

## 2023-02-11 NOTE — Patient Instructions (Signed)
Abdominal ultrasound to be ordered. Upper endoscopy and colonoscopy to be scheduled. Stop pantoprazole. Start Dexilant 60mg  daily before breakfast.

## 2023-02-11 NOTE — Telephone Encounter (Signed)
[employer] PA: Pending Tracking #: [id number]

## 2023-02-11 NOTE — Progress Notes (Signed)
GI Office Note    Referring Provider: Elfredia Nevins, MD Primary Care Physician:  Elfredia Nevins, MD  Primary Gastroenterologist: Roetta Sessions, MD   Chief Complaint   Chief Complaint  Patient presents with   Colonoscopy    Also wants endoscopy and liver evaluation.     History of Present Illness   Sharon Gay is a 57 y.o. female presenting today for follow-up.  Referred back by Dr. Sherwood Gambler for further evaluation of liver and for colonoscopy. Last seen in 2017.  Noted to have hepatomegaly on CT. Ultrasound with elastography with Metavir F2/F3 score. Previous positive H. pylori serology status post treatment. IR biopsy of liver completed with minimal steatosis, overall liver biopsy with minimal steatosis, overall normal.  Hepatitis C antibody negative. H/o chronic cervical lymphadenopathy referred to ENT back in 2017. It is not clear that she ever saw ENT. Scheduled for US neck this week.     Complains of burning/hurting pain in upper abdomen for few months. Comes at any time. Notices when laying. Not related to meals. A lot of nausea. Takes OTC Emitrol. No vomiting. Has pain from throat and into chest. Not sure if due to acid reflux. Not sure if pantoprazole 40mg  BID is helping. Wants to try Dexilant. Has taken her husband's Dexilant a few times and seems to help. Take Mobic at least every other day. Sometimes feels like swallowing is difficult, like things hesitate, has to swallowing several times. Small pills can get stuck. Sometimes solid food dysphagia. Avoids bread and french fries.   Patient states she was told she had MS years ago. New neurologist does not believe she has MS due to lack of findings on MRI. She has history of stroke like symptoms in 2019 with hospitalization but no evidence of stroke and likely migraine related.   For EGD and colonoscopy back in 2017 patient canceled again.  Has had numerous cancellations for colonoscopy/EGD as well as a no-show for her  procedure.  EGD 2003: Noncritical Schatzki ring status post dilation.  Small hiatal hernia.  TCS 09/2004: Normal rectum, 3 diminutive polyps removed unknown path), normal terminal ileum.  EGD 2009: Normal esophagus, very small hiatal hernia, status post dilation.    Medications   Current Outpatient Medications  Medication Sig Dispense Refill   albuterol (PROVENTIL HFA;VENTOLIN HFA) 108 (90 BASE) MCG/ACT inhaler Inhale 1-2 puffs into the lungs every 6 (six) hours as needed for wheezing or shortness of breath. 1 Inhaler 0   Albuterol-Budesonide (AIRSUPRA) 90-80 MCG/ACT AERO Inhale into the lungs.     ALPRAZolam (XANAX) 1 MG tablet Take 1 mg by mouth 4 (four) times daily as needed for anxiety.     aspirin EC 81 MG tablet Take 81 mg by mouth daily.     atorvastatin (LIPITOR) 10 MG tablet Take 10 mg by mouth daily.     cholecalciferol (VITAMIN D3) 25 MCG (1000 UNIT) tablet Take 1,000 Units by mouth daily.     Erenumab-aooe (AIMOVIG) 70 MG/ML SOAJ Inject 70 mg into the skin every 30 (thirty) days. 3 mL 4   fluticasone (FLONASE) 50 MCG/ACT nasal spray Place 2 sprays into both nostrils 3 (three) times daily.     lidocaine (XYLOCAINE) 5 % ointment Apply 1 application topically daily as needed.     meloxicam (MOBIC) 7.5 MG tablet Take 7.5 mg by mouth 2 (two) times daily as needed for pain.     nicotine (NICODERM CQ - DOSED IN MG/24 HOURS) 21 mg/24hr patch  Place onto the skin.     oxycodone (ROXICODONE) 30 MG immediate release tablet Take 30-60 mg by mouth every 4 (four) hours as needed for pain.     pantoprazole (PROTONIX) 40 MG tablet Take 40 mg by mouth 2 (two) times daily.     PARoxetine (PAXIL) 40 MG tablet Take 40 mg by mouth daily.      promethazine (PHENERGAN) 25 MG tablet Take 25 mg by mouth every 6 (six) hours as needed for nausea or vomiting.     rizatriptan (MAXALT) 10 MG tablet As needed for headache; may repeat in 2 hours if needed; max 2 per day or 8 per month 8 tablet 6   No  current facility-administered medications for this visit.    Allergies   Allergies as of 02/11/2023 - Review Complete 02/11/2023  Allergen Reaction Noted   Penicillins Anaphylaxis, Nausea And Vomiting, and Swelling 08/10/2012   Morphine Other (See Comments)    Rifampin Nausea Only and Other (See Comments) 02/06/2010   Topamax [topiramate] Other (See Comments) 10/26/2015   Cymbalta [duloxetine hcl] Other (See Comments) 10/26/2015   Tequin [gatifloxacin] Nausea And Vomiting 08/10/2012    Past Medical History   Past Medical History:  Diagnosis Date   AC (acromioclavicular) joint bone spurs    Anxiety    Arthritis    Back pain    Chest pain 2003   11/2007: Admission with unstable angina; normal coronary angiography  with [facility]; nl EF   Chronic pain    Colitis 2005   COPD (chronic obstructive pulmonary disease) (HCC)    11/2003: Normal PFTs ex minimal increased volumes and decreased the DLCO; Followed in Pulmonary clinic/ [facility]/ [place]    Depression    Gastroesophageal reflux disease 2009   Hiatal hernia; esophageal dilatation for Schatzki's ring in 04/2008; chronic constipation; irritable bowel syndrome   History of pneumonia    Hyperlipidemia 2009   Lipid profile 07/2012:227, 91, 56, 153   Lung disease 2010   Migraines    Mitral valve prolapse    Mitral valve prolapse    MS (multiple sclerosis) (HCC)    MS (multiple sclerosis) (HCC) 2001   Mycobacterium avium complex (HCC) 2010   01/2009: + 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 Jena Gauss: 3 diminutive polyps in descending colon, unknown path    ESOPHAGOGASTRODUODENOSCOPY  2009   Dr. Jena Gauss: normal esophagus, small hiatal hernia, s/p empiric dilatilon    ESOPHAGOGASTRODUODENOSCOPY  2003    Schatzki's ring s/p dilation   FIBEROPTIC BRONCHOSCOPY  2010   LIVER BIOPSY     ORIF RADIAL FRACTURE  2012   Left   VOCAL CORD POLYP REMOVED  2003    Past Family History   Family History  Problem Relation Age of Onset   Emphysema Maternal Grandfather        was a smoker   Arthritis Sister    Mental retardation Sister    Heart disease Sister        Also brother; cardiomyopathy   Thyroid disease Sister    Fibromyalgia Sister    Arthritis Sister    Migraines Sister    COPD Sister    Heart attack Father 60   Arthritis Sister    Migraines Sister  Other Sister        brain tumor   Arthritis Brother    Arthritis Brother    Colon cancer Neg Hx     Past Social History   Social History   Socioeconomic History   Marital status: Married    Spouse name: Not on file   Number of children: 2   Years of education: Not on file   Highest education level: Not on file  Occupational History   Occupation: Automotive  Tobacco Use   Smoking status: Every Day    Packs/day: 0.25    Years: 30.00    Additional pack years: 0.00    Total pack years: 7.50    Types: Cigarettes   Smokeless tobacco: Never   Tobacco comments:    smokes 4-5 cig daily  Vaping Use   Vaping Use: Never used  Substance and Sexual Activity   Alcohol use: No   Drug use: No   Sexual activity: Yes    Birth control/protection: Post-menopausal  Other Topics Concern   Not on file  Social History Narrative   2 stepchildren   Social Determinants of Health   Financial Resource Strain: Medium Risk (06/22/2020)   Overall Financial Resource Strain (CARDIA)    Difficulty of Paying Living Expenses: Somewhat hard  Food Insecurity: No Food Insecurity (06/22/2020)   Hunger Vital Sign    Worried About Running Out of Food in the Last Year: Never true    Ran Out of Food in the Last Year: Never true  Transportation Needs: No Transportation Needs (06/22/2020)   PRAPARE - Administrator, Civil Service  (Medical): No    Lack of Transportation (Non-Medical): No  Physical Activity: Sufficiently Active (06/22/2020)   Exercise Vital Sign    Days of Exercise per Week: 5 days    Minutes of Exercise per Session: 30 min  Stress: Stress Concern Present (06/22/2020)   [employer] of Occupational Health - Occupational Stress Questionnaire    Feeling of Stress : Very much  Social Connections: Socially Integrated (06/22/2020)   Social Connection and Isolation Panel [NHANES]    Frequency of Communication with Friends and Family: More than three times a week    Frequency of Social Gatherings with Friends and Family: Once a week    Attends Religious Services: More than 4 times per year    Active Member of [employer] or Organizations: Yes    Attends Banker Meetings: 1 to 4 times per year    Marital Status: Married  Catering manager Violence: Not At Risk (06/22/2020)   Humiliation, Afraid, Rape, and Kick questionnaire    Fear of Current or Ex-Partner: No    Emotionally Abused: No    Physically Abused: No    Sexually Abused: No    Review of Systems   General: Negative for anorexia, weight loss, fever, chills, fatigue, weakness. Eyes: Negative for vision changes.  ENT: Negative for hoarseness,  nasal congestion. See hpi CV: Negative for chest pain, angina, palpitations, dyspnea on exertion, peripheral edema.  Respiratory: Negative for dyspnea at rest, +dyspnea on exertion, cough, sputum, wheezing.  GI: See history of present illness. GU:  Negative for dysuria, hematuria, urinary incontinence, urinary frequency, nocturnal urination.  MS: Negative for joint pain, low back pain.  Derm: Negative for rash or itching.  Neuro: Negative for weakness, abnormal sensation, seizure, frequent headaches, memory loss,  confusion.  Psych: Negative for  depression, suicidal ideation, hallucinations. +anxiety Endo: Negative for unusual weight change.  Heme: Negative for bruising or  bleeding. Allergy: Negative for rash or hives.  Physical Exam   BP 121/76 (BP Location: Right Arm, Patient Position: Sitting, Cuff Size: Normal)   Pulse 73   Temp 98.4 F (36.9 C) (Oral)   Ht 5\' 8"  (1.727 m)   Wt 150 lb (68 kg)   SpO2 98%   BMI 22.81 kg/m    General: Well-nourished, well-developed in no acute distress.  Head: Normocephalic, atraumatic.   Eyes: Conjunctiva pink, no icterus. Mouth: Oropharyngeal mucosa moist and pink   Neck: Supple without thyromegaly, masses, or lymphadenopathy.  Lungs: Clear to auscultation bilaterally.  Heart: Regular rate and rhythm, no murmurs rubs or gallops.  Abdomen: Bowel sounds are normal, nontender, nondistended, no hepatosplenomegaly or masses,  no abdominal bruits or hernia, no rebound or guarding.   Rectal: not performed Extremities: No lower extremity edema. No clubbing or deformities.  Neuro: Alert and oriented x 4 , grossly normal neurologically.  Skin: Warm and dry, no rash or jaundice.   Psych: Alert and cooperative, normal mood and affect.  Labs   Labs from December 18, 2022: White blood cell count 6400, hemoglobin 15.6, platelets 320,000, BUN 9, creatinine 0.91, albumin 4.6, total bilirubin 0.2, alkaline phosphatase 83, AST 20, ALT 12, B12 600, folate 3.3, TSH 1.750, vitamin D 36.9.  Imaging Studies   DG BONE DENSITY (DXA)  Result Date: 01/27/2023 EXAM: DUAL X-RAY ABSORPTIOMETRY (DXA) FOR BONE MINERAL DENSITY IMPRESSION: Your patient Rystal Holt completed a BMD test on 01/27/2023 using the [employer] DXA System (software version: 14.10) manufactured by [employer]. The following summarizes the results of our evaluation. Technologist: PATIENT BIOGRAPHICAL: Name: Aprill, Ramus Patient ID: [id number] Birth Date: 05-Jun-1966 Height: 68.0 in. Gender: Female Exam Date: 01/27/2023 Weight: 150.2 lbs. Indications: History of Fracture (Adult), Tobacco User (Current Smoker) Fractures: Wrist Treatments: Vitamin D  DENSITOMETRY RESULTS: Site      Region    Measured Date Measured Age WHO Classification Young Adult T-score BMD         %Change vs. Previous Significant Change (*) AP Spine L1-L4 01/27/2023 56.9 Osteopenia -2.0 0.943 g/cm2 DualFemur Neck Left 01/27/2023 56.9 Osteopenia -1.9 0.779 g/cm2 ASSESSMENT: The BMD measured at Femur Neck Left is 0.779 g/cm2 with a T-score of -1.9. This patient is considered osteopenic according to World Health Organization [facility]) criteria. The scan quality is good. World Science writer [facility]) criteria for post-menopausal, Caucasian Women: Normal:       T-score at or above -1 SD Osteopenia:   T-score between -1 and -2.5 SD Osteoporosis: T-score at or below -2.5 SD RECOMMENDATIONS: 1. All patients should optimize calcium and vitamin D intake. 2. Consider FDA-approved medical therapies in postmenopausal women and med aged 38 years and older, based on the following: a. A hip or vertebral (clinical or morphometric) fracture b. T-score< -2.5 at the femoral neck or spine after appropriate evaluation to exclude secondary causes c. Low bone mass (T-score between -1.0 and -2.5 at the femoral neck or spine) and a 10-year probability of a hip fracture > 3% or a 10-year probability of a major osteoporosis-related fracture > 20% based on the [place]-adapted WHO algorithm d. Clinician judgment and/or patient preferences may indicate treatment for people with 10-year fracture probabilities above or below these levels FOLLOW-UP: Patients with diagnosis of osteoporosis or at high risk for fracture should have regular bone mineral density tests. For patients eligible for Medicare, routine testing is allowed once every 2 years. The testing frequency can  be increased to one year for patients who have rapidly progressing disease, those who are receiving or discontinuing medical therapy to restore bone mass, or have additional risk factors. I have reviewed this report, and agree with the above findings. [facility] Your patient KALLAN BISCHOFF completed a FRAX assessment on 01/27/2023 using the [employer] DXA System (analysis version: 14.10) manufactured by [employer]. The following summarizes the results of our evaluation. PATIENT BIOGRAPHICAL: Name: Alianna, Wurster Patient ID: [id number] Birth Date: 1966/03/08 Height:    68.0 in. Gender:     [age]    [age]       Weight:    150.2 lbs. Ethnicity:  White                            Exam Date: 01/27/2023 FRAX* RESULTS:  (version: 3.5) 10-year Probability of Fracture1 Major Osteoporotic Fracture2 Hip Fracture 14.1% 2.9% Population: [place] (Caucasian) Risk Factors: History of Fracture (Adult), Tobacco User (Current Smoker) Based on Femur (Left) Neck BMD 1 -The 10-year probability of fracture may be lower than reported if the patient has received treatment. 2 -Major Osteoporotic Fracture: Clinical Spine, Forearm, Hip or Shoulder *FRAX is a Armed forces logistics/support/administrative officer of the [employer] of [employer] for Metabolic Bone Disease, a World Science writer (WHO) [employer]. ASSESSMENT: The probability of a major osteoporotic fracture is 14.1% within the next ten years. The probability of a hip fracture is 2.9% within the next ten years. Electronically Signed   By: Frederico Hamman M.D.   On: 01/27/2023 13:20   DG Chest 2 View  Result Date: 01/23/2023 CLINICAL DATA:  Shortness of breath EXAM: CHEST - 2 VIEW COMPARISON:  Chest radiograph 11/26/2017 FINDINGS: The heart size and mediastinal contours are within normal limits. Both lungs are clear. The visualized skeletal structures are unremarkable. IMPRESSION: No active cardiopulmonary disease. Electronically Signed   By: Annia Belt M.D.   On: 01/23/2023 12:15    Assessment   GERD/dysphagia/upper abdominal pain: typical reflux poorly controlled. May have developed esophageal stricture given dysphagia. Cannot rule out gastritis/ulcers in setting of mobic.   Hepatomegaly: history of  hepatomegaly. 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 Percutaneous liver biopsy as described above with minimal steatosis. Needs reevaluation.  Colon cancer screening: overdue for colonoscopy   PLAN   Stop pantoprazole. Start Dexilant 60mg  daily. Abdominal ultrasound. EGD/ED/colonoscopy. ASA 3.  I have discussed the risks, alternatives, benefits with regards to but not limited to the risk of reaction to medication, bleeding, infection, perforation and the patient is agreeable to proceed. Written consent to be obtained.    Leanna Battles. Melvyn Neth, MHS, PA-C [facility]

## 2023-02-12 ENCOUNTER — Ambulatory Visit (HOSPITAL_COMMUNITY)
Admission: RE | Admit: 2023-02-12 | Discharge: 2023-02-12 | Disposition: A | Discharge status: Home / Self Care | Payer: Medicaid Other | Source: Ambulatory Visit | Attending: Gastroenterology | Admitting: Gastroenterology

## 2023-02-12 ENCOUNTER — Other Ambulatory Visit (HOSPITAL_COMMUNITY): Payer: Self-pay | Admitting: Internal Medicine

## 2023-02-12 ENCOUNTER — Ambulatory Visit (HOSPITAL_COMMUNITY)
Admission: RE | Admit: 2023-02-12 | Discharge: 2023-02-12 | Disposition: A | Discharge status: Home / Self Care | Payer: Medicaid Other | Source: Ambulatory Visit | Attending: Internal Medicine | Admitting: Internal Medicine

## 2023-02-12 DIAGNOSIS — G459 Transient cerebral ischemic attack, unspecified: Secondary | ICD-10-CM

## 2023-02-12 DIAGNOSIS — R1010 Upper abdominal pain, unspecified: Secondary | ICD-10-CM | POA: Insufficient documentation

## 2023-02-12 DIAGNOSIS — R221 Localized swelling, mass and lump, neck: Secondary | ICD-10-CM | POA: Diagnosis present

## 2023-02-12 DIAGNOSIS — R1319 Other dysphagia: Secondary | ICD-10-CM | POA: Diagnosis present

## 2023-02-12 DIAGNOSIS — R160 Hepatomegaly, not elsewhere classified: Secondary | ICD-10-CM | POA: Diagnosis present

## 2023-02-12 DIAGNOSIS — Z1211 Encounter for screening for malignant neoplasm of colon: Secondary | ICD-10-CM | POA: Diagnosis present

## 2023-02-12 NOTE — Telephone Encounter (Signed)
[employer] PA: Approval # [id number] DOS: 04/02/23-06/09/23

## 2023-02-14 ENCOUNTER — Encounter: Payer: Self-pay | Admitting: Gastroenterology

## 2023-02-26 ENCOUNTER — Encounter (HOSPITAL_COMMUNITY): Payer: Self-pay

## 2023-02-26 ENCOUNTER — Ambulatory Visit (HOSPITAL_COMMUNITY): Admission: RE | Admit: 2023-02-26 | Payer: Medicaid Other | Source: Ambulatory Visit

## 2023-02-27 ENCOUNTER — Encounter (HOSPITAL_COMMUNITY): Payer: Self-pay | Admitting: Internal Medicine

## 2023-03-12 ENCOUNTER — Ambulatory Visit (HOSPITAL_COMMUNITY)
Admission: RE | Admit: 2023-03-12 | Discharge: 2023-03-12 | Disposition: A | Discharge status: Home / Self Care | Payer: Medicaid Other | Source: Ambulatory Visit | Attending: Internal Medicine | Admitting: Internal Medicine

## 2023-03-12 DIAGNOSIS — G459 Transient cerebral ischemic attack, unspecified: Secondary | ICD-10-CM | POA: Diagnosis present

## 2023-03-25 ENCOUNTER — Ambulatory Visit (HOSPITAL_COMMUNITY)
Admission: RE | Admit: 2023-03-25 | Discharge: 2023-03-25 | Disposition: A | Discharge status: Home / Self Care | Payer: Medicaid Other | Source: Ambulatory Visit | Attending: Internal Medicine | Admitting: Internal Medicine

## 2023-03-25 DIAGNOSIS — J449 Chronic obstructive pulmonary disease, unspecified: Secondary | ICD-10-CM | POA: Insufficient documentation

## 2023-03-25 LAB — PULMONARY FUNCTION TEST
DL/VA % pred: 76 %
DL/VA: 3.15 ml/min/mmHg/L
DLCO unc % pred: 67 %
DLCO unc: 15.19 ml/min/mmHg
FEF 25-75 Post: 2.38 L/sec
FEF 25-75 Pre: 1.1 L/sec
FEF2575-%Change-Post: 116 %
FEF2575-%Pred-Post: 89 %
FEF2575-%Pred-Pre: 41 %
FEV1-%Change-Post: 17 %
FEV1-%Pred-Post: 83 %
FEV1-%Pred-Pre: 71 %
FEV1-Post: 2.46 L
FEV1-Pre: 2.09 L
FEV1FVC-%Change-Post: 8 %
FEV1FVC-%Pred-Pre: 86 %
FEV6-%Change-Post: 11 %
FEV6-%Pred-Post: 87 %
FEV6-%Pred-Pre: 78 %
FEV6-Post: 3.18 L
FEV6-Pre: 2.86 L
FEV6FVC-%Change-Post: 2 %
FEV6FVC-%Pred-Post: 99 %
FEV6FVC-%Pred-Pre: 96 %
FVC-%Change-Post: 8 %
FVC-%Pred-Post: 88 %
FVC-%Pred-Pre: 81 %
FVC-Post: 3.31 L
FVC-Pre: 3.06 L
Post FEV1/FVC ratio: 74 %
Post FEV6/FVC ratio: 96 %
Pre FEV1/FVC ratio: 68 %
Pre FEV6/FVC Ratio: 94 %
RV % pred: 140 %
RV: 2.92 L
TLC % pred: 110 %
TLC: 6.06 L

## 2023-03-25 MED ORDER — ALBUTEROL SULFATE (2.5 MG/3ML) 0.083% IN NEBU
2.5000 mg | INHALATION_SOLUTION | Freq: Once | RESPIRATORY_TRACT | Status: AC
  Administered 2023-03-25: 2.5 mg via RESPIRATORY_TRACT

## 2023-03-26 ENCOUNTER — Ambulatory Visit: Payer: Medicaid Other | Admitting: Adult Health

## 2023-03-27 ENCOUNTER — Encounter: Payer: Self-pay | Admitting: Internal Medicine

## 2023-03-27 NOTE — Patient Instructions (Signed)
Sharon Gay  03/27/2023     @PREFPERIOPPHARMACY @   Your procedure is scheduled on  04/02/2023.   Report to [name] at  0800  A.M.   Call this number if you have problems the morning of surgery:  [phone]  If you experience any cold or flu symptoms such as cough, fever, chills, shortness of breath, etc. between now and your scheduled surgery, please notify us at the above number.   Remember:  Follow the diet and prep instructions given to you by the office.     Use your inhaler before you come and bring your rescue inhaler with you.     Take these medicines the morning of surgery with A SIP OF WATER             xanax(if needed), dexilant, gabapentin, meloxicam (if needed), paxil, maxalt(if needed).     Do not wear jewelry, make-up or nail polish, including gel polish,  artificial nails, or any other type of covering on natural nails (fingers and  toes).  Do not wear lotions, powders, or perfumes, or deodorant.  Do not shave 48 hours prior to surgery.  Men may shave face and neck.  Do not bring valuables to the hospital.  [facility] is not responsible for any belongings or valuables.  Contacts, dentures or bridgework may not be worn into surgery.  Leave your suitcase in the car.  After surgery it may be brought to your room.  For patients admitted to the hospital, discharge time will be determined by your treatment team.  Patients discharged the day of surgery will not be allowed to drive home and must have someone with them for 24 hours.    Special instructions:   DO NOT smoke tobacco or vape for 24 hours before your procedure.  Please read over the following fact sheets that you were given. Anesthesia Post-op Instructions and Care and Recovery After Surgery      Upper Endoscopy, Adult, Care After After the procedure, it is common to have a sore throat. It is also common to have: Mild stomach pain or  discomfort. Bloating. Nausea. Follow these instructions at home: The instructions below may help you care for yourself at home. Your health care provider may give you more instructions. If you have questions, ask your health care provider. If you were given a sedative during the procedure, it can affect you for several hours. Do not drive or operate machinery until your health care provider says that it is safe. If you will be going home right after the procedure, plan to have a responsible adult: Take you home from the hospital or clinic. You will not be allowed to drive. Care for you for the time you are told. Follow instructions from your health care provider about what you may eat and drink. Return to your normal activities as told by your health care provider. Ask your health care provider what activities are safe for you. Take over-the-counter and prescription medicines only as told by your health care provider. Contact a health care provider if you: Have a sore throat that lasts longer than one day. Have trouble swallowing. Have a fever. Get help right away if you: Vomit blood or your vomit looks like coffee grounds. Have bloody, black, or tarry stools. Have a very bad sore throat or you cannot swallow. Have difficulty breathing or very bad pain in your chest or abdomen. These symptoms may  be an emergency. Get help right away. Call 911. Do not wait to see if the symptoms will go away. Do not drive yourself to the hospital. Summary After the procedure, it is common to have a sore throat, mild stomach discomfort, bloating, and nausea. If you were given a sedative during the procedure, it can affect you for several hours. Do not drive until your health care provider says that it is safe. Follow instructions from your health care provider about what you may eat and drink. Return to your normal activities as told by your health care provider. This information is not intended to replace  advice given to you by your health care provider. Make sure you discuss any questions you have with your health care provider. Document Revised: 12/05/2021 Document Reviewed: 12/05/2021 Elsevier Patient Education  2024 [employer] Esophageal Dilatation Esophageal dilatation, also called esophageal dilation, is a procedure to widen or open a blocked or narrowed part of the esophagus. The esophagus is the part of the body that moves food and liquid from the mouth to the stomach. You may need this procedure if: You have a buildup of scar tissue in your esophagus that makes it difficult, painful, or impossible to swallow. This can be caused by gastroesophageal reflux disease (GERD). You have cancer of the esophagus. There is a problem with how food moves through your esophagus. In some cases, you may need this procedure repeated at a later time to dilate the esophagus gradually. Tell a health care provider about: Any allergies you have. All medicines you are taking, including vitamins, herbs, eye drops, creams, and over-the-counter medicines. Any problems you or family members have had with anesthetic medicines. Any blood disorders you have. Any surgeries you have had. Any medical conditions you have. Any antibiotic medicines you are required to take before dental procedures. Whether you are pregnant or may be pregnant. What are the risks? Generally, this is a safe procedure. However, problems may occur, including: Bleeding due to a tear in the lining of the esophagus. A hole, or perforation, in the esophagus. What happens before the procedure? Ask your health care provider about: Changing or stopping your regular medicines. This is especially important if you are taking diabetes medicines or blood thinners. Taking medicines such as aspirin and ibuprofen. These medicines can thin your blood. Do not take these medicines unless your health care provider tells you to take them. Taking  over-the-counter medicines, vitamins, herbs, and supplements. Follow instructions from your health care provider about eating or drinking restrictions. Plan to have a responsible adult take you home from the hospital or clinic. Plan to have a responsible adult care for you for the time you are told after you leave the hospital or clinic. This is important. What happens during the procedure? You may be given a medicine to help you relax (sedative). A numbing medicine may be sprayed into the back of your throat, or you may gargle the medicine. Your health care provider may perform the dilatation using various surgical instruments, such as: Simple dilators. This instrument is carefully placed in the esophagus to stretch it. Guided wire bougies. This involves using an endoscope to insert a wire into the esophagus. A dilator is passed over this wire to enlarge the esophagus. Then the wire is removed. Balloon dilators. An endoscope with a small balloon is inserted into the esophagus. The balloon is inflated to stretch the esophagus and open it up. The procedure may vary among health care providers and hospitals.  What can I expect after the procedure? Your blood pressure, heart rate, breathing rate, and blood oxygen level will be monitored until you leave the hospital or clinic. Your throat may feel slightly sore and numb. This will get better over time. You will not be allowed to eat or drink until your throat is no longer numb. When you are able to drink, urinate, and sit on the edge of the bed without nausea or dizziness, you may be able to return home. Follow these instructions at home: Take over-the-counter and prescription medicines only as told by your health care provider. If you were given a sedative during the procedure, it can affect you for several hours. Do not drive or operate machinery until your health care provider says that it is safe. Plan to have a responsible adult care for you for  the time you are told. This is important. Follow instructions from your health care provider about any eating or drinking restrictions. Do not use any products that contain nicotine or tobacco, such as cigarettes, e-cigarettes, and chewing tobacco. If you need help quitting, ask your health care provider. Keep all follow-up visits. This is important. Contact a health care provider if: You have a fever. You have pain that is not relieved by medicine. Get help right away if: You have chest pain. You have trouble breathing. You have trouble swallowing. You vomit blood. You have black, tarry, or bloody stools. These symptoms may represent a serious problem that is an emergency. Do not wait to see if the symptoms will go away. Get medical help right away. Call your local emergency services (911 in the [place] Do not drive yourself to the hospital. Summary Esophageal dilatation, also called esophageal dilation, is a procedure to widen or open a blocked or narrowed part of the esophagus. Plan to have a responsible adult take you home from the hospital or clinic. For this procedure, a numbing medicine may be sprayed into the back of your throat, or you may gargle the medicine. Do not drive or operate machinery until your health care provider says that it is safe. This information is not intended to replace advice given to you by your health care provider. Make sure you discuss any questions you have with your health care provider. Document Revised: 01/12/2020 Document Reviewed: 01/12/2020 Elsevier Patient Education  2024 [employer] Colonoscopy, Adult, Care After The following information offers guidance on how to care for yourself after your procedure. Your health care provider may also give you more specific instructions. If you have problems or questions, contact your health care provider. What can I expect after the procedure? After the procedure, it is common to have: A small amount of blood  in your stool for 24 hours after the procedure. Some gas. Mild cramping or bloating of your abdomen. Follow these instructions at home: Eating and drinking  Drink enough fluid to keep your urine pale yellow. Follow instructions from your health care provider about eating or drinking restrictions. Resume your normal diet as told by your health care provider. Avoid heavy or fried foods that are hard to digest. Activity Rest as told by your health care provider. Avoid sitting for a long time without moving. Get up to take short walks every 1-2 hours. This is important to improve blood flow and breathing. Ask for help if you feel weak or unsteady. Return to your normal activities as told by your health care provider. Ask your health care provider what activities are safe for  you. Managing cramping and bloating  Try walking around when you have cramps or feel bloated. If directed, apply heat to your abdomen as told by your health care provider. Use the heat source that your health care provider recommends, such as a moist heat pack or a heating pad. Place a towel between your skin and the heat source. Leave the heat on for 20-30 minutes. Remove the heat if your skin turns bright red. This is especially important if you are unable to feel pain, heat, or cold. You have a greater risk of getting burned. General instructions If you were given a sedative during the procedure, it can affect you for several hours. Do not drive or operate machinery until your health care provider says that it is safe. For the first 24 hours after the procedure: Do not sign important documents. Do not drink alcohol. Do your regular daily activities at a slower pace than normal. Eat soft foods that are easy to digest. Take over-the-counter and prescription medicines only as told by your health care provider. Keep all follow-up visits. This is important. Contact a health care provider if: You have blood in your stool  2-3 days after the procedure. Get help right away if: You have more than a small spotting of blood in your stool. You have large blood clots in your stool. You have swelling of your abdomen. You have nausea or vomiting. You have a fever. You have increasing pain in your abdomen that is not relieved with medicine. These symptoms may be an emergency. Get help right away. Call 911. Do not wait to see if the symptoms will go away. Do not drive yourself to the hospital. Summary After the procedure, it is common to have a small amount of blood in your stool. You may also have mild cramping and bloating of your abdomen. If you were given a sedative during the procedure, it can affect you for several hours. Do not drive or operate machinery until your health care provider says that it is safe. Get help right away if you have a lot of blood in your stool, nausea or vomiting, a fever, or increased pain in your abdomen. This information is not intended to replace advice given to you by your health care provider. Make sure you discuss any questions you have with your health care provider. Document Revised: 10/08/2022 Document Reviewed: 04/18/2021 Elsevier Patient Education  2024 [employer]. Monitored Anesthesia Care, Care After The following information offers guidance on how to care for yourself after your procedure. Your health care provider may also give you more specific instructions. If you have problems or questions, contact your health care provider. What can I expect after the procedure? After the procedure, it is common to have: Tiredness. Little or no memory about what happened during or after the procedure. Impaired judgment when it comes to making decisions. Nausea or vomiting. Some trouble with balance. Follow these instructions at home: For the time period you were told by your health care provider:  Rest. Do not participate in activities where you could fall or become  injured. Do not drive or use machinery. Do not drink alcohol. Do not take sleeping pills or medicines that cause drowsiness. Do not make important decisions or sign legal documents. Do not take care of children on your own. Medicines Take over-the-counter and prescription medicines only as told by your health care provider. If you were prescribed antibiotics, take them as told by your health care provider. Do not  stop using the antibiotic even if you start to feel better. Eating and drinking Follow instructions from your health care provider about what you may eat and drink. Drink enough fluid to keep your urine pale yellow. If you vomit: Drink clear fluids slowly and in small amounts as you are able. Clear fluids include water, ice chips, low-calorie sports drinks, and fruit juice that has water added to it (diluted fruit juice). Eat light and bland foods in small amounts as you are able. These foods include bananas, applesauce, rice, lean meats, toast, and crackers. General instructions  Have a responsible adult stay with you for the time you are told. It is important to have someone help care for you until you are awake and alert. If you have sleep apnea, surgery and some medicines can increase your risk for breathing problems. Follow instructions from your health care provider about wearing your sleep device: When you are sleeping. This includes during daytime naps. While taking prescription pain medicines, sleeping medicines, or medicines that make you drowsy. Do not use any products that contain nicotine or tobacco. These products include cigarettes, chewing tobacco, and vaping devices, such as e-cigarettes. If you need help quitting, ask your health care provider. Contact a health care provider if: You feel nauseous or vomit every time you eat or drink. You feel light-headed. You are still sleepy or having trouble with balance after 24 hours. You get a rash. You have a fever. You  have redness or swelling around the IV site. Get help right away if: You have trouble breathing. You have new confusion after you get home. These symptoms may be an emergency. Get help right away. Call 911. Do not wait to see if the symptoms will go away. Do not drive yourself to the hospital. This information is not intended to replace advice given to you by your health care provider. Make sure you discuss any questions you have with your health care provider. Document Revised: 01/21/2022 Document Reviewed: 01/21/2022 Elsevier Patient Education  2024 [employer].

## 2023-03-28 ENCOUNTER — Other Ambulatory Visit (HOSPITAL_COMMUNITY): Payer: Self-pay

## 2023-03-28 MED ORDER — SYMBICORT 80-4.5 MCG/ACT IN AERO: 2.0000 | INHALATION_SPRAY | Freq: Two times a day (BID) | RESPIRATORY_TRACT | 5 refills | Status: DC

## 2023-03-31 ENCOUNTER — Encounter (HOSPITAL_COMMUNITY)
Admission: RE | Admit: 2023-03-31 | Discharge: 2023-03-31 | Disposition: A | Discharge status: Home / Self Care | Payer: Medicaid Other | Source: Ambulatory Visit | Attending: Internal Medicine | Admitting: Internal Medicine

## 2023-03-31 ENCOUNTER — Encounter (HOSPITAL_COMMUNITY): Payer: Self-pay

## 2023-03-31 DIAGNOSIS — F17210 Nicotine dependence, cigarettes, uncomplicated: Secondary | ICD-10-CM

## 2023-04-01 ENCOUNTER — Telehealth (INDEPENDENT_AMBULATORY_CARE_PROVIDER_SITE_OTHER): Payer: Self-pay | Admitting: *Deleted

## 2023-04-01 NOTE — Telephone Encounter (Signed)
Message sent to endo to cancel.  Called pt, no answer and VM not set up

## 2023-04-01 NOTE — Telephone Encounter (Signed)
Left VM she is was unable to do preop and needs to reschedule her procedure with Dr. Jena Gauss tomorrow due to her sister being in a horrible wreck   Very sorry   [phone]

## 2023-04-02 ENCOUNTER — Encounter (HOSPITAL_COMMUNITY): Admission: RE | Payer: Self-pay | Source: Home / Self Care

## 2023-04-02 ENCOUNTER — Ambulatory Visit (HOSPITAL_COMMUNITY): Admission: RE | Admit: 2023-04-02 | Payer: Medicaid Other | Source: Home / Self Care | Admitting: Internal Medicine

## 2023-04-02 SURGERY — COLONOSCOPY WITH PROPOFOL
Anesthesia: Monitor Anesthesia Care

## 2023-04-02 NOTE — Telephone Encounter (Signed)
Pt returned call. She is aware procedure was already cancelled. We will call her once we have future schedule to get her rescheduled.

## 2023-04-02 NOTE — Telephone Encounter (Signed)
Called pt, no answer and still not able to leave VM. Procedure was already cancelled.

## 2023-04-22 NOTE — Telephone Encounter (Signed)
Called pt, no answer and VM not set up 

## 2023-05-23 NOTE — Telephone Encounter (Signed)
Called pt. She has been scheduled for 10/14. She already has prep at home. Aware will send new instructions and will let know when new pre-op is scheduled for.    PA submitted via [employer]. PA pending [id number]

## 2023-05-26 ENCOUNTER — Encounter: Payer: Self-pay | Admitting: *Deleted

## 2023-05-27 NOTE — Telephone Encounter (Signed)
PA approved. 06/23/23-09/21/23

## 2023-06-19 ENCOUNTER — Encounter (HOSPITAL_COMMUNITY): Payer: Medicaid Other

## 2023-06-19 ENCOUNTER — Encounter (HOSPITAL_COMMUNITY): Payer: Self-pay

## 2023-06-19 ENCOUNTER — Encounter (HOSPITAL_COMMUNITY)
Admission: RE | Admit: 2023-06-19 | Discharge: 2023-06-19 | Disposition: A | Discharge status: Home / Self Care | Payer: Medicaid Other | Source: Ambulatory Visit | Attending: Internal Medicine | Admitting: Internal Medicine

## 2023-06-19 ENCOUNTER — Other Ambulatory Visit: Payer: Self-pay

## 2023-06-23 ENCOUNTER — Encounter (HOSPITAL_COMMUNITY): Admission: RE | Payer: Self-pay | Source: Home / Self Care

## 2023-06-23 ENCOUNTER — Encounter (HOSPITAL_COMMUNITY): Payer: Self-pay | Admitting: Certified Registered"

## 2023-06-23 ENCOUNTER — Ambulatory Visit (HOSPITAL_COMMUNITY): Admission: RE | Admit: 2023-06-23 | Payer: Medicaid Other | Source: Home / Self Care | Admitting: Internal Medicine

## 2023-06-23 ENCOUNTER — Telehealth: Payer: Self-pay | Admitting: *Deleted

## 2023-06-23 SURGERY — COLONOSCOPY WITH PROPOFOL
Anesthesia: Monitor Anesthesia Care

## 2023-06-23 NOTE — Telephone Encounter (Signed)
Patient will need OV prior to rescheduling. Please schedule thanks!

## 2023-06-23 NOTE — Telephone Encounter (Signed)
Hulan Fray, RN; Huguley, Selah, CMA; Elinor Dodge, LPN Patient LM late Sunday evening and stated that her prep is not working and she will need to reschedule.

## 2023-06-23 NOTE — Telephone Encounter (Signed)
-----   Message from Nurse Evlyn Kanner sent at 06/23/2023  7:34 AM EDT ----- Regarding: No Show for procedure Sharon Gay,  Sharon Gay was a no show for her procedure today.  She did not answer her phone and her mail box was not set up.  He husband did not answer his phone.  Thanks,  Cliffton Asters RN

## 2023-06-24 NOTE — Telephone Encounter (Signed)
I tried calling patient to schedule OV. No answer, no voice mail.

## 2023-06-25 NOTE — Telephone Encounter (Signed)
Pt aware of OV with [facility] on 10/22 at 330 pm

## 2023-07-01 ENCOUNTER — Encounter: Payer: Self-pay | Admitting: Gastroenterology

## 2023-07-01 ENCOUNTER — Ambulatory Visit: Payer: Medicaid Other | Admitting: Gastroenterology

## 2023-07-01 VITALS — BP 122/83 | HR 71 | Temp 97.5°F | Ht 69.0 in | Wt 149.2 lb

## 2023-07-01 DIAGNOSIS — R1310 Dysphagia, unspecified: Secondary | ICD-10-CM

## 2023-07-01 DIAGNOSIS — K219 Gastro-esophageal reflux disease without esophagitis: Secondary | ICD-10-CM

## 2023-07-01 DIAGNOSIS — K5900 Constipation, unspecified: Secondary | ICD-10-CM | POA: Diagnosis not present

## 2023-07-01 DIAGNOSIS — R1319 Other dysphagia: Secondary | ICD-10-CM

## 2023-07-01 DIAGNOSIS — R1010 Upper abdominal pain, unspecified: Secondary | ICD-10-CM | POA: Diagnosis not present

## 2023-07-01 DIAGNOSIS — R079 Chest pain, unspecified: Secondary | ICD-10-CM

## 2023-07-01 DIAGNOSIS — Z860100 Personal history of colon polyps, unspecified: Secondary | ICD-10-CM

## 2023-07-01 MED ORDER — LINACLOTIDE 290 MCG PO CAPS: 290.0000 ug | ORAL_CAPSULE | Freq: Every day | ORAL | 5 refills | Status: DC

## 2023-07-01 NOTE — Progress Notes (Signed)
GI Office Note    Referring Provider: Elfredia Nevins, MD Primary Care Physician:  Elfredia Nevins, MD  Primary Gastroenterologist: Roetta Sessions, MD   Chief Complaint   Chief Complaint  Patient presents with   Follow-up    Patient here today due to needing an ov prior to rescheduling her tcs and egd. Patient says she feels she needs an EKG prior to having the procedures as she has a cardiology appt this Friday as she has chest pain frequently and takes a 81 mg Asprin daily.       History of Present Illness   Sharon Gay is a 57 y.o. female presenting today for follow-up.  Last seen June 2024 for GERD/dysphagia/upper abdominal pain, hepatomegaly with minimal steatosis on previous liver biopsy (2017), colon cancer screening.   Patient has history of numerous cancellations or no-shows for her procedures back in 2017.  Scheduled her for an EGD and colonoscopy after her last office visit, she rescheduled until July. She cancelled her 06/2023 date after prep not working like it should. She has a history of hepatomegaly noted on CT.  Ultrasound with elastography with Metavir F2/F3 score previously in 2017.  Previous positive H. pylori serology status posttreatment.  IR biopsy of liver completed 2017, overall with minimal steatosis, overall normal.  Hepatitis C antibody negative.    Today:  Wants to reschedule EGD/colonoscopy but currently has pending appt with cardiology for chest pain. Yesterday having intermittent chest pain, sometimes feels like she has been sucker punched in the throat and then it radiates down into chest, can be excruciating. Other times she has pain in left lower chest. No diaphoresis.   Continues to have upper abdominal pain/burning. Mobic every other day. Heartburn better controlled Dexilant. Sometimes has solid food dysphagia avoids bread and [place] fries. Sometimes when drinking liquids, it comes out her nose. Has problems swallowing small pills, they get  caught in her throat.   Has more issues with constipation lately.  When she started her bowel prep, she had very little movement with the bisacodyl. She tool it for three days before her procedure. Started Trilyte 1pm the day before her procedure, drank 3/4 of the gallon and stopped. She did not have any good BM with the prep. Called and cancelled her procedure. No melena, brbpr.    Appointment with Dr. Wyline Mood on July 04, 2023.  Abdominal ultrasound June 2024: Normal study  EGD 2003: Noncritical Schatzki ring status post dilation.  Small hiatal hernia.   TCS 09/2004: Normal rectum, 3 diminutive polyps removed unknown path), normal terminal ileum.   EGD 2009: Normal esophagus, very small hiatal hernia, status post dilation.  Medications   Current Outpatient Medications  Medication Sig Dispense Refill   albuterol (PROVENTIL HFA;VENTOLIN HFA) 108 (90 BASE) MCG/ACT inhaler Inhale 1-2 puffs into the lungs every 6 (six) hours as needed for wheezing or shortness of breath. 1 Inhaler 0   Albuterol-Budesonide (AIRSUPRA) 90-80 MCG/ACT AERO Inhale 2 puffs into the lungs 2 (two) times daily.     ALPRAZolam (XANAX) 1 MG tablet Take 1 mg by mouth 3 (three) times daily as needed for anxiety.     Aromatic Inhalants (VICKS VAPOR INHALER IN) Inhale 1 puff into the lungs 4 (four) times daily as needed (congestion).     aspirin EC 81 MG tablet Take 81 mg by mouth See admin instructions. Take 81 mg daily, may also take 81 mg twice daily as needed for chest pain  Aspirin-Caffeine (BC FAST PAIN RELIEF PO) Take 1 packet by mouth daily as needed (pain).     atorvastatin (LIPITOR) 20 MG tablet Take 20 mg by mouth daily.     budesonide-formoterol (SYMBICORT) 80-4.5 MCG/ACT inhaler Inhale 2 puffs into the lungs 2 (two) times daily. 6.9 g 5   Cholecalciferol (VITAMIN D3 MAXIMUM STRENGTH) 125 MCG (5000 UT) capsule Take 5,000 Units by mouth daily.     Coenzyme Q10 (COQ10) 100 MG CAPS Take 100 mg by mouth daily.      dexlansoprazole (DEXILANT) 60 MG capsule Take 1 capsule (60 mg total) by mouth daily before breakfast. 90 capsule 3   Erenumab-aooe (AIMOVIG) 70 MG/ML SOAJ Inject 70 mg into the skin every 30 (thirty) days. 3 mL 4   fluticasone (FLONASE) 50 MCG/ACT nasal spray Place 2 sprays into both nostrils daily as needed for allergies.     gabapentin (NEURONTIN) 100 MG capsule Take 100 mg by mouth 3 (three) times daily as needed (pain).     lidocaine (XYLOCAINE) 5 % ointment Apply 1 application  topically daily as needed for moderate pain.     meloxicam (MOBIC) 7.5 MG tablet Take 7.5 mg by mouth 2 (two) times daily as needed for pain.     nicotine (NICODERM CQ - DOSED IN MG/24 HOURS) 21 mg/24hr patch Place 21 mg onto the skin daily.     Omega-3 Fatty Acids (FISH OIL PO) Take 1 capsule by mouth daily.     oxycodone (ROXICODONE) 30 MG immediate release tablet Take 30 mg by mouth 4 (four) times daily as needed for pain.     PARoxetine (PAXIL) 20 MG tablet Take 20 mg by mouth daily.     Polyethyl Glycol-Propyl Glycol (LUBRICATING EYE DROPS OP) Place 1 drop into both eyes daily as needed (dry eyes).     promethazine (PHENERGAN) 25 MG tablet Take 25 mg by mouth every 6 (six) hours as needed for nausea or vomiting.     rizatriptan (MAXALT) 10 MG tablet As needed for headache; may repeat in 2 hours if needed; max 2 per day or 8 per month 8 tablet 6   No current facility-administered medications for this visit.    Allergies   Allergies as of 07/01/2023 - Review Complete 07/01/2023  Allergen Reaction Noted   Penicillins Anaphylaxis, Nausea And Vomiting, and Swelling 08/10/2012   Morphine Other (See Comments)    Rifampin Nausea Only and Other (See Comments) 02/06/2010   Topamax [topiramate] Other (See Comments) 10/26/2015   Cymbalta [duloxetine hcl] Other (See Comments) 10/26/2015   Tequin [gatifloxacin] Nausea And Vomiting 08/10/2012     Past Medical History   Past Medical History:  Diagnosis Date   AC  (acromioclavicular) joint bone spurs    Anxiety    Arthritis    Back pain    Chest pain 2003   11/2007: Admission with unstable angina; normal coronary angiography  with [facility]; nl EF   Chronic pain    Colitis 2005   COPD (chronic obstructive pulmonary disease) (HCC)    11/2003: Normal PFTs ex minimal increased volumes and decreased the DLCO; Follow[facility] [place] [facility]    Depression    Gastroesophageal reflux disease 2009   Hiatal hernia; esophageal dilatation for Schatzki's ring in 04/2008; chronic constipation; irritable bowel syndrome   History of pneumonia    Hyperlipidemia 2009   Lipid profile 07/2012:227, 91, 56, 153   Lung disease 2010   Migraines    Mitral  valve prolapse    Mitral valve prolapse    MS (multiple sclerosis) (HCC)    MS (multiple sclerosis) (HCC) 2001   Mycobacterium avium complex (HCC) 2010   01/2009: + 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 Jena Gauss: 3 diminutive polyps in descending colon, unknown path    ESOPHAGOGASTRODUODENOSCOPY  2009   Dr. Jena Gauss: normal esophagus, small hiatal hernia, s/p empiric dilatilon    ESOPHAGOGASTRODUODENOSCOPY  2003   Schatzki's ring s/p dilation   FIBEROPTIC BRONCHOSCOPY  2010   LIVER BIOPSY     ORIF RADIAL FRACTURE  2012   Left   VOCAL CORD POLYP REMOVED  2003    Past Family History   Family History  Problem Relation Age of Onset   Emphysema Maternal Grandfather        was a smoker   Arthritis Sister    Mental retardation Sister    Heart disease Sister        Also brother; cardiomyopathy   Thyroid disease Sister    Fibromyalgia Sister    Arthritis Sister    Migraines Sister    COPD Sister    Heart attack Father 88   Arthritis Sister    Migraines Sister     Other Sister        brain tumor   Arthritis Brother    Arthritis Brother    Colon cancer Neg Hx     Past Social History   Social History   Socioeconomic History   Marital status: Married    Spouse name: Not on file   Number of children: 2   Years of education: Not on file   Highest education level: Not on file  Occupational History   Occupation: Automotive  Tobacco Use   Smoking status: Every Day    Current packs/day: 0.25    Average packs/day: 0.3 packs/day for 30.0 years (7.5 ttl pk-yrs)    Types: Cigarettes   Smokeless tobacco: Never   Tobacco comments:    smokes 4-5 cig daily  Vaping Use   Vaping status: Never Used  Substance and Sexual Activity   Alcohol use: No   Drug use: No   Sexual activity: Yes    Birth control/protection: Post-menopausal  Other Topics Concern   Not on file  Social History Narrative   2 stepchildren   Social Determinants of Health   Financial Resource Strain: Medium Risk (06/22/2020)   Overall Financial Resource Strain (CARDIA)    Difficulty of Paying Living Expenses: Somewhat hard  Food Insecurity: No Food Insecurity (06/22/2020)   Hunger Vital Sign    Worried About Running Out of Food in the Last Year: Never true    Ran Out of Food in the Last Year: Never true  Transportation Needs: No Transportation Needs (06/22/2020)   PRAPARE - Administrator, Civil Service (Medical): No    Lack of Transportation (Non-Medical): No  Physical Activity: Sufficiently Active (06/22/2020)   Exercise Vital Sign    Days of Exercise per Week: 5 days    Minutes of Exercise per Session: 30 min  Stress: Stress Concern Present (06/22/2020)   [employer] of Occupational Health - Occupational Stress Questionnaire    Feeling of Stress : Very much  Social Connections: Unknown (04/24/2023)   Received  from [facility]   Social Network    Social Network: Not on file  Intimate Partner Violence: Unknown (04/24/2023)   Received from [facility]   HITS    Physically Hurt: Not on file    Insult or Talk Down To: Not on file    Threaten Physical Harm: Not on file    Scream or Curse: Not on file    Review of Systems   General: Negative for anorexia, weight loss, fever, chills, fatigue, weakness. ENT: Negative for hoarseness, difficulty swallowing , nasal congestion. CV: Negative for   angina, palpitations, dyspnea on exertion, peripheral edema. See hpi Respiratory: Negative for dyspnea at rest, dyspnea on exertion, cough, sputum, wheezing.  GI: See history of present illness. GU:  Negative for dysuria, hematuria, urinary incontinence, urinary frequency, nocturnal urination.  Endo: Negative for unusual weight change.     Physical Exam   BP 122/83 (BP Location: Right Arm, Patient Position: Sitting, Cuff Size: Normal)   Pulse 71   Temp (!) 97.5 F (36.4 C) (Temporal)   Ht 5\' 9"  (1.753 m)   Wt 149 lb 3.2 oz (67.7 kg)   BMI 22.03 kg/m    General: Well-nourished, well-developed in no acute distress.  Eyes: No icterus. Mouth: Oropharyngeal mucosa moist and pink , no lesions erythema or exudate. Lungs: Clear to auscultation bilaterally.  Heart: Regular rate and rhythm, no murmurs rubs or gallops.  Abdomen: Bowel sounds are normal,   nondistended, no hepatosplenomegaly or masses,  no abdominal bruits or hernia , no rebound or guarding. Mild epigastric tenderness Rectal: not performed  Extremities: No lower extremity edema. No clubbing or deformities. Neuro: Alert and oriented x 4   Skin: Warm and dry, no jaundice.   Psych: Alert and cooperative, normal mood and affect.  Labs    Labs from April 2024: White blood cell count 6400, hemoglobin 15.6, platelets 320,000, glucose 77, creatinine 0.91, albumin 4.6, total bilirubin 0.2, alkaline phosphatase 83, AST 20, ALT 12, B12 600, folate 3.3, TSH 1.75, vitamin D 36.9, sed rate 2.  Imaging Studies   No results found.  Assessment/Plan:    GERD/dysphagia -may have  esophageal stricture/reflux esophagitis -continue Dexilant 60mg  daily -EGD/ED once cardiology evaluation complete  Upper abdominal pain -continue PPI  -EGD when able to rule out gastritis/ulcers in setting of NSAID/ASA use  Constipation -trial of Linzess daily -call with progress report in 2 weeks -she will need extended two day bowel prep, different prep for colonoscopy  Chest pain -cardiology evaluation pending -will follow -plan for EGD/colonoscopy once able  H/o colon polyps/colonoscopy -path unknown -overdue for colonoscopy, await cardiology evaluation -will need extended two day bowel prep, different than previous prep      Leanna Battles. Melvyn Neth, MHS, PA-C [facility]

## 2023-07-01 NOTE — Patient Instructions (Addendum)
Continue dexilant 60mg  daily before breakfast for acid reflux Trial of linzess daily before breakfast for constipation.  Let us know if a couple of weeks how your are doing with constipation. We will also be following cardiology evaluation. Hopefully we will be able to move forward with colonoscopy and upper endoscopy in the near future!

## 2023-07-04 ENCOUNTER — Ambulatory Visit: Payer: Medicaid Other | Admitting: Cardiology

## 2023-07-04 NOTE — Progress Notes (Deleted)
Clinical Summary Ms. Sweetland is a 57 y.o.female  Previously seen by Dr Allyson Sabal and Dr Dietrich Pates several years ago.   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 Palpitations    3. Chronic pain     Past Medical History:  Diagnosis Date   AC (acromioclavicular) joint bone spurs    Anxiety    Arthritis    Back pain    Chest pain 2003   11/2007: Admission with unstable angina; normal coronary angiography  with [facility]; nl EF   Chronic pain    Colitis 2005   COPD (chronic obstructive pulmonary disease) (HCC)    11/2003: Normal PFTs ex minimal increased volumes and decreased the DLCO; Followed in Pulmonary clinic/ [facility]    Depression    Gastroesophageal reflux disease 2009   Hiatal hernia; esophageal dilatation for Schatzki's ring in 04/2008; chronic constipation; irritable bowel syndrome   History of pneumonia    Hyperlipidemia 2009   Lipid profile 07/2012:227, 91, 56, 153   Lung disease 2010   Migraines    Mitral valve prolapse    Mitral valve prolapse    MS (multiple sclerosis) (HCC)    MS (multiple sclerosis) (HCC) 2001   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   Cymbalta [Duloxetine Hcl] Other (See Comments)    Dizziness    Tequin  [Gatifloxacin] Nausea And Vomiting     Current Outpatient Medications  Medication Sig Dispense Refill   albuterol (PROVENTIL HFA;VENTOLIN HFA) 108 (90 BASE) MCG/ACT inhaler Inhale 1-2 puffs into the lungs every 6 (six) hours as needed for wheezing or shortness of breath. 1 Inhaler 0   Albuterol-Budesonide (AIRSUPRA) 90-80 MCG/ACT AERO Inhale 2 puffs into the lungs 2 (two) times daily.     ALPRAZolam (XANAX) 1 MG tablet Take 1 mg by mouth 3 (three) times daily as needed for anxiety.     Aromatic Inhalants (VICKS VAPOR INHALER IN) Inhale 1 puff into the lungs 4 (four) times daily as needed (congestion).     aspirin EC 81 MG tablet Take 81 mg by mouth See admin instructions. Take 81 mg daily, may also take 81 mg twice daily as needed for chest pain     Aspirin-Caffeine (BC FAST PAIN RELIEF PO) Take 1 packet by mouth daily as needed (pain).     atorvastatin (LIPITOR) 20 MG tablet Take 20 mg by mouth daily.     budesonide-formoterol (SYMBICORT) 80-4.5 MCG/ACT inhaler Inhale 2 puffs into the lungs 2 (two) times daily. 6.9 g 5   Cholecalciferol (VITAMIN D3 MAXIMUM STRENGTH) 125 MCG (5000 UT) capsule Take 5,000 Units by mouth daily.     Coenzyme Q10 (COQ10) 100 MG CAPS Take 100 mg by mouth daily.  dexlansoprazole (DEXILANT) 60 MG capsule Take 1 capsule (60 mg total) by mouth daily before breakfast. 90 capsule 3   Erenumab-aooe (AIMOVIG) 70 MG/ML SOAJ Inject 70 mg into the skin every 30 (thirty) days. 3 mL 4   fluticasone (FLONASE) 50 MCG/ACT nasal spray Place 2 sprays into both nostrils daily as needed for allergies.     gabapentin (NEURONTIN) 100 MG capsule Take 100 mg by mouth 3 (three) times daily as needed (pain).     lidocaine (XYLOCAINE) 5 % ointment Apply 1 application  topically daily as needed for moderate pain.     linaclotide (LINZESS) 290 MCG CAPS capsule Take 1 capsule (290 mcg total) by mouth daily before breakfast. 30 capsule 5   meloxicam (MOBIC) 7.5 MG tablet Take 7.5 mg by mouth 2  (two) times daily as needed for pain.     nicotine (NICODERM CQ - DOSED IN MG/24 HOURS) 21 mg/24hr patch Place 21 mg onto the skin daily.     Omega-3 Fatty Acids (FISH OIL PO) Take 1 capsule by mouth daily.     oxycodone (ROXICODONE) 30 MG immediate release tablet Take 30 mg by mouth 4 (four) times daily as needed for pain.     PARoxetine (PAXIL) 20 MG tablet Take 20 mg by mouth daily.     Polyethyl Glycol-Propyl Glycol (LUBRICATING EYE DROPS OP) Place 1 drop into both eyes daily as needed (dry eyes).     promethazine (PHENERGAN) 25 MG tablet Take 25 mg by mouth every 6 (six) hours as needed for nausea or vomiting.     rizatriptan (MAXALT) 10 MG tablet As needed for headache; may repeat in 2 hours if needed; max 2 per day or 8 per month 8 tablet 6   No current facility-administered medications for this visit.     Past Surgical History:  Procedure Laterality Date   COLONOSCOPY W/ POLYPECTOMY  2006   Dr. Jena Gauss: 3 diminutive polyps in descending colon, unknown path    ESOPHAGOGASTRODUODENOSCOPY  2009   Dr. Jena Gauss: normal esophagus, small hiatal hernia, s/p empiric dilatilon    ESOPHAGOGASTRODUODENOSCOPY  2003   Schatzki's ring s/p dilation   FIBEROPTIC BRONCHOSCOPY  2010   LIVER BIOPSY     ORIF RADIAL FRACTURE  2012   Left   VOCAL CORD POLYP REMOVED  2003     Allergies  Allergen Reactions   Penicillins Anaphylaxis, Nausea And Vomiting and Swelling        Morphine Other (See Comments)    Hallucinations   Rifampin Nausea Only and Other (See Comments)    Blisters around lips and tongue Discolored orange urine and yellowed skin expected with Rifampin, not adverse reaction   Topamax [Topiramate] Other (See Comments)    Headache and confusion.   Cymbalta [Duloxetine Hcl] Other (See Comments)    Dizziness    Tequin [Gatifloxacin] Nausea And Vomiting      Family History  Problem Relation Age of Onset   Emphysema Maternal Grandfather        was a smoker   Arthritis Sister     Mental retardation Sister    Heart disease Sister        Also brother; cardiomyopathy   Thyroid disease Sister    Fibromyalgia Sister    Arthritis Sister    Migraines Sister    COPD Sister    Heart attack Father 58   Arthritis Sister    Migraines Sister    Other Sister        brain  tumor   Arthritis Brother    Arthritis Brother    Colon cancer Neg Hx      Social History Ms. Pullum reports that she has been smoking cigarettes. She has a 7.5 pack-year smoking history. She has never used smokeless tobacco. Ms. Lafrance reports no history of alcohol use.   Review of Systems CONSTITUTIONAL: No weight loss, fever, chills, weakness or fatigue.  HEENT: Eyes: No visual loss, blurred vision, double vision or yellow sclerae.No hearing loss, sneezing, congestion, runny nose or sore throat.  SKIN: No rash or itching.  CARDIOVASCULAR:  RESPIRATORY: No shortness of breath, cough or sputum.  GASTROINTESTINAL: No anorexia, nausea, vomiting or diarrhea. No abdominal pain or blood.  GENITOURINARY: No burning on urination, no polyuria NEUROLOGICAL: No headache, dizziness, syncope, paralysis, ataxia, numbness or tingling in the extremities. No change in bowel or bladder control.  MUSCULOSKELETAL: No muscle, back pain, joint pain or stiffness.  LYMPHATICS: No enlarged nodes. No history of splenectomy.  PSYCHIATRIC: No history of depression or anxiety.  ENDOCRINOLOGIC: No reports of sweating, cold or heat intolerance. No polyuria or polydipsia.  Marland Kitchen   Physical Examination There were no vitals filed for this visit. There were no vitals filed for this visit.  Gen: resting comfortably, no acute distress HEENT: no scleral icterus, pupils equal round and reactive, no palptable cervical adenopathy,  CV Resp: Clear to auscultation bilaterally GI: abdomen is soft, non-tender, non-distended, normal bowel sounds, no hepatosplenomegaly MSK: extremities are warm, no edema.  Skin: warm, no rash Neuro:   no focal deficits Psych: appropriate affect   Diagnostic Studies  03/2018 echo Study Conclusions   - Left ventricle: The cavity size was normal. Systolic function was    normal. The estimated ejection fraction was in the range of 55%    to 60%. Wall motion was normal; there were no regional wall    motion abnormalities. Left ventricular diastolic function    parameters were normal.   03/2023 carotid US  IMPRESSION: Color duplex indicates minimal heterogeneous plaque, with no hemodynamically significant stenosis by duplex criteria in the extracranial cerebrovascular circulation.  Assessment and Plan        Antoine Poche, M.D., F.A.C.C.

## 2023-07-09 ENCOUNTER — Other Ambulatory Visit: Payer: Medicaid Other | Admitting: Adult Health

## 2023-08-13 ENCOUNTER — Ambulatory Visit (HOSPITAL_COMMUNITY): Payer: Medicaid Other | Admitting: Psychiatry

## 2023-08-25 ENCOUNTER — Other Ambulatory Visit: Payer: Medicaid Other | Admitting: Adult Health

## 2023-09-10 DIAGNOSIS — M329 Systemic lupus erythematosus, unspecified: Secondary | ICD-10-CM

## 2023-09-10 HISTORY — DX: Systemic lupus erythematosus, unspecified: M32.9

## 2023-09-17 ENCOUNTER — Other Ambulatory Visit (HOSPITAL_COMMUNITY): Payer: Self-pay | Admitting: Internal Medicine

## 2023-09-17 DIAGNOSIS — M47816 Spondylosis without myelopathy or radiculopathy, lumbar region: Secondary | ICD-10-CM

## 2023-10-04 ENCOUNTER — Emergency Department (HOSPITAL_COMMUNITY)
Admission: EM | Admit: 2023-10-04 | Discharge: 2023-10-04 | Disposition: A | Discharge status: Home / Self Care | Payer: Medicaid Other

## 2023-10-04 ENCOUNTER — Emergency Department (HOSPITAL_COMMUNITY): Payer: Medicaid Other

## 2023-10-04 ENCOUNTER — Other Ambulatory Visit: Payer: Self-pay

## 2023-10-04 DIAGNOSIS — R079 Chest pain, unspecified: Secondary | ICD-10-CM | POA: Diagnosis not present

## 2023-10-04 DIAGNOSIS — Z7982 Long term (current) use of aspirin: Secondary | ICD-10-CM | POA: Insufficient documentation

## 2023-10-04 DIAGNOSIS — R531 Weakness: Secondary | ICD-10-CM | POA: Insufficient documentation

## 2023-10-04 DIAGNOSIS — J449 Chronic obstructive pulmonary disease, unspecified: Secondary | ICD-10-CM | POA: Insufficient documentation

## 2023-10-04 LAB — CBC WITH DIFFERENTIAL/PLATELET
Abs Immature Granulocytes: 0.02 10*3/uL (ref 0.00–0.07)
Basophils Absolute: 0.1 10*3/uL (ref 0.0–0.1)
Basophils Relative: 1 %
Eosinophils Absolute: 0.2 10*3/uL (ref 0.0–0.5)
Eosinophils Relative: 3 %
HCT: 41.6 % (ref 36.0–46.0)
Hemoglobin: 13.6 g/dL (ref 12.0–15.0)
Immature Granulocytes: 0 %
Lymphocytes Relative: 31 %
Lymphs Abs: 2.6 10*3/uL (ref 0.7–4.0)
MCH: 30.6 pg (ref 26.0–34.0)
MCHC: 32.7 g/dL (ref 30.0–36.0)
MCV: 93.5 fL (ref 80.0–100.0)
Monocytes Absolute: 0.5 10*3/uL (ref 0.1–1.0)
Monocytes Relative: 6 %
Neutro Abs: 5 10*3/uL (ref 1.7–7.7)
Neutrophils Relative %: 59 %
Platelets: 252 10*3/uL (ref 150–400)
RBC: 4.45 MIL/uL (ref 3.87–5.11)
RDW: 12.7 % (ref 11.5–15.5)
WBC: 8.4 10*3/uL (ref 4.0–10.5)
nRBC: 0 % (ref 0.0–0.2)

## 2023-10-04 LAB — COMPREHENSIVE METABOLIC PANEL
ALT: 15 U/L (ref 0–44)
AST: 17 U/L (ref 15–41)
Albumin: 3.8 g/dL (ref 3.5–5.0)
Alkaline Phosphatase: 53 U/L (ref 38–126)
Anion gap: 9 (ref 5–15)
BUN: 11 mg/dL (ref 6–20)
CO2: 26 mmol/L (ref 22–32)
Calcium: 9.4 mg/dL (ref 8.9–10.3)
Chloride: 105 mmol/L (ref 98–111)
Creatinine, Ser: 0.72 mg/dL (ref 0.44–1.00)
GFR, Estimated: >60 mL/min (ref >60)
Glucose, Bld: 84 mg/dL (ref 70–99)
Potassium: 3.6 mmol/L (ref 3.5–5.1)
Sodium: 140 mmol/L (ref 135–145)
Total Bilirubin: 0.5 mg/dL (ref 0.0–1.2)
Total Protein: 6.7 g/dL (ref 6.5–8.1)

## 2023-10-04 LAB — MAGNESIUM: Magnesium: 1.9 mg/dL (ref 1.7–2.4)

## 2023-10-04 LAB — TROPONIN I (HIGH SENSITIVITY)
Troponin I (High Sensitivity): 4 ng/L (ref <18)
Troponin I (High Sensitivity): 5 ng/L (ref <18)

## 2023-10-04 LAB — TSH: TSH: 1.552 u[IU]/mL (ref 0.350–4.500)

## 2023-10-04 MED ORDER — METHOCARBAMOL 500 MG PO TABS
1000.0000 mg | ORAL_TABLET | Freq: Once | ORAL | Status: AC
  Administered 2023-10-04: 1000 mg via ORAL
  Filled 2023-10-04: qty 2

## 2023-10-04 MED ORDER — ALBUTEROL SULFATE HFA 108 (90 BASE) MCG/ACT IN AERS: 2.0000 | INHALATION_SPRAY | RESPIRATORY_TRACT | Status: DC | PRN

## 2023-10-04 MED ORDER — KETOROLAC TROMETHAMINE 60 MG/2ML IM SOLN: 60.0000 mg | Freq: Once | INTRAMUSCULAR | Status: DC

## 2023-10-04 MED ORDER — KETOROLAC TROMETHAMINE 30 MG/ML IJ SOLN
30.0000 mg | Freq: Once | INTRAMUSCULAR | Status: AC
  Administered 2023-10-04: 30 mg via INTRAVENOUS
  Filled 2023-10-04: qty 1

## 2023-10-04 NOTE — ED Triage Notes (Signed)
Pt arrived POV with c/o skin sore to touch, bones hurt, throat pain that radiates down to her chest that feels like she is being punched, weakness, and SOB x 1 month. Pt stated her Dr made appt to get blood work Monday but pt did not want to wait. Pt started Neurontin for her bone pain.

## 2023-10-04 NOTE — Discharge Instructions (Signed)
Your workup today was overall reassuring.  I recommend that you follow-up with your primary care provider for recheck or I have also listed one of the neurology groups in [place] that you may contact to arrange follow-up appointment.

## 2023-10-04 NOTE — ED Provider Notes (Signed)
[place] EMERGENCY DEPARTMENT AT [facility] Provider Note   CSN: [id number] Arrival date & time: 10/04/23  1222     History {Add pertinent medical, surgical, social history, OB history to HPI:1} Chief Complaint  Patient presents with  . Weakness  . Shortness of Breath    Sharon Gay is a 58 y.o. female.   Weakness Associated symptoms: no abdominal pain, no chest pain, no cough, no diarrhea, no dizziness, no dysuria, no fever, no headaches, no nausea, no shortness of breath and no vomiting         Sharon Gay is a 58 y.o. female with past medical history of anxiety, asthmatic bronchitis, COPD, chronic pain, documented MS but patient denies current treatment for MS and states this was a diagnosis from early 2000.  She presents to the Emergency Department complaining of generalized weakness, lethargy and fatigue.  She describes intermittent sharp pains to her throat that radiate into her substernal chest area.  Symptoms also associated with "bone pain" she states her bone pain is mainly affecting the bones of her lower legs and arms and "my skin hurts" She does not describe pain of her joints.  She has been evaluated for this by her PCP who started her on Neurontin but she states she does not take the medication as she is afraid of the side effects. She denies any chest pain or throat pain at this time.     Home Medications Prior to Admission medications   Medication Sig Start Date End Date Taking? Authorizing Provider  albuterol (PROVENTIL HFA;VENTOLIN HFA) 108 (90 BASE) MCG/ACT inhaler Inhale 1-2 puffs into the lungs every 6 (six) hours as needed for wheezing or shortness of breath. Patient taking differently: Inhale 2 puffs into the lungs every 6 (six) hours as needed for wheezing or shortness of breath. 08/25/15  Yes Ivery Quale, PA-C  Alpha-Lipoic Acid 100 MG CAPS Take 2 capsules by mouth 3 (three) times daily. 08/20/23  Yes [provider]   ALPRAZolam Prudy Feeler) 1 MG tablet Take 1 mg by mouth 3 (three) times daily as needed for anxiety.   Yes [provider]  amoxicillin-clavulanate (AUGMENTIN) 875-125 MG tablet Take 1 tablet by mouth 2 (two) times daily. 09/15/23  Yes [provider]  Aromatic Inhalants (VICKS VAPOR INHALER IN) Inhale 1 puff into the lungs 4 (four) times daily as needed (congestion).   Yes [provider]  aspirin EC 81 MG tablet Take 81 mg by mouth See admin instructions. Take 81 mg daily, may also take 81 mg twice daily as needed for chest pain   Yes [provider]  atorvastatin (LIPITOR) 20 MG tablet Take 20 mg by mouth daily.   Yes [provider]  Cholecalciferol (VITAMIN D3 MAXIMUM STRENGTH) 125 MCG (5000 UT) capsule Take 5,000 Units by mouth daily.   Yes [provider]  Coenzyme Q10 (COQ10) 100 MG CAPS Take 100 mg by mouth daily.   Yes [provider]  dexlansoprazole (DEXILANT) 60 MG capsule Take 1 capsule (60 mg total) by mouth daily before breakfast. 02/11/23  Yes Tiffany Kocher, PA-C  fluticasone (FLONASE) 50 MCG/ACT nasal spray Place 2 sprays into both nostrils daily as needed for allergies.   Yes [provider]  gabapentin (NEURONTIN) 100 MG capsule Take 100 mg by mouth 3 (three) times daily as needed (pain). 03/11/23  Yes [provider]  lidocaine (XYLOCAINE) 5 % ointment Apply 1 application  topically daily as needed for moderate  pain. 11/28/15  Yes [provider]  nicotine (NICODERM CQ - DOSED IN MG/24 HOURS) 21 mg/24hr patch Place 21 mg onto the skin daily.   Yes [provider]  Omega-3 Fatty Acids (FISH OIL PO) Take 1 capsule by mouth daily.   Yes [provider]  oxycodone (ROXICODONE) 30 MG immediate release tablet Take 30 mg by mouth 4 (four) times daily as needed for pain.   Yes [provider]  Polyethyl Glycol-Propyl Glycol (LUBRICATING EYE DROPS OP) Place 1 drop into both eyes daily  as needed (dry eyes).   Yes [provider]  promethazine (PHENERGAN) 25 MG tablet Take 25 mg by mouth every 6 (six) hours as needed for nausea or vomiting.   Yes [provider]  rizatriptan (MAXALT) 10 MG tablet As needed for headache; may repeat in 2 hours if needed; max 2 per day or 8 per month 10/07/22  Yes Penumalli, Glenford Bayley, MD      Allergies    Penicillins, Morphine, Rifampin, Topamax [topiramate], Cymbalta [duloxetine hcl], and Tequin [gatifloxacin]    Review of Systems   Review of Systems  Constitutional:  Positive for fatigue. Negative for appetite change and fever.  Respiratory:  Negative for cough and shortness of breath.   Cardiovascular:  Negative for chest pain.  Gastrointestinal:  Negative for abdominal pain, diarrhea, nausea and vomiting.  Genitourinary:  Negative for dysuria.  Musculoskeletal:        "Bone pain" both arms and legs  Skin:  Negative for color change and rash.  Neurological:  Positive for weakness. Negative for dizziness, numbness and headaches.    Physical Exam Updated Vital Signs BP (!) 145/89   Pulse 83   Temp 98.5 F (36.9 C) (Oral)   Resp 10   Ht 5\' 9"  (1.753 m)   Wt 65.8 kg   SpO2 100%   BMI 21.41 kg/m  Physical Exam Vitals and nursing note reviewed.  Constitutional:      General: She is not in acute distress.    Appearance: She is well-developed. She is not toxic-appearing.  HENT:     Mouth/Throat:     Mouth: Mucous membranes are moist.  Cardiovascular:     Rate and Rhythm: Normal rate and regular rhythm.     Pulses: Normal pulses.  Pulmonary:     Effort: Pulmonary effort is normal.  Chest:     Chest wall: No tenderness.  Abdominal:     Palpations: Abdomen is soft.     Tenderness: There is no abdominal tenderness.  Musculoskeletal:        General: Normal range of motion.     Right lower leg: No edema.     Left lower leg: No edema.  Skin:    General: Skin is warm.     Capillary Refill: Capillary refill  takes less than 2 seconds.  Neurological:     General: No focal deficit present.     Mental Status: She is alert.     Sensory: Sensation is intact. No sensory deficit.     Motor: Motor function is intact. No weakness.     Coordination: Coordination is intact.     Comments: CN III through XII grossly intact.  Speech clear.  Moving all extremities without difficulty.  Mentating well.    ED Results / Procedures / Treatments   Labs (all labs ordered are listed, but only abnormal results are displayed) Labs Reviewed  CBC WITH DIFFERENTIAL/PLATELET  COMPREHENSIVE METABOLIC PANEL  TSH  MAGNESIUM  TROPONIN I (HIGH SENSITIVITY)  TROPONIN I (HIGH SENSITIVITY)    EKG None  Radiology DG Chest 2 View Result Date: 10/04/2023 CLINICAL DATA:  Shortness of breath. EXAM: CHEST - 2 VIEW COMPARISON:  01/20/2023. FINDINGS: Bilateral lung fields are clear. Bilateral costophrenic angles are clear. Normal cardio-mediastinal silhouette. No acute osseous abnormalities. The soft tissues are within normal limits. IMPRESSION: No active cardiopulmonary disease. Electronically Signed   By: Jules Schick M.D.   On: 10/04/2023 14:13    Procedures Procedures  {Document cardiac monitor, telemetry assessment procedure when appropriate:1}  Medications Ordered in ED Medications  albuterol (VENTOLIN HFA) 108 (90 Base) MCG/ACT inhaler 2 puff (has no administration in time range)  methocarbamol (ROBAXIN) tablet 1,000 mg (1,000 mg Oral Given by Other 10/04/23 1436)  ketorolac (TORADOL) 30 MG/ML injection 30 mg (30 mg Intravenous Given by Other 10/04/23 1437)    ED Course/ Medical Decision Making/ A&P   {   Click here for ABCD2, HEART and other calculatorsREFRESH Note before signing :1}                              Medical Decision Making Patient here with generalized weakness and fatigue.  Symptoms for 1 week.  No unilateral weakness.  She also endorses intermittent sharp stabbing pains of her throat that  radiate into her chest.  The symptoms also present intermittently for 1 week.  Vital signs reassuring.   Differential would include but not limited to ACS, esophageal spasms versus GERD, ENT process also considered but felt less likely.  Cause of her generalized weakness unclear.  Possibly viral process, anemia, cardiac cause.  Amount and/or Complexity of Data Reviewed Labs: ordered.    Details: Labs unremarkable.  Delta troponin flat Radiology: ordered.    Details: Chest x-ray shows no acute cardiopulmonary process ECG/medicine tests: ordered.    Details: EKG shows sinus rhythm with left axis deviation Discussion of management or test interpretation with external provider(s): Because of patient's symptoms unclear.  Doubt acute process.  Neurologic exam reassuring.  No facial or unilateral weakness.  No confusion or speech changes, pt does not appear somnolent .  Database was reviewed, Confirmed patient does have prescriptions for alprazolam 3 times daily and oxycodone 4 times daily. This could be possible cause of her lethargy.    Risk Prescription drug management.     {Document critical care time when appropriate:1} {Document review of labs and clinical decision tools ie heart score, Chads2Vasc2 etc:1}  {Document your independent review of radiology images, and any outside records:1} {Document your discussion with family members, caretakers, and with consultants:1} {Document social determinants of health affecting pt's care:1} {Document your decision making why or why not admission, treatments were needed:1} Final Clinical Impression(s) / ED Diagnoses Final diagnoses:  None    Rx / DC Orders ED Discharge Orders     None

## 2023-10-07 ENCOUNTER — Telehealth: Payer: Self-pay | Admitting: *Deleted

## 2023-10-07 NOTE — Telephone Encounter (Signed)
Pt says she is ready to get scheduled for her procedures. She was seen in at [facility] ER on 10/04/23. She says she has an appointment with cardiology on 10/13/23. Also, says EKG was done in the ER and was told everything looked good. She says she was told that they believed she was having throat spasms. Please advise. Thank you

## 2023-10-08 ENCOUNTER — Encounter (HOSPITAL_COMMUNITY): Payer: Self-pay

## 2023-10-08 ENCOUNTER — Ambulatory Visit (HOSPITAL_COMMUNITY): Payer: Medicaid Other | Attending: Internal Medicine

## 2023-10-13 ENCOUNTER — Ambulatory Visit: Payer: Medicaid Other | Attending: Cardiology | Admitting: Cardiology

## 2023-10-13 ENCOUNTER — Encounter: Payer: Self-pay | Admitting: Cardiology

## 2023-10-13 VITALS — BP 116/84 | HR 92 | Ht 68.0 in | Wt 144.8 lb

## 2023-10-13 DIAGNOSIS — R079 Chest pain, unspecified: Secondary | ICD-10-CM

## 2023-10-13 DIAGNOSIS — R0789 Other chest pain: Secondary | ICD-10-CM

## 2023-10-13 DIAGNOSIS — R0609 Other forms of dyspnea: Secondary | ICD-10-CM

## 2023-10-13 NOTE — Patient Instructions (Signed)
Medication Instructions:  Your physician recommends that you continue on your current medications as directed. Please refer to the Current Medication list given to you today.  *If you need a refill on your cardiac medications before your next appointment, please call your pharmacy*   Lab Work: None If you have labs (blood work) drawn today and your tests are completely normal, you will receive your results only by: MyChart Message (if you have MyChart) OR A paper copy in the mail If you have any lab test that is abnormal or we need to change your treatment, we will call you to review the results.   Testing/Procedures: Your physician has requested that you have an echocardiogram. Echocardiography is a painless test that uses sound waves to create images of your heart. It provides your doctor with information about the size and shape of your heart and how well your heart's chambers and valves are working. This procedure takes approximately one hour. There are no restrictions for this procedure. Please do NOT wear cologne, perfume, aftershave, or lotions (deodorant is allowed). Please arrive 15 minutes prior to your appointment time.  Please note: We ask at that you not bring children with you during ultrasound (echo/ vascular) testing. Due to room size and safety concerns, children are not allowed in the ultrasound rooms during exams. Our front office staff cannot provide observation of children in our lobby area while testing is being conducted. An adult accompanying a patient to their appointment will only be allowed in the ultrasound room at the discretion of the ultrasound technician under special circumstances. We apologize for any inconvenience.    Follow-Up: At [facility], you and your health needs are our priority.  As part of our continuing mission to provide you with exceptional heart care, we have created designated Provider Care Teams.  These Care Teams include your  primary Cardiologist (physician) and Advanced Practice Providers (APPs -  Physician Assistants and Nurse Practitioners) who all work together to provide you with the care you need, when you need it.  We recommend signing up for the patient portal called "MyChart".  Sign up information is provided on this After Visit Summary.  MyChart is used to connect with patients for Virtual Visits (Telemedicine).  Patients are able to view lab/test results, encounter notes, upcoming appointments, etc.  Non-urgent messages can be sent to your provider as well.   To learn more about what you can do with MyChart, go to [email or web address].    Your next appointment:    Follow up to be determined pending testing results   Provider:   You may see Dina Rich, MD or one of the following Advanced Practice Providers on your designated Care Team:   Randall An, PA-C  Jacolyn Reedy, [place]     Other Instructions

## 2023-10-13 NOTE — Telephone Encounter (Signed)
She will need an updated ov, last seen 06/2023 and per ov note, we were waiting to see how her cardiology appt on 07/04/23 went but she canceled it and has not been seen by cardiology.    She has cardiology appt today.  Needs updated ov with [place] before scheduling any procedures.

## 2023-10-13 NOTE — Progress Notes (Signed)
Clinical Summary Sharon Gay is a 58 y.o.female  1.Chest pain - long history, prior evaluatio n2014 by Dr Dietrich Pates as well as around 2010 - notes mention remote cath, stress testing that was benign.   09/2023 generalized weakness, fatigue, lethargy Throat pain radiates into her substernal chst  - trops neg x2, EKG no specific st/t chagnes - CXR no acute process  - gets a pain in throat down into midchest. 10/10 in severity. Can occur at rest or with activity. Can get diaphroetic, feel shaky. Lasts a few minutes, longest up to 10 minutes. Not positional. 3 episodes over the last 6 weeks - recent SOB for several months. Can occur at rest or with activity - no cough, no wheezing - no recent edema  CAD risk factors: HLD, smoker x 40 years, father died age 74 from MI, bother stent in his heart age 37       Past Medical History:  Diagnosis Date   AC (acromioclavicular) joint bone spurs    Anxiety    Arthritis    Back pain    Chest pain 2003   11/2007: Admission with unstable angina; normal coronary angiography  with [facility]; nl EF   Chronic pain    Colitis 2005   COPD (chronic obstructive pulmonary disease) (HCC)    11/2003: Normal PFTs ex minimal increased volumes and decreased the DLCO; Followed in Pulmonary clinic/ [facility]/ [place]    Depression    Gastroesophageal reflux disease 2009   Hiatal hernia; esophageal dilatation for Schatzki's ring in 04/2008; chronic constipation; irritable bowel syndrome   History of pneumonia    Hyperlipidemia 2009   Lipid profile 07/2012:227, 91, 56, 153   Lung disease 2010   Migraines    Mitral valve prolapse    Mitral valve prolapse    MS (multiple sclerosis) (HCC)    MS (multiple sclerosis) (HCC) 2001   Mycobacterium avium complex (HCC) 2010   01/2009: + 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   Cymbalta [Duloxetine Hcl] Other (See Comments)    Dizziness    Tequin [Gatifloxacin] Nausea And Vomiting     Current Outpatient Medications  Medication Sig Dispense Refill   albuterol (PROVENTIL HFA;VENTOLIN HFA) 108 (90 BASE) MCG/ACT inhaler Inhale 1-2 puffs into the lungs every 6 (six) hours as needed for wheezing or shortness of breath. (Patient taking differently: Inhale 2 puffs into the lungs every 6 (six) hours as needed for wheezing or shortness of breath.) 1 Inhaler 0   Alpha-Lipoic Acid 100 MG CAPS Take 2 capsules by mouth 3 (three) times daily.     ALPRAZolam (XANAX) 1 MG tablet Take 1 mg by mouth 3 (three) times daily as needed for anxiety.     amoxicillin-clavulanate (AUGMENTIN) 875-125 MG tablet Take 1 tablet by mouth 2 (two) times daily.     Aromatic Inhalants (VICKS VAPOR INHALER IN) Inhale 1 puff into the lungs 4 (four) times daily as needed (congestion).  aspirin EC 81 MG tablet Take 81 mg by mouth See admin instructions. Take 81 mg daily, may also take 81 mg twice daily as needed for chest pain     atorvastatin (LIPITOR) 20 MG tablet Take 20 mg by mouth daily.     Cholecalciferol (VITAMIN D3 MAXIMUM STRENGTH) 125 MCG (5000 UT) capsule Take 5,000 Units by mouth daily.     Coenzyme Q10 (COQ10) 100 MG CAPS Take 100 mg by mouth daily.     dexlansoprazole (DEXILANT) 60 MG capsule Take 1 capsule (60 mg total) by mouth daily before breakfast. 90 capsule 3   fluticasone (FLONASE) 50 MCG/ACT nasal spray Place 2 sprays into both nostrils daily as needed for allergies.     gabapentin (NEURONTIN) 100 MG  capsule Take 100 mg by mouth 3 (three) times daily as needed (pain).     lidocaine (XYLOCAINE) 5 % ointment Apply 1 application  topically daily as needed for moderate pain.     nicotine (NICODERM CQ - DOSED IN MG/24 HOURS) 21 mg/24hr patch Place 21 mg onto the skin daily.     Omega-3 Fatty Acids (FISH OIL PO) Take 1 capsule by mouth daily.     oxycodone (ROXICODONE) 30 MG immediate release tablet Take 30 mg by mouth 4 (four) times daily as needed for pain.     Polyethyl Glycol-Propyl Glycol (LUBRICATING EYE DROPS OP) Place 1 drop into both eyes daily as needed (dry eyes).     promethazine (PHENERGAN) 25 MG tablet Take 25 mg by mouth every 6 (six) hours as needed for nausea or vomiting.     rizatriptan (MAXALT) 10 MG tablet As needed for headache; may repeat in 2 hours if needed; max 2 per day or 8 per month 8 tablet 6   No current facility-administered medications for this visit.     Past Surgical History:  Procedure Laterality Date   COLONOSCOPY W/ POLYPECTOMY  2006   Dr. Jena Gauss: 3 diminutive polyps in descending colon, unknown path    ESOPHAGOGASTRODUODENOSCOPY  2009   Dr. Jena Gauss: normal esophagus, small hiatal hernia, s/p empiric dilatilon    ESOPHAGOGASTRODUODENOSCOPY  2003   Schatzki's ring s/p dilation   FIBEROPTIC BRONCHOSCOPY  2010   LIVER BIOPSY     ORIF RADIAL FRACTURE  2012   Left   VOCAL CORD POLYP REMOVED  2003     Allergies  Allergen Reactions   Penicillins Anaphylaxis, Nausea And Vomiting and Swelling        Morphine Other (See Comments)    Hallucinations   Rifampin Nausea Only and Other (See Comments)    Blisters around lips and tongue Discolored orange urine and yellowed skin expected with Rifampin, not adverse reaction   Topamax [Topiramate] Other (See Comments)    Headache and confusion.   Cymbalta [Duloxetine Hcl] Other (See Comments)    Dizziness    Tequin [Gatifloxacin] Nausea And Vomiting      Family History  Problem Relation Age of Onset    Emphysema Maternal Grandfather        was a smoker   Arthritis Sister    Mental retardation Sister    Heart disease Sister        Also brother; cardiomyopathy   Thyroid disease Sister    Fibromyalgia Sister    Arthritis Sister    Migraines Sister    COPD Sister    Heart attack Father 16   Arthritis Sister    Migraines Sister    Other Sister  brain tumor   Arthritis Brother    Arthritis Brother    Colon cancer Neg Hx      Social History Sharon Gay reports that she has been smoking cigarettes. She has a 7.5 pack-year smoking history. She has never used smokeless tobacco. Sharon Gay reports no history of alcohol use.     Physical Examination Today's Vitals   10/13/23 1504  BP: 116/84  Pulse: 92  SpO2: 97%  Weight: 144 lb 12.8 oz (65.7 kg)  Height: 5\' 8"  (1.727 m)   Body mass index is 22.02 kg/m.  Gen: resting comfortably, no acute distress HEENT: no scleral icterus, pupils equal round and reactive, no palptable cervical adenopathy,  CV: RRR, no m/rg, no jvd Resp: Clear to auscultation bilaterally GI: abdomen is soft, non-tender, non-distended, normal bowel sounds, no hepatosplenomegaly MSK: extremities are warm, no edema.  Skin: warm, no rash Neuro:  no focal deficits Psych: appropriate affect     Assessment and Plan  1.Chest pain/DOE - unclear etiology. Long history of chest pains and chronic pain, remote stress testing and cath were benign years ago.  - multiple CAD risk factors - will plan for echo initally pending results likely coronary CTA.   F/u pending testing results.     Antoine Poche, M.D.,

## 2023-10-13 NOTE — Telephone Encounter (Signed)
Pt informed that she needed an updated OV prior to being scheduled.  Please schedule pt for OV. Thank you

## 2023-10-21 NOTE — Telephone Encounter (Signed)
Received referral from her PCP today for esophagitis. She is aware of her OV with [facility] on 2/18 at 0800

## 2023-10-21 NOTE — Telephone Encounter (Signed)
noted

## 2023-10-23 ENCOUNTER — Telehealth (HOSPITAL_COMMUNITY): Payer: Self-pay

## 2023-10-23 NOTE — Telephone Encounter (Signed)
Called to confirm 10/27/23 appt no answer vm not set up

## 2023-10-27 ENCOUNTER — Ambulatory Visit (HOSPITAL_COMMUNITY): Payer: Medicaid Other | Admitting: Psychiatry

## 2023-10-28 ENCOUNTER — Ambulatory Visit: Payer: Medicaid Other | Admitting: Internal Medicine

## 2023-11-03 ENCOUNTER — Ambulatory Visit (HOSPITAL_COMMUNITY)
Admission: RE | Admit: 2023-11-03 | Discharge: 2023-11-03 | Disposition: A | Discharge status: Home / Self Care | Payer: Medicaid Other | Source: Ambulatory Visit | Attending: Cardiology | Admitting: Cardiology

## 2023-11-03 DIAGNOSIS — R079 Chest pain, unspecified: Secondary | ICD-10-CM | POA: Diagnosis not present

## 2023-11-03 LAB — ECHOCARDIOGRAM COMPLETE
Area-P 1/2: 2.69 cm2
S' Lateral: 2.6 cm

## 2023-11-03 NOTE — Progress Notes (Signed)
*  PRELIMINARY RESULTS* Echocardiogram 2D Echocardiogram has been performed.  Sharon Gay 11/03/2023, 11:40 AM

## 2023-11-07 ENCOUNTER — Ambulatory Visit: Payer: Medicaid Other | Admitting: Internal Medicine

## 2023-11-07 ENCOUNTER — Encounter: Payer: Self-pay | Admitting: Internal Medicine

## 2023-11-07 VITALS — BP 121/80 | HR 73 | Temp 97.8°F | Ht 69.0 in | Wt 147.6 lb

## 2023-11-07 DIAGNOSIS — R1310 Dysphagia, unspecified: Secondary | ICD-10-CM | POA: Diagnosis not present

## 2023-11-07 DIAGNOSIS — Z8719 Personal history of other diseases of the digestive system: Secondary | ICD-10-CM | POA: Diagnosis not present

## 2023-11-07 DIAGNOSIS — R0789 Other chest pain: Secondary | ICD-10-CM

## 2023-11-07 DIAGNOSIS — K219 Gastro-esophageal reflux disease without esophagitis: Secondary | ICD-10-CM

## 2023-11-07 DIAGNOSIS — R079 Chest pain, unspecified: Secondary | ICD-10-CM

## 2023-11-07 DIAGNOSIS — R1319 Other dysphagia: Secondary | ICD-10-CM

## 2023-11-07 NOTE — Patient Instructions (Signed)
 It was good to see you again today!  As discussed we will go ahead and schedule an EGD with possible esophageal dilation and a screening colonoscopy.    The last cardiac asked I see is your echo done the first of the week we will see that that is okay before we end up performing the GI procedures.  We should know anytime now.  For now continue Dexilant 60 mg daily  Further recommendations to follow.

## 2023-11-07 NOTE — Progress Notes (Signed)
 Primary Care Physician:  Elfredia Nevins, MD Primary Gastroenterologist:  Dr. Jena Gauss  Pre-Procedure History & Physical: HPI:  Sharon Gay is a 58 y.o. female here  for further evaluation of chest pain vague dysphagia.  Feels neck fullness neck throat pain been seen at the ED has had a cardiology evaluation.  Wrap-up pending with an echo done first of the week results not known to me at this point in time overdue to have a EGD with esophageal dilation typical reflux symptoms well-controlled on Dexilant 60 mg daily.  Overdue for colorectal cancer screening last exam 2006 negative.  No lower GI tract symptoms at this time.  Continues to smoke.  Takes Dexilant 60 mg daily for control of her typical reflux symptoms. Has issues with general bone pain.  She tells me her PCP may be sending her to a rheumatologist in the near future.  Past Medical History:  Diagnosis Date   AC (acromioclavicular) joint bone spurs    Anxiety    Arthritis    Back pain    Chest pain 2003   11/2007: Admission with unstable angina; normal coronary angiography  with [facility]; nl EF   Chronic pain    Colitis 2005   COPD (chronic obstructive pulmonary disease) (HCC)    11/2003: Normal PFTs ex minimal increased volumes and decreased the DLCO; Followed in Pulmonary clinic/ [facility] [place]    Depression    Gastroesophageal reflux disease 2009   Hiatal hernia; esophageal dilatation for Schatzki's ring in 04/2008; chronic constipation; irritable bowel syndrome   History of pneumonia    Hyperlipidemia 2009   Lipid profile 07/2012:227, 91, 56, 153   Lung disease 2010   Migraines    Mitral valve prolapse    Mitral valve prolapse    MS (multiple sclerosis) (HCC)    MS (multiple sclerosis) (HCC) 2001   Mycobacterium avium complex (HCC) 2010   01/2009: + AFB stain on bronchial washings   PONV (postoperative nausea and vomiting)    Postmenopausal 10/31/2015   Smoker 10/31/2015    Past  Surgical History:  Procedure Laterality Date   COLONOSCOPY W/ POLYPECTOMY  2006   Dr. Jena Gauss: 3 diminutive polyps in descending colon, unknown path    ESOPHAGOGASTRODUODENOSCOPY  2009   Dr. Jena Gauss: normal esophagus, small hiatal hernia, s/p empiric dilatilon    ESOPHAGOGASTRODUODENOSCOPY  2003   Schatzki's ring s/p dilation   FIBEROPTIC BRONCHOSCOPY  2010   LIVER BIOPSY     ORIF RADIAL FRACTURE  2012   Left   VOCAL CORD POLYP REMOVED  2003    Prior to Admission medications   Medication Sig Start Date End Date Taking? Authorizing Provider  albuterol (PROVENTIL HFA;VENTOLIN HFA) 108 (90 BASE) MCG/ACT inhaler Inhale 1-2 puffs into the lungs every 6 (six) hours as needed for wheezing or shortness of breath. Patient taking differently: Inhale 2 puffs into the lungs every 6 (six) hours as needed for wheezing or shortness of breath. 08/25/15  Yes Ivery Quale, PA-C  Alpha-Lipoic Acid 100 MG CAPS Take 2 capsules by mouth 3 (three) times daily. 08/20/23  Yes [provider]  ALPRAZolam Prudy Feeler) 1 MG tablet Take 1 mg by mouth 3 (three) times daily as needed for anxiety.   Yes [provider]  Aromatic Inhalants (VICKS VAPOR INHALER IN) Inhale 1 puff into the lungs 4 (four) times daily as needed (congestion).   Yes [provider]  aspirin EC 81 MG tablet Take 81 mg by mouth  See admin instructions. Take 81 mg daily, may also take 81 mg twice daily as needed for chest pain   Yes [provider]  atorvastatin (LIPITOR) 20 MG tablet Take 20 mg by mouth daily.   Yes [provider]  Cholecalciferol (VITAMIN D3 MAXIMUM STRENGTH) 125 MCG (5000 UT) capsule Take 5,000 Units by mouth daily.   Yes [provider]  Coenzyme Q10 (COQ10) 100 MG CAPS Take 100 mg by mouth daily.   Yes [provider]  dexlansoprazole (DEXILANT) 60 MG capsule Take 1 capsule (60 mg total) by mouth daily before breakfast. 02/11/23  Yes Tiffany Kocher, PA-C  fluticasone  (FLONASE) 50 MCG/ACT nasal spray Place 2 sprays into both nostrils daily as needed for allergies.   Yes [provider]  gabapentin (NEURONTIN) 100 MG capsule Take 100 mg by mouth 3 (three) times daily as needed (pain). 03/11/23  Yes [provider]  lidocaine (XYLOCAINE) 5 % ointment Apply 1 application  topically daily as needed for moderate pain. 11/28/15  Yes [provider]  meloxicam (MOBIC) 7.5 MG tablet Take 7.5 mg by mouth daily as needed for pain.   Yes [provider]  nicotine (NICODERM CQ - DOSED IN MG/24 HOURS) 21 mg/24hr patch Place 21 mg onto the skin daily.   Yes [provider]  Omega-3 Fatty Acids (FISH OIL PO) Take 1 capsule by mouth daily.   Yes [provider]  oxycodone (ROXICODONE) 30 MG immediate release tablet Take 30 mg by mouth 4 (four) times daily as needed for pain.   Yes [provider]  PARoxetine (PAXIL) 20 MG tablet Take 20 mg by mouth daily.   Yes [provider]  Polyethyl Glycol-Propyl Glycol (LUBRICATING EYE DROPS OP) Place 1 drop into both eyes daily as needed (dry eyes).   Yes [provider]  promethazine (PHENERGAN) 25 MG tablet Take 25 mg by mouth every 6 (six) hours as needed for nausea or vomiting.   Yes [provider]  rizatriptan (MAXALT) 10 MG tablet As needed for headache; may repeat in 2 hours if needed; max 2 per day or 8 per month 10/07/22  Yes Penumalli, Glenford Bayley, MD    Allergies as of 11/07/2023 - Review Complete 11/07/2023  Allergen Reaction Noted   Penicillins Anaphylaxis, Nausea And Vomiting, and Swelling 08/10/2012   Morphine Other (See Comments)    Rifampin Nausea Only and Other (See Comments) 02/06/2010   Topamax [topiramate] Other (See Comments) 10/26/2015   Cymbalta [duloxetine hcl] Other (See Comments) 10/26/2015   Tequin [gatifloxacin] Nausea And Vomiting 08/10/2012    Family History  Problem Relation Age of Onset   Emphysema Maternal  Grandfather        was a smoker   Arthritis Sister    Mental retardation Sister    Heart disease Sister        Also brother; cardiomyopathy   Thyroid disease Sister    Fibromyalgia Sister    Arthritis Sister    Migraines Sister    COPD Sister    Heart attack Father 79   Arthritis Sister    Migraines Sister    Other Sister        brain tumor   Arthritis Brother    Arthritis Brother    Colon cancer Neg Hx     Social History   Socioeconomic History   Marital status: Married    Spouse name: Not on file   Number of children: 2   Years of  education: Not on file   Highest education level: Not on file  Occupational History   Occupation: Automotive  Tobacco Use   Smoking status: Every Day    Current packs/day: 0.25    Average packs/day: 0.3 packs/day for 30.0 years (7.5 ttl pk-yrs)    Types: Cigarettes   Smokeless tobacco: Never   Tobacco comments:    smokes 4-5 cig daily  Vaping Use   Vaping status: Never Used  Substance and Sexual Activity   Alcohol use: No   Drug use: No   Sexual activity: Yes    Birth control/protection: Post-menopausal  Other Topics Concern   Not on file  Social History Narrative   2 stepchildren   Social Drivers of Health   Financial Resource Strain: Medium Risk (06/22/2020)   Overall Financial Resource Strain (CARDIA)    Difficulty of Paying Living Expenses: Somewhat hard  Food Insecurity: No Food Insecurity (06/22/2020)   Hunger Vital Sign    Worried About Running Out of Food in the Last Year: Never true    Ran Out of Food in the Last Year: Never true  Transportation Needs: No Transportation Needs (06/22/2020)   PRAPARE - Administrator, Civil Service (Medical): No    Lack of Transportation (Non-Medical): No  Physical Activity: Sufficiently Active (06/22/2020)   Exercise Vital Sign    Days of Exercise per Week: 5 days    Minutes of Exercise per Session: 30 min  Stress: Stress Concern Present (06/22/2020)   [employer] - Occupational Stress Questionnaire    Feeling of Stress : Very much  Social Connections: Unknown (04/24/2023)   [facility] from [facility]   Social Network    Social Network: Not on file  Intimate Partner Violence: Unknown (04/24/2023)   Received from [facility]   HITS    Physically Hurt: Not on file    Insult or Talk Down To: Not on file    Threaten Physical Harm: Not on file    Scream or Curse: Not on file    Review of Systems: See HPI, otherwise negative ROS  Physical Exam: BP 121/80 (BP Location: Right Arm, Patient Position: Sitting, Cuff Size: Normal)   Pulse 73   Temp 97.8 F (36.6 C) (Oral)   Ht 5\' 9"  (1.753 m)   Wt 147 lb 9.6 oz (67 kg)   SpO2 99%   BMI 21.80 kg/m  General:   Alert,  Well-developed, well-nourished, pleasant and cooperative in NAD Lungs:  Clear throughout to auscultation.   No wheezes, crackles, or rhonchi. No acute distress. Heart:  Regular rate and rhythm; no murmurs, clicks, rubs,  or gallops. Abdomen: Non-distended, normal bowel sounds.  Soft and nontender without appreciable mass or hepatosplenomegaly.   Impression/Plan: Chest pain of uncertain etiology.  Less likely cardiac in origin.  Typical GERD symptoms seem to be well-controlled.  This may be a primary ENT issue she does c/o esophageal dysphagia and, therefore, she does need her upper GI tract evaluated.  History of a Schatzki's ring dilated in the distant past.  Overdue for colorectal cancer screening.  Recommendations:  As discussed we will go ahead and schedule an EGD with possible esophageal dilation and a screening colonoscopy.    For now continue Dexilant 60 mg daily  The risks, benefits, limitations, alternatives and imponderables have been reviewed with the patient. Potential for esophageal dilation, biopsy, etc. have also been reviewed.  Questions have been answered. All parties agreeable.   Endo pending  echo being OK  Further recommendations  to follow.   Notice: This dictation was prepared with Dragon dictation along with smaller phrase technology. Any transcriptional errors that result from this process are unintentional and may not be corrected upon review.

## 2023-11-14 ENCOUNTER — Encounter: Payer: Self-pay | Admitting: *Deleted

## 2023-11-14 ENCOUNTER — Telehealth: Payer: Self-pay | Admitting: Cardiology

## 2023-11-14 DIAGNOSIS — R072 Precordial pain: Secondary | ICD-10-CM

## 2023-11-14 MED ORDER — METOPROLOL TARTRATE 100 MG PO TABS: 100.0000 mg | ORAL_TABLET | ORAL | 0 refills | Status: DC

## 2023-11-14 NOTE — Telephone Encounter (Signed)
 Normal echo. Can we please order a coronary CTA for chest pain   Dominga Ferry MD  Pt notified and agrees with plan of care.

## 2023-11-14 NOTE — Telephone Encounter (Signed)
 Pt c/o medication issue:  1. Name of Medication: metoprolol tartrate (LOPRESSOR) 100 MG tablet   2. How are you currently taking this medication (dosage and times per day)? As written   3. Are you having a reaction (difficulty breathing--STAT)? No   4. What is your medication issue? Pharmacy called in stating they need further clarification on instructions.

## 2023-11-14 NOTE — Telephone Encounter (Signed)
 Order for cardiac CT, lopressor and lab work all placed.

## 2023-11-14 NOTE — Telephone Encounter (Signed)
 Pt returning call to a nurse for results

## 2023-11-14 NOTE — Telephone Encounter (Signed)
 Medication clarification given to pharmacy.   Sig to read: Take 2 hours prior to CT scan.

## 2023-11-25 ENCOUNTER — Telehealth: Payer: Self-pay | Admitting: *Deleted

## 2023-11-25 NOTE — Telephone Encounter (Signed)
 You wanted confirmation that pt's echo was normal prior to her being scheduled for procedure. She is having coronary CTA done 12/01/23. Please advise. Thank you

## 2023-11-25 NOTE — Telephone Encounter (Signed)
 Under procedures echocardiogram complete

## 2023-11-28 ENCOUNTER — Telehealth (HOSPITAL_COMMUNITY): Payer: Self-pay | Admitting: *Deleted

## 2023-11-28 NOTE — Telephone Encounter (Signed)
Reaching out to patient to offer assistance regarding upcoming cardiac imaging study; pt verbalizes understanding of appt date/time, parking situation and where to check in, pre-test NPO status and medications ordered, and verified current allergies; name and call back number provided for further questions should they arise  Larey Brick RN Navigator Cardiac Imaging [facility] [phone] office [phone] cell  Patient to take 100mg  metoprolol tartrate two hours prior to her cardiac CT scan. She is aware to arrive at 8 AM.

## 2023-12-01 ENCOUNTER — Ambulatory Visit (HOSPITAL_COMMUNITY)
Admission: RE | Admit: 2023-12-01 | Discharge: 2023-12-01 | Disposition: A | Discharge status: Home / Self Care | Source: Ambulatory Visit | Attending: Cardiology | Admitting: Cardiology

## 2023-12-01 DIAGNOSIS — R072 Precordial pain: Secondary | ICD-10-CM | POA: Diagnosis not present

## 2023-12-01 MED ORDER — NITROGLYCERIN 0.4 MG SL SUBL
0.8000 mg | SUBLINGUAL_TABLET | Freq: Once | SUBLINGUAL | Status: AC
  Administered 2023-12-01: 0.8 mg via SUBLINGUAL

## 2023-12-01 MED ORDER — NITROGLYCERIN 0.4 MG SL SUBL
SUBLINGUAL_TABLET | SUBLINGUAL | Status: AC
Start: 2023-12-01 — End: ?
  Filled 2023-12-01: qty 2

## 2023-12-01 MED ORDER — IOHEXOL 350 MG/ML SOLN
100.0000 mL | Freq: Once | INTRAVENOUS | Status: AC | PRN
  Administered 2023-12-01: 100 mL via INTRAVENOUS

## 2023-12-08 ENCOUNTER — Institutional Professional Consult (permissible substitution) (INDEPENDENT_AMBULATORY_CARE_PROVIDER_SITE_OTHER): Admitting: Otolaryngology

## 2023-12-09 NOTE — Telephone Encounter (Signed)
Report placed on providers desk.

## 2023-12-09 NOTE — Telephone Encounter (Signed)
 Will call to schedule once get providers schedule for May

## 2023-12-22 ENCOUNTER — Other Ambulatory Visit: Payer: Self-pay | Admitting: *Deleted

## 2023-12-22 ENCOUNTER — Encounter: Payer: Self-pay | Admitting: *Deleted

## 2023-12-22 MED ORDER — NA SULFATE-K SULFATE-MG SULF 17.5-3.13-1.6 GM/177ML PO SOLN: ORAL | 0 refills | Status: DC

## 2024-01-05 NOTE — Telephone Encounter (Signed)
 Pt has been scheduled for 01/08/24. Instructions mailed and prep sent to the pharmacy  [facility] PA:  Status: APPROVED Authorization #: [id number] Requested Dates: Jan 08, 2024 - Apr 07, 2024

## 2024-01-07 NOTE — Telephone Encounter (Signed)
 Pt called to cancel her procedure due to not being prepared. She wants a Monday or Wednesday, will call to reschedule her in June once we get providers schedule.

## 2024-01-08 ENCOUNTER — Ambulatory Visit (HOSPITAL_COMMUNITY): Admission: RE | Admit: 2024-01-08 | Source: Home / Self Care | Admitting: Internal Medicine

## 2024-01-08 ENCOUNTER — Encounter (HOSPITAL_COMMUNITY): Admission: RE | Payer: Self-pay | Source: Home / Self Care

## 2024-01-08 SURGERY — COLONOSCOPY
Anesthesia: Choice

## 2024-01-12 ENCOUNTER — Encounter: Payer: Self-pay | Admitting: *Deleted

## 2024-01-12 NOTE — Telephone Encounter (Signed)
 Pt has been rescheduled for 02/18/24. Updated instructions mailed

## 2024-01-26 ENCOUNTER — Encounter: Payer: Self-pay | Admitting: Internal Medicine

## 2024-01-26 ENCOUNTER — Ambulatory Visit: Admitting: Internal Medicine

## 2024-01-29 ENCOUNTER — Other Ambulatory Visit: Payer: Self-pay | Admitting: Gastroenterology

## 2024-02-03 ENCOUNTER — Other Ambulatory Visit: Payer: Self-pay

## 2024-02-03 MED ORDER — DEXLANSOPRAZOLE 60 MG PO CPDR: 60.0000 mg | DELAYED_RELEASE_CAPSULE | Freq: Every day | ORAL | 3 refills | Status: AC

## 2024-02-16 ENCOUNTER — Encounter: Payer: Self-pay | Admitting: *Deleted

## 2024-02-16 ENCOUNTER — Encounter (HOSPITAL_COMMUNITY)
Admission: RE | Admit: 2024-02-16 | Discharge: 2024-02-16 | Disposition: A | Discharge status: Home / Self Care | Source: Ambulatory Visit | Attending: Internal Medicine | Admitting: Internal Medicine

## 2024-02-16 ENCOUNTER — Telehealth: Payer: Self-pay | Admitting: *Deleted

## 2024-02-16 NOTE — Patient Instructions (Signed)
 Sharon Gay  02/16/2024     @PREFPERIOPPHARMACY @   Your procedure is scheduled on  02/18/2024.   Report to [facility] at  [phone] A.M.   Call this number if you have problems the morning of surgery:  [phone]  If you experience any cold or flu symptoms such as cough, fever, chills, shortness of breath, etc. between now and your scheduled surgery, please notify us  at the above number.   Remember:  Follow the diet and prep instructions given to you by the office.    You may drink clear liquids until  0430 am on 02/18/2024.    Clear liquids allowed are:                    Water, Juice (No red color; non-citric and without pulp; diabetics please choose diet or no sugar options), Carbonated beverages (diabetics please choose diet or no sugar options), Clear Tea (No creamer, milk, or cream, including half & half and powdered creamer), Black Coffee Only (No creamer, milk or cream, including half & half and powdered creamer), and Clear Sports drink (No red color; diabetics please choose diet or no sugar options)    Take these medicines the morning of surgery with A SIP OF WATER         alprazolam (if needed), dexlansoprazole , gabapentin, metoprolol , meloxicam , oxycodone (if needed), paroxetine , rizatriptan .     Do not wear jewelry, make-up or nail polish, including gel polish,  artificial nails, or any other type of covering on natural nails (fingers and  toes).  Do not wear lotions, powders, or perfumes, or deodorant.  Do not shave 48 hours prior to surgery.  Men may shave face and neck.  Do not bring valuables to the hospital.  [facility] is not responsible for any belongings or valuables.  Contacts, dentures or bridgework may not be worn into surgery.  Leave your suitcase in the car.  After surgery it may be brought to your room.  For patients admitted to the hospital, discharge time will be determined by your treatment team.  Patients discharged the day of  surgery will not be allowed to drive home and must have someone with them for 24 hours.    Special instructions:   DO NOT smoke tobacco or vape for 24 hours before your procedure.  Please read over the following fact sheets that you were given. Coughing and Deep Breathing, Surgical Site Infection Prevention, Anesthesia Post-op Instructions, and Care and Recovery After Surgery      Upper Endoscopy, Adult, Care After After the procedure, it is common to have a sore throat. It is also common to have: Mild stomach pain or discomfort. Bloating. Nausea. Follow these instructions at home: The instructions below may help you care for yourself at home. Your health care provider may give you more instructions. If you have questions, ask your health care provider. If you were given a sedative during the procedure, it can affect you for several hours. Do not drive or operate machinery until your health care provider says that it is safe. If you will be going home right after the procedure, plan to have a responsible adult: Take you home from the hospital or clinic. You will not be allowed to drive. Care for you for the time you are told. Follow instructions from your health care provider about what you may eat and drink. Return to your normal activities as told by your health  care provider. Ask your health care provider what activities are safe for you. Take over-the-counter and prescription medicines only as told by your health care provider. Contact a health care provider if you: Have a sore throat that lasts longer than one day. Have trouble swallowing. Have a fever. Get help right away if you: Vomit blood or your vomit looks like coffee grounds. Have bloody, black, or tarry stools. Have a very bad sore throat or you cannot swallow. Have difficulty breathing or very bad pain in your chest or abdomen. These symptoms may be an emergency. Get help right away. Call 911. Do not wait to see if the  symptoms will go away. Do not drive yourself to the hospital. Summary After the procedure, it is common to have a sore throat, mild stomach discomfort, bloating, and nausea. If you were given a sedative during the procedure, it can affect you for several hours. Do not drive until your health care provider says that it is safe. Follow instructions from your health care provider about what you may eat and drink. Return to your normal activities as told by your health care provider. This information is not intended to replace advice given to you by your health care provider. Make sure you discuss any questions you have with your health care provider. Document Revised: 12/05/2021 Document Reviewed: 12/05/2021 Elsevier Patient Education  2024 [employer]Esophageal Dilatation Esophageal dilatation, or dilation, is done to stretch a blocked or narrowed part of your esophagus. The esophagus is the part of your body that moves food from your mouth to your stomach. You may need to have it stretched if: You have a lot of scar tissue and it makes it hard or painful to swallow. You have cancer of the esophagus. There's a problem with how food moves through your esophagus. In some cases, you may need to have this procedure done more than once. Tell a health care provider about: Any allergies you have. All medicines you're taking, including vitamins, herbs, eye drops, creams, and over-the-counter medicines. Any problems you or family members have had with anesthesia. Any bleeding problems you have. Any surgeries you've had. Any medical conditions you have. Whether you're pregnant or may be pregnant. What are the risks? Your health care provider will talk with you about risks. These may include: Bleeding. A hole or tear in your esophagus. What happens before the procedure? When to stop eating and drinking Follow instructions from your provider about what you may eat and drink. These may include: 8  hours before your procedure Stop eating most foods. Do not eat meat, fried foods, or fatty foods. Eat only light foods, such as toast or crackers. All liquids are okay except energy drinks and alcohol. 6 hours before your procedure Stop eating. Drink only clear liquids, such as water, clear fruit juice, black coffee, plain tea, and sports drinks. Do not drink energy drinks or alcohol. 2 hours before your procedure Stop drinking all liquids. You may be allowed to take medicines with small sips of water. If you don't follow your provider's instructions, your procedure may be delayed or canceled. Medicines Ask your provider about: Changing or stopping your regular medicines. These include any diabetes medicines or blood thinners you take. Taking medicines such as aspirin  and ibuprofen . These medicines can thin your blood. Do not take them unless your provider tells you to. Taking over-the-counter medicines, vitamins, herbs, and supplements. General instructions If you'll be going home right after the procedure, plan to have a  responsible adult: Take you home from the hospital or clinic. You won't be allowed to drive. Care for you for the time you're told. What happens during the procedure? You may be given: A sedative. This helps you relax. Anesthesia. This keeps you from feeling pain. It will numb certain areas of your body. The stretching may be done with: Simple dilators. These are tools put in your esophagus to stretch it. Guide wires. These wires are put in using a tube called an endoscope. A dilator is put over the wires to stretch your esophagus. Then the wires are taken out. A balloon. The balloon is on the end of a tube. It's inflated to stretch your esophagus. The procedure may vary among providers and hospitals. What can I expect after the procedure? Your blood pressure, heart rate, breathing rate, and blood oxygen level will be monitored until you leave the hospital or  clinic. Your throat may feel sore and numb. This will get better over time. You won't be allowed to eat or drink until your throat is no longer numb. You may be able to go home when you can: Drink. Pee. Sit on the edge of the bed without nausea or dizziness. Follow these instructions at home: Activity If you were given a sedative during the procedure, it can affect you for several hours. Do not drive or operate machinery until your provider says it's safe. Return to your normal activities as told by your provider. Ask your provider what activities are safe for you. General instructions Take over-the-counter and prescription medicines only as told by your provider. Follow instructions from your provider about what you may eat and drink. Do not use any products that contain nicotine  or tobacco. These products include cigarettes, chewing tobacco, and vaping devices, such as e-cigarettes. If you need help quitting, ask your provider. Keep all follow-up visits. Your provider will make sure the procedure worked. Where to find more information American Society for Gastrointestinal Endoscopy (ASGE): asge.org Contact a health care provider if: You have trouble swallowing. You have a fever. Your pain doesn't get better with medicine. Get help right away if: You have chest pain. You have trouble breathing. You vomit blood. Your poop is: Black. Tarry. Bloody. These symptoms may be an emergency. Get help right away. Call 911. Do not wait to see if the symptoms will go away. Do not drive yourself to the hospital. This information is not intended to replace advice given to you by your health care provider. Make sure you discuss any questions you have with your health care provider. Document Revised: 11/22/2022 Document Reviewed: 11/22/2022 Elsevier Patient Education  2024 [employer].Colonoscopy, Adult, Care After The following information offers guidance on how to care for yourself after your  procedure. Your health care provider may also give you more specific instructions. If you have problems or questions, contact your health care provider. What can I expect after the procedure? After the procedure, it is common to have: A small amount of blood in your stool for 24 hours after the procedure. Some gas. Mild cramping or bloating of your abdomen. Follow these instructions at home: Eating and drinking  Drink enough fluid to keep your urine pale yellow. Follow instructions from your health care provider about eating or drinking restrictions. Resume your normal diet as told by your health care provider. Avoid heavy or fried foods that are hard to digest. Activity Rest as told by your health care provider. Avoid sitting for a long time without moving.  Get up to take short walks every 1-2 hours. This is important to improve blood flow and breathing. Ask for help if you feel weak or unsteady. Return to your normal activities as told by your health care provider. Ask your health care provider what activities are safe for you. Managing cramping and bloating  Try walking around when you have cramps or feel bloated. If directed, apply heat to your abdomen as told by your health care provider. Use the heat source that your health care provider recommends, such as a moist heat pack or a heating pad. Place a towel between your skin and the heat source. Leave the heat on for 20-30 minutes. Remove the heat if your skin turns bright red. This is especially important if you are unable to feel pain, heat, or cold. You have a greater risk of getting burned. General instructions If you were given a sedative during the procedure, it can affect you for several hours. Do not drive or operate machinery until your health care provider says that it is safe. For the first 24 hours after the procedure: Do not sign important documents. Do not drink alcohol. Do your regular daily activities at a slower pace  than normal. Eat soft foods that are easy to digest. Take over-the-counter and prescription medicines only as told by your health care provider. Keep all follow-up visits. This is important. Contact a health care provider if: You have blood in your stool 2-3 days after the procedure. Get help right away if: You have more than a small spotting of blood in your stool. You have large blood clots in your stool. You have swelling of your abdomen. You have nausea or vomiting. You have a fever. You have increasing pain in your abdomen that is not relieved with medicine. These symptoms may be an emergency. Get help right away. Call 911. Do not wait to see if the symptoms will go away. Do not drive yourself to the hospital. Summary After the procedure, it is common to have a small amount of blood in your stool. You may also have mild cramping and bloating of your abdomen. If you were given a sedative during the procedure, it can affect you for several hours. Do not drive or operate machinery until your health care provider says that it is safe. Get help right away if you have a lot of blood in your stool, nausea or vomiting, a fever, or increased pain in your abdomen. This information is not intended to replace advice given to you by your health care provider. Make sure you discuss any questions you have with your health care provider. Document Revised: 10/08/2022 Document Reviewed: 04/18/2021 Elsevier Patient Education  2024 [employer].General Anesthesia, Adult, Care After The following information offers guidance on how to care for yourself after your procedure. Your health care provider may also give you more specific instructions. If you have problems or questions, contact your health care provider. What can I expect after the procedure? After the procedure, it is common for people to: Have pain or discomfort at the IV site. Have nausea or vomiting. Have a sore throat or hoarseness. Have  trouble concentrating. Feel cold or chills. Feel weak, sleepy, or tired (fatigue). Have soreness and body aches. These can affect parts of the body that were not involved in surgery. Follow these instructions at home: For the time period you were told by your health care provider:  Rest. Do not participate in activities where you could fall or  become injured. Do not drive or use machinery. Do not drink alcohol. Do not take sleeping pills or medicines that cause drowsiness. Do not make important decisions or sign legal documents. Do not take care of children on your own. General instructions Drink enough fluid to keep your urine pale yellow. If you have sleep apnea, surgery and certain medicines can increase your risk for breathing problems. Follow instructions from your health care provider about wearing your sleep device: Anytime you are sleeping, including during daytime naps. While taking prescription pain medicines, sleeping medicines, or medicines that make you drowsy. Return to your normal activities as told by your health care provider. Ask your health care provider what activities are safe for you. Take over-the-counter and prescription medicines only as told by your health care provider. Do not use any products that contain nicotine  or tobacco. These products include cigarettes, chewing tobacco, and vaping devices, such as e-cigarettes. These can delay incision healing after surgery. If you need help quitting, ask your health care provider. Contact a health care provider if: You have nausea or vomiting that does not get better with medicine. You vomit every time you eat or drink. You have pain that does not get better with medicine. You cannot urinate or have bloody urine. You develop a skin rash. You have a fever. Get help right away if: You have trouble breathing. You have chest pain. You vomit blood. These symptoms may be an emergency. Get help right away. Call 911. Do  not wait to see if the symptoms will go away. Do not drive yourself to the hospital. Summary After the procedure, it is common to have a sore throat, hoarseness, nausea, vomiting, or to feel weak, sleepy, or fatigue. For the time period you were told by your health care provider, do not drive or use machinery. Get help right away if you have difficulty breathing, have chest pain, or vomit blood. These symptoms may be an emergency. This information is not intended to replace advice given to you by your health care provider. Make sure you discuss any questions you have with your health care provider. Document Revised: 11/23/2021 Document Reviewed: 11/23/2021 Elsevier Patient Education  2024 [employer].

## 2024-02-16 NOTE — Telephone Encounter (Signed)
 Pt informed that pre-op is scheduled for Monday 03/22/24 at 9:30 am. She states that day is not good for her. Gave number to call to reschedule her pre-op to a time that would better fit her schedule.

## 2024-02-16 NOTE — Telephone Encounter (Signed)
 Pt called and said she missed her pre-op this morning and has not gotten her instructions. She has been rescheduled from 02/18/24 to 03/24/24 at 11 am

## 2024-02-17 ENCOUNTER — Encounter: Payer: Self-pay | Admitting: *Deleted

## 2024-03-22 ENCOUNTER — Encounter (HOSPITAL_COMMUNITY): Payer: Self-pay

## 2024-03-22 ENCOUNTER — Other Ambulatory Visit (HOSPITAL_COMMUNITY)

## 2024-03-22 ENCOUNTER — Encounter (HOSPITAL_COMMUNITY)
Admission: RE | Admit: 2024-03-22 | Discharge: 2024-03-22 | Disposition: A | Discharge status: Home / Self Care | Source: Ambulatory Visit | Attending: Internal Medicine | Admitting: Internal Medicine

## 2024-03-22 ENCOUNTER — Other Ambulatory Visit: Payer: Self-pay

## 2024-03-24 ENCOUNTER — Encounter (HOSPITAL_COMMUNITY): Payer: Self-pay | Admitting: Anesthesiology

## 2024-03-24 ENCOUNTER — Ambulatory Visit (HOSPITAL_COMMUNITY): Admission: RE | Admit: 2024-03-24 | Source: Home / Self Care | Admitting: Internal Medicine

## 2024-03-24 ENCOUNTER — Telehealth: Payer: Self-pay | Admitting: *Deleted

## 2024-03-24 ENCOUNTER — Encounter (HOSPITAL_COMMUNITY): Admission: RE | Payer: Self-pay | Source: Home / Self Care

## 2024-03-24 SURGERY — COLONOSCOPY
Anesthesia: Choice

## 2024-03-24 NOTE — Telephone Encounter (Signed)
 Devere CHRISTELLA Lodge, RN:  Pt did not show for double today...called her and she woke up with a migraine and one cup of prep causes N/V. Pt will call office to reschedule

## 2024-03-24 NOTE — Progress Notes (Signed)
 Pt did not show for colonoscopy/EGD scheduled for today.  Called pt, states she woke up with a migraine this morning and one cup of the prep caused N/V.  I notified office to reschedule and pt stated she will also call office and reschedule.

## 2024-04-07 NOTE — Telephone Encounter (Signed)
 Patient will need OV prior to rescheduling procedure. Has rescheduled several times. She has not been seen since 10/2023

## 2024-04-09 ENCOUNTER — Other Ambulatory Visit (HOSPITAL_COMMUNITY): Payer: Self-pay | Admitting: Internal Medicine

## 2024-04-09 DIAGNOSIS — M5412 Radiculopathy, cervical region: Secondary | ICD-10-CM

## 2024-04-09 DIAGNOSIS — M546 Pain in thoracic spine: Secondary | ICD-10-CM

## 2024-04-13 NOTE — Progress Notes (Signed)
 Office Visit Note  Patient: Sharon Gay             Date of Birth: [birth year]           MRN: [id number]             PCP: Bertell Satterfield, MD Referring: Bertell Satterfield, MD Visit Date: 04/14/2024   Subjective:  New Patient (Initial Visit) (Abnormal labs)   Discussed the use of AI scribe software for clinical note transcription with the patient, who gave verbal consent to proceed.  History of Present Illness   Sharon Gay is a 58 year old female with multiple sclerosis who presents with persistent joint pain and abnormal blood test results, including a positive ANA.  She experiences persistent joint pain and stiffness, described as deep bone pain and throbbing, affecting her bones, feet, elbows, and joints. The pain has been ongoing for years, with an increase in frequency to a weekly basis. She uses lidocaine , oxycodone , meloxicam , and over-the-counter topical treatments like [facility], but finds them insufficient at times. She also experiences swelling in her fingers and ankles, particularly in the morning, with variable resolution throughout the day.  She has a history of multiple sclerosis diagnosed in 2000-2001, initially presenting with numbness and tingling on the left side, leading to the discovery of a lesion on her head. She was treated with three different medications, which she discontinued due to adverse effects, including difficulty breathing. She also reports circulation issues, with tingling and numbness in her fingers, and occasional color changes to purple or white.  She experiences skin tenderness and sensitivity, describing it as painful and ticklish to the touch, even when her poodle brushes against her. This symptom is persistent and affects her daily life. She also reports dry skin, which she attributes to recent use of calamine lotion.  She has a history of migraines since her early thirties, which were severe enough to require Demerol and Phenergan  injections.  The frequency of migraines has decreased over time, but she still experiences concentration and short-term memory issues. She has been hospitalized for suspected strokes, but a neurologist later indicated she had not experienced strokes, attributing symptoms to severe migraines.  She reports poor sleep quality, waking up every 20-30 minutes without any specific trigger. She does not take any sleep aids due to concerns about side effects.  She has been told she has fibromyalgia, osteoporosis, and pinched nerves along her spine. She maintains a level of physical activity, including walking and light exercise, but is limited by pain and stiffness.  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 She is a former smoker.  She reports a history of acid reflux and takes medication for it. She has been informed of a Schatzki's ring in her esophagus, which causes swallowing difficulties.      Labs reviewed 11/2023 ANA 1: 40 speckled RF negative ESR 2   Activities of Daily Living:  Patient reports morning stiffness for 2-3 hours.  - Patient Reports nocturnal pain.  Difficulty dressing/grooming: Denies Difficulty climbing stairs: Reports Difficulty getting out of chair: Reports Difficulty using hands for taps, buttons, cutlery, and/or writing: Reports  Review of Systems  Constitutional:  Positive for fatigue.  HENT:  Positive for mouth sores and mouth dryness.   Eyes:  Positive for dryness.  Respiratory:  Positive for shortness of breath.   Cardiovascular:  Negative for chest  pain and palpitations.  Gastrointestinal:  Positive for constipation. Negative for blood in stool and diarrhea.  Endocrine: Negative for increased urination.  Genitourinary:  Negative for involuntary urination.  Musculoskeletal:  Positive for joint pain, gait problem, joint pain, joint swelling, myalgias, muscle  weakness, morning stiffness, muscle tenderness and myalgias.  Skin:  Positive for sensitivity to sunlight. Negative for color change, rash and hair loss.  Allergic/Immunologic: Negative for susceptible to infections.  Neurological:  Positive for dizziness and headaches.  Hematological:  Positive for swollen glands.  Psychiatric/Behavioral:  Positive for depressed mood and sleep disturbance. The patient is nervous/anxious.     PMFS History:  Patient Active Problem List   Diagnosis Date Noted   Polyarthralgia 04/14/2024   Positive ANA (antinuclear antibody) 04/14/2024   Constipation 07/01/2023   Upper abdominal pain 02/11/2023   Esophageal dysphagia 02/11/2023   GERD (gastroesophageal reflux disease) 02/11/2023   Encounter for gynecological examination with Papanicolaou smear of cervix 06/22/2020   Encounter for screening colonoscopy 06/22/2020   Dyspareunia in female 06/22/2020   Vaginal dryness 06/22/2020   Pelvic pain 06/22/2020   Arthritis 06/20/2018   Former smoker 10/31/2015   Postmenopausal 10/31/2015   History of colonic polyps 10/20/2015   Dysphagia 10/20/2015   Hepatomegaly 10/20/2015   Lymphadenopathy 09/07/2015   CAP (community acquired pneumonia) 08/25/2015   Severe sepsis with septic shock (HCC) 08/25/2015   SIRS (systemic inflammatory response syndrome) (HCC) 08/25/2015   Abdominal pain 08/09/2015   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   History of diagnostic tests 10/06/2012   Hyperlipidemia    Mitral valve prolapse    Anxiety    Asthmatic bronchitis , chronic (HCC)    Chest pain    Gastroesophageal reflux disease    Migraine headache 03/30/2009    Past Medical History:  Diagnosis Date   AC (acromioclavicular) joint bone spurs    Anxiety    Arthritis    Back pain    Chest pain 09/09/2001   11/2007: Admission with unstable angina; normal coronary angiography  with [facility]; nl EF   Chronic pain    Colitis  09/10/2003   COPD (chronic obstructive pulmonary disease) (HCC)    11/2003: Normal PFTs ex minimal increased volumes and decreased the DLCO; Followed in Pulmonary clinic/ [facility]/ [place]    Depression    Gastroesophageal reflux disease 09/10/2007   Hiatal hernia; esophageal dilatation for Schatzki's ring in 04/2008; chronic constipation; irritable bowel syndrome   History of pneumonia    Hyperlipidemia 09/10/2007   Lipid profile 07/2012:227, 91, 56, 153   Lung disease 09/09/2008   Lupus (systemic lupus erythematosus) (HCC) 09/2023   Migraines    Mitral valve prolapse    Mitral valve prolapse    MS (multiple sclerosis) (HCC)    MS (multiple sclerosis) (HCC) 09/10/1999   Mycobacterium avium complex (HCC) 09/09/2008   01/2009: + AFB stain on bronchial washings   PONV (postoperative nausea and vomiting)    Postmenopausal 10/31/2015   Smoker 10/31/2015   Stroke (HCC) 2019   x4    Family History  Problem Relation Age of Onset   Cancer Mother        cause of death   Heart attack Father 89   Fibromyalgia Sister    Arthritis Sister    Mental retardation Sister    Heart disease Sister        Also brother; cardiomyopathy   Thyroid  disease Sister    Fibromyalgia Sister    Arthritis Sister  Migraines Sister    COPD Sister    Arthritis Sister    Migraines Sister    Other Sister        brain tumor   Diabetes Brother    Hypertension Brother    Arthritis Brother    Diabetes Brother    Hypertension Brother    Arthritis Brother    Diabetes Brother    Hypertension Brother    Diabetes Brother    Hypertension Brother    Cancer Brother        cause of death   Diabetes Brother    Hypertension Brother    Arthritis Brother    Emphysema Maternal Grandfather        was a smoker   Colon cancer Neg Hx    Past Surgical History:  Procedure Laterality Date   COLONOSCOPY W/ POLYPECTOMY  2006   Dr. Shaaron: 3 diminutive polyps in descending colon, unknown path     ESOPHAGOGASTRODUODENOSCOPY  2009   Dr. Shaaron: normal esophagus, small hiatal hernia, s/p empiric dilatilon    ESOPHAGOGASTRODUODENOSCOPY  2003   Schatzki's ring s/p dilation   FIBEROPTIC BRONCHOSCOPY  2010   LIVER BIOPSY     ORIF RADIAL FRACTURE  2012   Left   VOCAL CORD POLYP REMOVED  2003   Social History   Social History Narrative   2 stepchildren   Immunization History  Administered Date(s) Administered   Influenza-Unspecified 05/04/2015, 09/10/2018   Pneumococcal Polysaccharide-23 08/26/2015   Pneumococcal-Unspecified 04/07/2010     Objective: Vital Signs: BP 115/77 (BP Location: Right Arm, Patient Position: Sitting, Cuff Size: Normal)   Pulse 73   Resp 16   Ht 5' 8 (1.727 m)   Wt 148 lb 3.2 oz (67.2 kg)   BMI 22.53 kg/m    Physical Exam Eyes:     Conjunctiva/sclera: Conjunctivae normal.  Cardiovascular:     Rate and Rhythm: Normal rate and regular rhythm.  Pulmonary:     Effort: Pulmonary effort is normal.     Breath sounds: Normal breath sounds.  Lymphadenopathy:     Cervical: No cervical adenopathy (Palpable submandibular node).  Skin:    General: Skin is warm and dry.  Neurological:     Mental Status: She is alert.  Psychiatric:        Mood and Affect: Mood normal.      Musculoskeletal Exam:  Shoulders full ROM no tenderness or swelling Elbows full ROM no tenderness or swelling Wrists full ROM no tenderness or swelling Fingers full ROM no tenderness or swelling Neck and back paraspinal muscle tenderness to pressure, increased tone and guarding Knees full ROM no tenderness or swelling Ankles full ROM no tenderness or swelling, some tightness in lower calf muscles and slightly exaggerated pronation and eversion movement MTPs full ROM no tenderness or swelling    Investigation: No additional findings.  Imaging: No results found.  Recent Labs: Lab Results  Component Value Date   WBC 8.4 10/04/2023   HGB 13.6 10/04/2023   PLT 252  10/04/2023   NA 140 10/04/2023   K 3.6 10/04/2023   CL 105 10/04/2023   CO2 26 10/04/2023   GLUCOSE 84 10/04/2023   BUN 11 10/04/2023   CREATININE 0.72 10/04/2023   BILITOT 0.5 10/04/2023   ALKPHOS 53 10/04/2023   AST 17 10/04/2023   ALT 15 10/04/2023   PROT 6.7 10/04/2023   ALBUMIN 3.8 10/04/2023   CALCIUM  9.4 10/04/2023   GFRAA >60 09/03/2018    Speciality Comments: No  specialty comments available.  Procedures:  No procedures performed Allergies: Penicillins, Morphine , Rifampin, Topamax [topiramate], Cymbalta [duloxetine hcl], and Tequin [gatifloxacin]   Assessment / Plan:     Visit Diagnoses: Positive ANA (antinuclear antibody)  Polyarthralgia - Plan: RNP Antibody, Anti-Smith antibody, Sjogrens syndrome-A extractable nuclear antibody, Sjogrens syndrome-B extractable nuclear antibody, Anti-DNA antibody, double-stranded, C3 and C4, Beta-2  glycoprotein antibodies, Cardiolipin antibodies, IgG, IgM, IgA, Sedimentation rate, C-reactive protein Pain is deep, sharp, excruciating, with swelling in fingers and ankles. High suspicion for nerve-related pain or fibromyalgia given pain out or proportion to joint changes on exam. Differential includes autoimmune connective tissue disease, neuropathy, and fibromyalgia. - Order blood tests for inflammation markers and specific antibodies for lupus and Sjogren's syndrome. - Consider bone scan if blood tests indicate inflammation given widespread symptoms and no localizing exam. - Recommend stretching exercises targeting the calf and ankles  Fibromyalgia (possible) Possible fibromyalgia due to widespread pain and tenderness, particularly in muscles and joints. Family history noted.  Osteoarthritis Joint stiffness and swelling, particularly in fingers. Symptoms include morning stiffness and joint popping.  Plantar fasciitis Foot pain and overpronation with calf muscle tightness, particularly the soleus, contributing to altered gait. -  Recommend stretching exercises targeting the soleus muscle,  using a roller to release muscle tightness.  Multiple sclerosis (possible current symptoms) Diagnosed in 2001 with numbness, tingling, and bone aching. Reports ongoing and new neurological symptoms, including concentration and memory issues, and chronic insomnia.  Migraine disorder with possible neurologic sequelae Severe episodes causing nausea, requiring Demerol and Phenergan . Possible neurological sequelae include concentration and memory issues.  Chronic insomnia Difficulty maintaining sleep, waking every 20-30 minutes. No current use of sleep aids due to fear of side effects.  Dry mouth (xerostomia) and possible salivary gland dysfunction Dry mouth reported, normal ultrasound of salivary glands. Sjogren's considered due to dry mouth and throat issues.        Orders: Orders Placed This Encounter  Procedures   RNP Antibody   Anti-Smith antibody   Sjogrens syndrome-A extractable nuclear antibody   Sjogrens syndrome-B extractable nuclear antibody   Anti-DNA antibody, double-stranded   C3 and C4   Beta-2  glycoprotein antibodies   Cardiolipin antibodies, IgG, IgM, IgA   Sedimentation rate   C-reactive protein   No orders of the defined types were placed in this encounter.    Follow-Up Instructions: Return in about 4 weeks (around 05/12/2024) for New pt +ANA f/u 37mo.   Lonni LELON Ester, MD  Note - This record has been created using [employer].  Chart creation errors have been sought, but may not always  have been located. Such creation errors do not reflect on  the standard of medical care.

## 2024-04-14 ENCOUNTER — Ambulatory Visit: Attending: Internal Medicine | Admitting: Internal Medicine

## 2024-04-14 ENCOUNTER — Encounter: Payer: Self-pay | Admitting: Internal Medicine

## 2024-04-14 VITALS — BP 115/77 | HR 73 | Resp 16 | Ht 68.0 in | Wt 148.2 lb

## 2024-04-14 DIAGNOSIS — R591 Generalized enlarged lymph nodes: Secondary | ICD-10-CM | POA: Diagnosis present

## 2024-04-14 DIAGNOSIS — M2550 Pain in unspecified joint: Secondary | ICD-10-CM | POA: Insufficient documentation

## 2024-04-14 DIAGNOSIS — K219 Gastro-esophageal reflux disease without esophagitis: Secondary | ICD-10-CM | POA: Insufficient documentation

## 2024-04-14 DIAGNOSIS — R768 Other specified abnormal immunological findings in serum: Secondary | ICD-10-CM | POA: Diagnosis present

## 2024-04-14 NOTE — Patient Instructions (Signed)
 I recommend checking out the [facility] [facility] [place] patient-centered guide for fibromyalgia and chronic pain management: [email or web address]

## 2024-04-15 NOTE — Telephone Encounter (Signed)
 Several attempts in contacting patient. No response. Letter mailed.

## 2024-04-18 LAB — BETA-2 GLYCOPROTEIN ANTIBODIES
Beta-2 Glyco 1 IgA: <2 U/mL (ref <20.0)
Beta-2 Glyco 1 IgM: <2 U/mL (ref <20.0)
Beta-2 Glyco I IgG: <2 U/mL (ref <20.0)

## 2024-04-18 LAB — SEDIMENTATION RATE: Sed Rate: 2 mm/h (ref 0–30)

## 2024-04-18 LAB — ANTI-SMITH ANTIBODY: ENA SM Ab Ser-aCnc: <1 AI

## 2024-04-18 LAB — C-REACTIVE PROTEIN: CRP: <3 mg/L (ref <8.0)

## 2024-04-18 LAB — ANTI-DNA ANTIBODY, DOUBLE-STRANDED: ds DNA Ab: <1 [IU]/mL

## 2024-04-18 LAB — SJOGRENS SYNDROME-A EXTRACTABLE NUCLEAR ANTIBODY: SSA (Ro) (ENA) Antibody, IgG: <1 AI

## 2024-04-18 LAB — CARDIOLIPIN ANTIBODIES, IGG, IGM, IGA
Anticardiolipin IgA: <2 [APL'U]/mL (ref <20.0)
Anticardiolipin IgG: <2 [GPL'U]/mL (ref <20.0)
Anticardiolipin IgM: <2 [MPL'U]/mL (ref <20.0)

## 2024-04-18 LAB — RNP ANTIBODY: Ribonucleic Protein(ENA) Antibody, IgG: <1 AI

## 2024-04-18 LAB — C3 AND C4
C3 Complement: 95 mg/dL (ref 83–193)
C4 Complement: 28 mg/dL (ref 15–57)

## 2024-04-18 LAB — SJOGRENS SYNDROME-B EXTRACTABLE NUCLEAR ANTIBODY: SSB (La) (ENA) Antibody, IgG: <1 AI

## 2024-04-19 ENCOUNTER — Ambulatory Visit (HOSPITAL_COMMUNITY): Admission: RE | Admit: 2024-04-19 | Source: Ambulatory Visit

## 2024-04-19 ENCOUNTER — Encounter (HOSPITAL_COMMUNITY): Payer: Self-pay

## 2024-04-19 ENCOUNTER — Ambulatory Visit (HOSPITAL_COMMUNITY): Attending: Internal Medicine

## 2024-05-03 ENCOUNTER — Other Ambulatory Visit: Payer: Self-pay | Admitting: Otolaryngology

## 2024-05-03 DIAGNOSIS — K219 Gastro-esophageal reflux disease without esophagitis: Secondary | ICD-10-CM

## 2024-05-04 ENCOUNTER — Encounter: Payer: Self-pay | Admitting: Internal Medicine

## 2024-05-04 ENCOUNTER — Other Ambulatory Visit: Payer: Self-pay | Admitting: *Deleted

## 2024-05-04 ENCOUNTER — Encounter: Payer: Self-pay | Admitting: *Deleted

## 2024-05-04 ENCOUNTER — Ambulatory Visit (INDEPENDENT_AMBULATORY_CARE_PROVIDER_SITE_OTHER): Admitting: Internal Medicine

## 2024-05-04 VITALS — BP 128/80 | HR 77 | Temp 98.9°F | Ht 69.0 in | Wt 147.8 lb

## 2024-05-04 DIAGNOSIS — R1319 Other dysphagia: Secondary | ICD-10-CM | POA: Diagnosis not present

## 2024-05-04 DIAGNOSIS — K219 Gastro-esophageal reflux disease without esophagitis: Secondary | ICD-10-CM

## 2024-05-04 DIAGNOSIS — Z1211 Encounter for screening for malignant neoplasm of colon: Secondary | ICD-10-CM

## 2024-05-04 MED ORDER — NA SULFATE-K SULFATE-MG SULF 17.5-3.13-1.6 GM/177ML PO SOLN: ORAL | 0 refills | Status: AC

## 2024-05-04 NOTE — Patient Instructions (Signed)
 It was nice to see you again today!  As discussed we will get you back on the schedule for an EGD with esophageal dilation as appropriate  Also, at same time we will plan for an average  risk screening colonoscopy.  ASA 3 (room 1 okay)  For now, continue Dexilant  60 mg daily.  Further recommendations to follow.

## 2024-05-04 NOTE — Progress Notes (Signed)
 Primary Care Physician:  Bertell Satterfield, MD Primary Gastroenterologist:  Dr. Shaaron  Pre-Procedure History & Physical: HPI:  Sharon Gay is a 58 y.o. female here for Follow-up of throat swelling GERD esophageal dysphagia known Schatzki's ring.  Seen earlier this year was set up for EGD esophageal dilation and screening colonoscopy.  She failed to keep her appointment she also failed to keep her follow-up appointment to get things rescheduled.  Since I saw her she has seen 2 ENTs for throat swelling.  She has had an evaluation they could find nothing wrong.  Reflux fairly well-controlled on Dexilant  60 mg daily.  Schatzki's ring dilated on couple occasions in the past 20 years.  Overdue for average risk rating colonoscopy.  States she was mowing the yard got too hot took 1 bottle of prep and threw up previously that is why she canceled.  Echo earlier this year was pretty much negative.  Past Medical History:  Diagnosis Date   AC (acromioclavicular) joint bone spurs    Anxiety    Arthritis    Back pain    Chest pain 09/09/2001   11/2007: Admission with unstable angina; normal coronary angiography  with [facility]; nl EF   Chronic pain    Colitis 09/10/2003   COPD (chronic obstructive pulmonary disease) (HCC)    11/2003: Normal PFTs ex minimal increased volumes and decreased the DLCO; Followed in Pulmonary clinic/ [facility]/ [facility]    Depression    Gastroesophageal reflux disease 09/10/2007   Hiatal hernia; esophageal dilatation for Schatzki's ring in 04/2008; chronic constipation; irritable bowel syndrome   History of pneumonia    Hyperlipidemia 09/10/2007   Lipid profile 07/2012:227, 91, 56, 153   Lung disease 09/09/2008   Lupus (systemic lupus erythematosus) (HCC) 09/2023   Migraines    Mitral valve prolapse    Mitral valve prolapse    MS (multiple sclerosis) (HCC)    MS (multiple sclerosis) (HCC) 09/10/1999   Mycobacterium avium complex (HCC)  09/09/2008   01/2009: + AFB stain on bronchial washings   PONV (postoperative nausea and vomiting)    Postmenopausal 10/31/2015   Smoker 10/31/2015   Stroke (HCC) 2019   x4    Past Surgical History:  Procedure Laterality Date   COLONOSCOPY W/ POLYPECTOMY  2006   Dr. Shaaron: 3 diminutive polyps in descending colon, unknown path    ESOPHAGOGASTRODUODENOSCOPY  2009   Dr. Shaaron: normal esophagus, small hiatal hernia, s/p empiric dilatilon    ESOPHAGOGASTRODUODENOSCOPY  2003   Schatzki's ring s/p dilation   FIBEROPTIC BRONCHOSCOPY  2010   LIVER BIOPSY     ORIF RADIAL FRACTURE  2012   Left   VOCAL CORD POLYP REMOVED  2003    Prior to Admission medications   Medication Sig Start Date End Date Taking? Authorizing Provider  albuterol  (PROVENTIL  HFA;VENTOLIN  HFA) 108 (90 BASE) MCG/ACT inhaler Inhale 1-2 puffs into the lungs every 6 (six) hours as needed for wheezing or shortness of breath. 08/25/15  Yes Armida Culver, PA-C  ALPRAZolam  (XANAX ) 1 MG tablet Take 1 mg by mouth 3 (three) times daily as needed for anxiety.   Yes [provider]  Aromatic Inhalants (VICKS VAPOR INHALER IN) Inhale 1 puff into the lungs 4 (four) times daily as needed (congestion).   Yes [provider]  aspirin  EC 81 MG tablet Take 81 mg by mouth See admin instructions. Take 81 mg daily, may also take 81 mg twice daily as needed for chest pain  Yes [provider]  atorvastatin  (LIPITOR) 20 MG tablet Take 20 mg by mouth daily.   Yes [provider]  Cholecalciferol (VITAMIN D3 MAXIMUM STRENGTH) 125 MCG (5000 UT) capsule Take 5,000 Units by mouth daily.   Yes [provider]  Coenzyme Q10 (COQ10) 100 MG CAPS Take 100 mg by mouth daily.   Yes [provider]  dexlansoprazole  (DEXILANT ) 60 MG capsule Take 1 capsule (60 mg total) by mouth daily. 02/03/24  Yes Arsal Tappan, Lamar HERO, MD  fluticasone  (FLONASE ) 50 MCG/ACT nasal spray Place 2 sprays into both nostrils daily as  needed for allergies.   Yes [provider]  gabapentin (NEURONTIN) 100 MG capsule Take 100 mg by mouth 3 (three) times daily as needed (pain). 03/11/23  Yes [provider]  lidocaine  (XYLOCAINE ) 5 % ointment Apply 1 application  topically daily as needed for moderate pain. 11/28/15  Yes [provider]  meloxicam  (MOBIC ) 7.5 MG tablet Take 7.5 mg by mouth daily as needed for pain.   Yes [provider]  naphazoline-glycerin (REDNESS RELIEF) 0.012-0.25 % SOLN Apply 2 drops to eye.   Yes [provider]  nicotine  (NICODERM CQ  - DOSED IN MG/24 HOURS) 21 mg/24hr patch Place 21 mg onto the skin daily.   Yes [provider]  Omega-3 Fatty Acids (FISH OIL PO) Take 1 capsule by mouth daily.   Yes [provider]  oxycodone  (ROXICODONE ) 30 MG immediate release tablet Take 30 mg by mouth 4 (four) times daily as needed for pain.   Yes [provider]  PARoxetine  (PAXIL ) 20 MG tablet Take 20 mg by mouth daily.   Yes [provider]  Polyethyl Glycol-Propyl Glycol (LUBRICATING EYE DROPS OP) Place 1 drop into both eyes daily as needed (dry eyes).   Yes [provider]  promethazine  (PHENERGAN ) 25 MG tablet Take 25 mg by mouth every 6 (six) hours as needed for nausea or vomiting.   Yes [provider]  rizatriptan  (MAXALT ) 10 MG tablet As needed for headache; may repeat in 2 hours if needed; max 2 per day or 8 per month 10/07/22  Yes Penumalli, Eduard SAUNDERS, MD    Allergies as of 05/04/2024 - Review Complete 05/04/2024  Allergen Reaction Noted   Penicillins Anaphylaxis, Nausea And Vomiting, and Swelling 08/10/2012   Morphine  Other (See Comments)    Rifampin Nausea Only and Other (See Comments) 02/06/2010   Topamax [topiramate] Other (See Comments) 10/26/2015   Cymbalta [duloxetine hcl] Other (See Comments) 10/26/2015   Tequin [gatifloxacin] Nausea And Vomiting 08/10/2012    Family History  Problem Relation Age of  Onset   Cancer Mother        cause of death   Heart attack Father 22   Fibromyalgia Sister    Arthritis Sister    Mental retardation Sister    Heart disease Sister        Also brother; cardiomyopathy   Thyroid  disease Sister    Fibromyalgia Sister    Arthritis Sister    Migraines Sister    COPD Sister    Arthritis Sister    Migraines Sister    Other Sister        brain tumor   Diabetes Brother    Hypertension Brother    Arthritis Brother    Diabetes Brother    Hypertension Brother    Arthritis Brother    Diabetes Brother    Hypertension Brother    Diabetes Brother    Hypertension Brother  Cancer Brother        cause of death   Diabetes Brother    Hypertension Brother    Arthritis Brother    Emphysema Maternal Grandfather        was a smoker   Colon cancer Neg Hx     Social History   Socioeconomic History   Marital status: Married    Spouse name: Not on file   Number of children: 2   Years of education: Not on file   Highest education level: Not on file  Occupational History   Occupation: Automotive  Tobacco Use   Smoking status: Former    Current packs/day: 0.00    Average packs/day: 0.3 packs/day for 30.0 years (7.5 ttl pk-yrs)    Types: Cigarettes    Quit date: 02/2024    Years since quitting: 0.2    Passive exposure: Current   Smokeless tobacco: Never   Tobacco comments:    smokes 4-5 cig daily  Vaping Use   Vaping status: Former  Substance and Sexual Activity   Alcohol use: No   Drug use: No   Sexual activity: Yes    Birth control/protection: Post-menopausal  Other Topics Concern   Not on file  Social History Narrative   2 stepchildren   Social Drivers of Health   Financial Resource Strain: Medium Risk (06/22/2020)   Overall Financial Resource Strain (CARDIA)    Difficulty of Paying Living Expenses: Somewhat hard  Food Insecurity: No Food Insecurity (06/22/2020)   Hunger Vital Sign    Worried About Running Out of Food in the Last  Year: Never true    Ran Out of Food in the Last Year: Never true  Transportation Needs: No Transportation Needs (06/22/2020)   PRAPARE - Administrator, Civil Service (Medical): No    Lack of Transportation (Non-Medical): No  Physical Activity: Sufficiently Active (06/22/2020)   Exercise Vital Sign    Days of Exercise per Week: 5 days    Minutes of Exercise per Session: 30 min  Stress: Stress Concern Present (06/22/2020)   [employer] of Occupational Health - Occupational Stress Questionnaire    Feeling of Stress : Very much  Social Connections: Unknown (04/24/2023)   Received from [facility]   Social Network    Social Network: Not on file  Intimate Partner Violence: Unknown (04/24/2023)   Received from [facility]   HITS    Physically Hurt: Not on file    Insult or Talk Down To: Not on file    Threaten Physical Harm: Not on file    Scream or Curse: Not on file    Review of Systems: See HPI, otherwise negative ROS  Physical Exam: BP 128/80 (BP Location: Right Arm, Patient Position: Sitting, Cuff Size: Normal)   Pulse 77   Temp 98.9 F (37.2 C) (Oral)   Ht 5' 9 (1.753 m)   Wt 147 lb 12.8 oz (67 kg)   SpO2 97%   BMI 21.83 kg/m  General:   Alert,  Well-developed, well-nourished, pleasant and cooperative in NAD Neck:  Supple; no masses or thyromegaly. No significant cervical adenopathy. Lungs:  Clear throughout to auscultation.   No wheezes, crackles, or rhonchi. No acute distress. Heart:  Regular rate and rhythm; no murmurs, clicks, rubs,  or gallops. Abdomen: Non-distended, normal bowel sounds.  Soft and nontender without appreciable mass or hepatosplenomegaly.  Pulses:  Normal pulses noted. Extremities:  Without clubbing or edema.  Impression/Plan:    58 year old lady with  chronic GERD with recurrent esophageal dysphagia known Schatzki's ring she benefited from esophageal dilation twice in the past.   We set her up for further evaluation management  via an EGD and a screening colonoscopy earlier in the year but she did not keep her appointment.  Recommendations:  As discussed we will get you back on the schedule for an EGD with esophageal dilation as appropriate.   Risk benefits limitations reviewed questions answered she is agreeable.  Also, at same time we will plan for an average  risk screening colonoscopy.  ASA 3 (room 1 okay).    Risk and benefits reviewed.  Questions answered she is agreeable.  For now, continue Dexilant  60 mg daily.  Further recommendations to follow.    Notice: This dictation was prepared with Dragon dictation along with smaller phrase technology. Any transcriptional errors that result from this process are unintentional and may not be corrected upon review.

## 2024-05-18 ENCOUNTER — Inpatient Hospital Stay: Admission: RE | Admit: 2024-05-18 | Source: Ambulatory Visit

## 2024-05-20 ENCOUNTER — Institutional Professional Consult (permissible substitution) (INDEPENDENT_AMBULATORY_CARE_PROVIDER_SITE_OTHER): Admitting: Otolaryngology

## 2024-05-27 ENCOUNTER — Ambulatory Visit: Admitting: Internal Medicine

## 2024-05-28 ENCOUNTER — Telehealth: Payer: Self-pay | Admitting: *Deleted

## 2024-05-28 ENCOUNTER — Encounter (HOSPITAL_COMMUNITY): Payer: Self-pay

## 2024-05-28 ENCOUNTER — Encounter (HOSPITAL_COMMUNITY)
Admission: RE | Admit: 2024-05-28 | Discharge: 2024-05-28 | Disposition: A | Discharge status: Home / Self Care | Source: Ambulatory Visit | Attending: Internal Medicine | Admitting: Internal Medicine

## 2024-05-28 ENCOUNTER — Other Ambulatory Visit: Payer: Self-pay

## 2024-05-28 NOTE — Telephone Encounter (Signed)
 Pt left message that she went to pre-op this morning and that no EKG or blood work was done. She says she was told if provider wanted it done, that it would be done on the morning of her procedure. She was just calling to let us  know.

## 2024-05-28 NOTE — Patient Instructions (Signed)
 Sharon Gay  05/28/2024     @PREFPERIOPPHARMACY @   Your procedure is scheduled on  06/02/2024.   Report to [facility] at  0600 A.M.   Call this number if you have problems the morning of surgery:  [phone]  If you experience any cold or flu symptoms such as cough, fever, chills, shortness of breath, etc. between now and your scheduled surgery, please notify us  at the above number.   Remember:  Follow the diet and prep instructions given to you by the office.   You may drink clear liquids until 0330 am on 06/02/2024.         Clear liquids allowed are:                    Water, Juice (No red color; non-citric and without pulp; diabetics please choose diet or no sugar options), Carbonated beverages (diabetics please choose diet or no sugar options), Clear Tea (No creamer, milk, or cream, including half & half and powdered creamer), Black Coffee Only (No creamer, milk or cream, including half & half and powdered creamer), and Clear Sports drink (No red color; diabetics please choose diet or no sugar options)    Take these medicines the morning of surgery with A SIP OF WATER       alprazolam  (if needed).dexilant , gabapentin, meloxicam  or oxycodone (if needed), paxil , maxalt .     Do not wear jewelry, make-up or nail polish, including gel polish,  artificial nails, or any other type of covering on natural nails (fingers and  toes).  Do not wear lotions, powders, or perfumes, or deodorant.  Do not shave 48 hours prior to surgery.  Men may shave face and neck.  Do not bring valuables to the hospital.  [facility] is not responsible for any belongings or valuables.  Contacts, dentures or bridgework may not be worn into surgery.  Leave your suitcase in the car.  After surgery it may be brought to your room.  For patients admitted to the hospital, discharge time will be determined by your treatment team.  Patients discharged the day of surgery will not be allowed to  drive home and must have someone with them for 24 hours.    Special instructions:   DO NOT smoke tobacco or vape for 24 hours before your procedure.  Please read over the following fact sheets that you were given. Anesthesia Post-op Instructions and Care and Recovery After Surgery      Upper Endoscopy, Adult, Care After After the procedure, it is common to have a sore throat. It is also common to have: Mild stomach pain or discomfort. Bloating. Nausea. Follow these instructions at home: The instructions below may help you care for yourself at home. Your health care provider may give you more instructions. If you have questions, ask your health care provider. If you were given a sedative during the procedure, it can affect you for several hours. Do not drive or operate machinery until your health care provider says that it is safe. If you will be going home right after the procedure, plan to have a responsible adult: Take you home from the hospital or clinic. You will not be allowed to drive. Care for you for the time you are told. Follow instructions from your health care provider about what you may eat and drink. Return to your normal activities as told by your health care provider. Ask your health care  provider what activities are safe for you. Take over-the-counter and prescription medicines only as told by your health care provider. Contact a health care provider if you: Have a sore throat that lasts longer than one day. Have trouble swallowing. Have a fever. Get help right away if you: Vomit blood or your vomit looks like coffee grounds. Have bloody, black, or tarry stools. Have a very bad sore throat or you cannot swallow. Have difficulty breathing or very bad pain in your chest or abdomen. These symptoms may be an emergency. Get help right away. Call 911. Do not wait to see if the symptoms will go away. Do not drive yourself to the hospital. Summary After the procedure, it  is common to have a sore throat, mild stomach discomfort, bloating, and nausea. If you were given a sedative during the procedure, it can affect you for several hours. Do not drive until your health care provider says that it is safe. Follow instructions from your health care provider about what you may eat and drink. Return to your normal activities as told by your health care provider. This information is not intended to replace advice given to you by your health care provider. Make sure you discuss any questions you have with your health care provider. Document Revised: 12/05/2021 Document Reviewed: 12/05/2021 Elsevier Patient Education  2024 [employer]Esophageal Dilatation Esophageal dilatation, or dilation, is done to stretch a blocked or narrowed part of your esophagus. The esophagus is the part of your body that moves food from your mouth to your stomach. You may need to have it stretched if: You have a lot of scar tissue and it makes it hard or painful to swallow. You have cancer of the esophagus. There's a problem with how food moves through your esophagus. In some cases, you may need to have this procedure done more than once. Tell a health care provider about: Any allergies you have. All medicines you're taking, including vitamins, herbs, eye drops, creams, and over-the-counter medicines. Any problems you or family members have had with anesthesia. Any bleeding problems you have. Any surgeries you've had. Any medical conditions you have. Whether you're pregnant or may be pregnant. What are the risks? Your health care provider will talk with you about risks. These may include: Bleeding. A hole or tear in your esophagus. What happens before the procedure? When to stop eating and drinking Follow instructions from your provider about what you may eat and drink. These may include: 8 hours before your procedure Stop eating most foods. Do not eat meat, fried foods, or fatty  foods. Eat only light foods, such as toast or crackers. All liquids are okay except energy drinks and alcohol. 6 hours before your procedure Stop eating. Drink only clear liquids, such as water, clear fruit juice, black coffee, plain tea, and sports drinks. Do not drink energy drinks or alcohol. 2 hours before your procedure Stop drinking all liquids. You may be allowed to take medicines with small sips of water. If you don't follow your provider's instructions, your procedure may be delayed or canceled. Medicines Ask your provider about: Changing or stopping your regular medicines. These include any diabetes medicines or blood thinners you take. Taking medicines such as aspirin  and ibuprofen . These medicines can thin your blood. Do not take them unless your provider tells you to. Taking over-the-counter medicines, vitamins, herbs, and supplements. General instructions If you'll be going home right after the procedure, plan to have a responsible adult: Take you home from  the hospital or clinic. You won't be allowed to drive. Care for you for the time you're told. What happens during the procedure? You may be given: A sedative. This helps you relax. Anesthesia. This keeps you from feeling pain. It will numb certain areas of your body. The stretching may be done with: Simple dilators. These are tools put in your esophagus to stretch it. Guide wires. These wires are put in using a tube called an endoscope. A dilator is put over the wires to stretch your esophagus. Then the wires are taken out. A balloon. The balloon is on the end of a tube. It's inflated to stretch your esophagus. The procedure may vary among providers and hospitals. What can I expect after the procedure? Your blood pressure, heart rate, breathing rate, and blood oxygen level will be monitored until you leave the hospital or clinic. Your throat may feel sore and numb. This will get better over time. You won't be allowed  to eat or drink until your throat is no longer numb. You may be able to go home when you can: Drink. Pee. Sit on the edge of the bed without nausea or dizziness. Follow these instructions at home: Activity If you were given a sedative during the procedure, it can affect you for several hours. Do not drive or operate machinery until your provider says it's safe. Return to your normal activities as told by your provider. Ask your provider what activities are safe for you. General instructions Take over-the-counter and prescription medicines only as told by your provider. Follow instructions from your provider about what you may eat and drink. Do not use any products that contain nicotine  or tobacco. These products include cigarettes, chewing tobacco, and vaping devices, such as e-cigarettes. If you need help quitting, ask your provider. Keep all follow-up visits. Your provider will make sure the procedure worked. Where to find more information American Society for Gastrointestinal Endoscopy (ASGE): asge.org Contact a health care provider if: You have trouble swallowing. You have a fever. Your pain doesn't get better with medicine. Get help right away if: You have chest pain. You have trouble breathing. You vomit blood. Your poop is: Black. Tarry. Bloody. These symptoms may be an emergency. Get help right away. Call 911. Do not wait to see if the symptoms will go away. Do not drive yourself to the hospital. This information is not intended to replace advice given to you by your health care provider. Make sure you discuss any questions you have with your health care provider. Document Revised: 11/22/2022 Document Reviewed: 11/22/2022 Elsevier Patient Education  2024 Else[employer].Colonoscopy, Adult, Care After The following information offers guidance on how to care for yourself after your procedure. Your health care provider may also give you more specific instructions. If you have  problems or questions, contact your health care provider. What can I expect after the procedure? After the procedure, it is common to have: A small amount of blood in your stool for 24 hours after the procedure. Some gas. Mild cramping or bloating of your abdomen. Follow these instructions at home: Eating and drinking  Drink enough fluid to keep your urine pale yellow. Follow instructions from your health care provider about eating or drinking restrictions. Resume your normal diet as told by your health care provider. Avoid heavy or fried foods that are hard to digest. Activity Rest as told by your health care provider. Avoid sitting for a long time without moving. Get up to take short walks  every 1-2 hours. This is important to improve blood flow and breathing. Ask for help if you feel weak or unsteady. Return to your normal activities as told by your health care provider. Ask your health care provider what activities are safe for you. Managing cramping and bloating  Try walking around when you have cramps or feel bloated. If directed, apply heat to your abdomen as told by your health care provider. Use the heat source that your health care provider recommends, such as a moist heat pack or a heating pad. Place a towel between your skin and the heat source. Leave the heat on for 20-30 minutes. Remove the heat if your skin turns bright red. This is especially important if you are unable to feel pain, heat, or cold. You have a greater risk of getting burned. General instructions If you were given a sedative during the procedure, it can affect you for several hours. Do not drive or operate machinery until your health care provider says that it is safe. For the first 24 hours after the procedure: Do not sign important documents. Do not drink alcohol. Do your regular daily activities at a slower pace than normal. Eat soft foods that are easy to digest. Take over-the-counter and prescription  medicines only as told by your health care provider. Keep all follow-up visits. This is important. Contact a health care provider if: You have blood in your stool 2-3 days after the procedure. Get help right away if: You have more than a small spotting of blood in your stool. You have large blood clots in your stool. You have swelling of your abdomen. You have nausea or vomiting. You have a fever. You have increasing pain in your abdomen that is not relieved with medicine. These symptoms may be an emergency. Get help right away. Call 911. Do not wait to see if the symptoms will go away. Do not drive yourself to the hospital. Summary After the procedure, it is common to have a small amount of blood in your stool. You may also have mild cramping and bloating of your abdomen. If you were given a sedative during the procedure, it can affect you for several hours. Do not drive or operate machinery until your health care provider says that it is safe. Get help right away if you have a lot of blood in your stool, nausea or vomiting, a fever, or increased pain in your abdomen. This information is not intended to replace advice given to you by your health care provider. Make sure you discuss any questions you have with your health care provider. Document Revised: 10/08/2022 Document Reviewed: 04/18/2021 Elsevier Patient Education  2024 Else[employer] Inc.General Anesthesia, Adult, Care After The following information offers guidance on how to care for yourself after your procedure. Your health care provider may also give you more specific instructions. If you have problems or questions, contact your health care provider. What can I expect after the procedure? After the procedure, it is common for people to: Have pain or discomfort at the IV site. Have nausea or vomiting. Have a sore throat or hoarseness. Have trouble concentrating. Feel cold or chills. Feel weak, sleepy, or tired (fatigue). Have  soreness and body aches. These can affect parts of the body that were not involved in surgery. Follow these instructions at home: For the time period you were told by your health care provider:  Rest. Do not participate in activities where you could fall or become injured. Do not drive or  use machinery. Do not drink alcohol. Do not take sleeping pills or medicines that cause drowsiness. Do not make important decisions or sign legal documents. Do not take care of children on your own. General instructions Drink enough fluid to keep your urine pale yellow. If you have sleep apnea, surgery and certain medicines can increase your risk for breathing problems. Follow instructions from your health care provider about wearing your sleep device: Anytime you are sleeping, including during daytime naps. While taking prescription pain medicines, sleeping medicines, or medicines that make you drowsy. Return to your normal activities as told by your health care provider. Ask your health care provider what activities are safe for you. Take over-the-counter and prescription medicines only as told by your health care provider. Do not use any products that contain nicotine  or tobacco. These products include cigarettes, chewing tobacco, and vaping devices, such as e-cigarettes. These can delay incision healing after surgery. If you need help quitting, ask your health care provider. Contact a health care provider if: You have nausea or vomiting that does not get better with medicine. You vomit every time you eat or drink. You have pain that does not get better with medicine. You cannot urinate or have bloody urine. You develop a skin rash. You have a fever. Get help right away if: You have trouble breathing. You have chest pain. You vomit blood. These symptoms may be an emergency. Get help right away. Call 911. Do not wait to see if the symptoms will go away. Do not drive yourself to the  hospital. Summary After the procedure, it is common to have a sore throat, hoarseness, nausea, vomiting, or to feel weak, sleepy, or fatigue. For the time period you were told by your health care provider, do not drive or use machinery. Get help right away if you have difficulty breathing, have chest pain, or vomit blood. These symptoms may be an emergency. This information is not intended to replace advice given to you by your health care provider. Make sure you discuss any questions you have with your health care provider. Document Revised: 11/23/2021 Document Reviewed: 11/23/2021 Elsevier Patient Education  2024 [employer].

## 2024-05-31 NOTE — Telephone Encounter (Signed)
 [employer] PA: [employer], Tracking #: [id number]

## 2024-06-01 ENCOUNTER — Encounter (HOSPITAL_COMMUNITY): Payer: Self-pay | Admitting: Certified Registered Nurse Anesthetist

## 2024-06-02 ENCOUNTER — Encounter (HOSPITAL_COMMUNITY): Admission: RE | Payer: Self-pay | Source: Home / Self Care

## 2024-06-02 ENCOUNTER — Telehealth: Payer: Self-pay | Admitting: *Deleted

## 2024-06-02 ENCOUNTER — Ambulatory Visit (HOSPITAL_COMMUNITY): Admission: RE | Admit: 2024-06-02 | Source: Home / Self Care | Admitting: Internal Medicine

## 2024-06-02 ENCOUNTER — Encounter (HOSPITAL_COMMUNITY): Payer: Self-pay | Admitting: Certified Registered Nurse Anesthetist

## 2024-06-02 SURGERY — COLONOSCOPY
Anesthesia: Choice

## 2024-06-02 MED ORDER — PROPOFOL 500 MG/50ML IV EMUL
INTRAVENOUS | Status: AC
  Filled 2024-06-02: qty 50

## 2024-06-02 NOTE — Telephone Encounter (Signed)
 Message sent to endo to cancel

## 2024-06-02 NOTE — Progress Notes (Signed)
 Patient no show for procedure this am. Spoke with spouse over the phone. He states patient is back in bed and sick with headache. The prep did not work she is still going dark brown stool. Patient is not cleaned out per spouse and she wants to cancel this am. Instructed patient to call Dr.Rourk's office this morning at 8 am to let them know.

## 2024-06-02 NOTE — Telephone Encounter (Signed)
-----   Message from Gwendloyn Iha D sent at 06/02/2024  8:32 AM EDT ----- Regarding: Patient no show for procedures this am Good Morning Tammy,  The above patient was a no show this am and when the nurse called she spoke to her husband who stated she was still having brown stool and she was back in bed with a headache and wanted to cancel for today. The nurse told him to call the office at 8am to get rescheduled.  Thank you, Luke FALCON.

## 2024-06-07 ENCOUNTER — Ambulatory Visit (HOSPITAL_COMMUNITY)

## 2024-06-09 ENCOUNTER — Encounter: Payer: Self-pay | Admitting: *Deleted

## 2024-06-10 ENCOUNTER — Other Ambulatory Visit

## 2024-08-03 NOTE — Progress Notes (Deleted)
 Office Visit Note  Patient: Sharon Gay             Date of Birth: [birth year]           MRN: [id number]             PCP: Patient, No Pcp Per Referring: Sharon Satterfield, MD Visit Date: 08/04/2024   Subjective:  No chief complaint on file.   History of Present Illness: Sharon Gay is a 58 y.o. female here for follow up ***   Previous HPI 04/14/24 Sharon Gay is a 58 year old female with multiple sclerosis who presents with persistent joint pain and abnormal blood test results, including a positive ANA.   She experiences persistent joint pain and stiffness, described as deep bone pain and throbbing, affecting her bones, feet, elbows, and joints. The pain has been ongoing for years, with an increase in frequency to a weekly basis. She uses lidocaine , oxycodone , meloxicam , and over-the-counter topical treatments like [facility], but finds them insufficient at times. She also experiences swelling in her fingers and ankles, particularly in the morning, with variable resolution throughout the day.   She has a history of multiple sclerosis diagnosed in 2000-2001, initially presenting with numbness and tingling on the left side, leading to the discovery of a lesion on her head. She was treated with three different medications, which she discontinued due to adverse effects, including difficulty breathing. She also reports circulation issues, with tingling and numbness in her fingers, and occasional color changes to purple or white.   She experiences skin tenderness and sensitivity, describing it as painful and ticklish to the touch, even when her poodle brushes against her. This symptom is persistent and affects her daily life. She also reports dry skin, which she attributes to recent use of calamine lotion.   She has a history of migraines since her early thirties, which were severe enough to require Demerol and Phenergan  injections. The frequency of migraines has decreased over time, but  she still experiences concentration and short-term memory issues. She has been hospitalized for suspected strokes, but a neurologist later indicated she had not experienced strokes, attributing symptoms to severe migraines.   She reports poor sleep quality, waking up every 20-30 minutes without any specific trigger. She does not take any sleep aids due to concerns about side effects.   She has been told she has fibromyalgia, osteoporosis, and pinched nerves along her spine. She maintains a level of physical activity, including walking and light exercise, but is limited by pain and stiffness.   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 She is a former smoker.   She reports a history of acid reflux and takes medication for it. She has been informed of a Schatzki's ring in her esophagus, which causes swallowing difficulties.       Labs reviewed 11/2023 ANA 1: 40 speckled RF negative ESR 2   No Rheumatology ROS completed.   PMFS History:  Patient Active Problem List   Diagnosis Date Noted   Polyarthralgia 04/14/2024   Positive ANA (antinuclear antibody) 04/14/2024   Constipation 07/01/2023   Upper abdominal pain 02/11/2023   Esophageal dysphagia 02/11/2023   GERD (gastroesophageal reflux disease) 02/11/2023   Encounter for gynecological examination with Papanicolaou smear of cervix 06/22/2020   Encounter for screening colonoscopy 06/22/2020   Dyspareunia in female 06/22/2020   Vaginal dryness  06/22/2020   Pelvic pain 06/22/2020   Arthritis 06/20/2018   Former smoker 10/31/2015   Postmenopausal 10/31/2015   History of colonic polyps 10/20/2015   Dysphagia 10/20/2015   Hepatomegaly 10/20/2015   Lymphadenopathy 09/07/2015   CAP (community acquired pneumonia) 08/25/2015   Severe sepsis with septic shock (HCC) 08/25/2015   SIRS (systemic inflammatory response syndrome)  (HCC) 08/25/2015   Abdominal pain 08/09/2015   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   History of diagnostic tests 10/06/2012   Hyperlipidemia    Mitral valve prolapse    Anxiety    Asthmatic bronchitis , chronic (HCC)    Chest pain    Gastroesophageal reflux disease    Migraine headache 03/30/2009    Past Medical History:  Diagnosis Date   AC (acromioclavicular) joint bone spurs    Anxiety    Arthritis    Back pain    Chest pain 09/09/2001   11/2007: Admission with unstable angina; normal coronary angiography  with [facility]; nl EF   Chronic pain    Colitis 09/10/2003   COPD (chronic obstructive pulmonary disease) (HCC)    11/2003: Normal PFTs ex minimal increased volumes and decreased the DLCO; Followed in Pulmonary clinic/ [facility]/ [place]    Depression    Gastroesophageal reflux disease 09/10/2007   Hiatal hernia; esophageal dilatation for Schatzki's ring in 04/2008; chronic constipation; irritable bowel syndrome   History of pneumonia    Hyperlipidemia 09/10/2007   Lipid profile 07/2012:227, 91, 56, 153   Lung disease 09/09/2008   Lupus (systemic lupus erythematosus) (HCC) 09/2023   Migraines    Mitral valve prolapse    Mitral valve prolapse    MS (multiple sclerosis)    MS (multiple sclerosis) 09/10/1999   Mycobacterium avium complex (HCC) 09/09/2008   01/2009: + AFB stain on bronchial washings   PONV (postoperative nausea and vomiting)    Postmenopausal 10/31/2015   Smoker 10/31/2015   Stroke (HCC) 2019   x4    Family History  Problem Relation Age of Onset   Cancer Mother        cause of death   Heart attack Father 1   Fibromyalgia Sister    Arthritis Sister    Mental retardation Sister    Heart disease Sister        Also brother; cardiomyopathy   Thyroid  disease Sister    Fibromyalgia Sister    Arthritis Sister    Migraines Sister    COPD Sister    Arthritis Sister    Migraines Sister    Other Sister         brain tumor   Diabetes Brother    Hypertension Brother    Arthritis Brother    Diabetes Brother    Hypertension Brother    Arthritis Brother    Diabetes Brother    Hypertension Brother    Diabetes Brother    Hypertension Brother    Cancer Brother        cause of death   Diabetes Brother    Hypertension Brother    Arthritis Brother    Emphysema Maternal Grandfather        was a smoker   Colon cancer Neg Hx    Past Surgical History:  Procedure Laterality Date   COLONOSCOPY W/ POLYPECTOMY  2006   Dr. Shaaron: 3 diminutive polyps in descending colon, unknown path    ESOPHAGOGASTRODUODENOSCOPY  2009   Dr. Shaaron: normal esophagus, small hiatal hernia, s/p empiric dilatilon  ESOPHAGOGASTRODUODENOSCOPY  2003   Schatzki's ring s/p dilation   FIBEROPTIC BRONCHOSCOPY  2010   LIVER BIOPSY     ORIF RADIAL FRACTURE  2012   Left   VOCAL CORD POLYP REMOVED  2003   Social History   Social History Narrative   2 stepchildren   Immunization History  Administered Date(s) Administered   Influenza-Unspecified 05/04/2015, 09/10/2018   Pneumococcal Polysaccharide-23 08/26/2015   Pneumococcal-Unspecified 04/07/2010     Objective: Vital Signs: There were no vitals taken for this visit.   Physical Exam   Musculoskeletal Exam: ***  CDAI Exam: CDAI Score: -- Patient Global: --; Provider Global: -- Swollen: --; Tender: -- Joint Exam 08/04/2024   No joint exam has been documented for this visit   There is currently no information documented on the homunculus. Go to the Rheumatology activity and complete the homunculus joint exam.  Investigation: No additional findings.  Imaging: No results found.  Recent Labs: Lab Results  Component Value Date   WBC 8.4 10/04/2023   HGB 13.6 10/04/2023   PLT 252 10/04/2023   NA 140 10/04/2023   K 3.6 10/04/2023   CL 105 10/04/2023   CO2 26 10/04/2023   GLUCOSE 84 10/04/2023   BUN 11 10/04/2023   CREATININE 0.72 10/04/2023    BILITOT 0.5 10/04/2023   ALKPHOS 53 10/04/2023   AST 17 10/04/2023   ALT 15 10/04/2023   PROT 6.7 10/04/2023   ALBUMIN 3.8 10/04/2023   CALCIUM  9.4 10/04/2023   GFRAA >60 09/03/2018    Speciality Comments: No specialty comments available.  Procedures:  No procedures performed Allergies: Penicillins, Morphine , Rifampin, Topamax [topiramate], Cymbalta [duloxetine hcl], and Tequin [gatifloxacin]   Assessment / Plan:     Visit Diagnoses: No diagnosis found.  ***  Orders: No orders of the defined types were placed in this encounter.  No orders of the defined types were placed in this encounter.    Follow-Up Instructions: No follow-ups on file.   Lonni LELON Ester, MD  Note - This record has been created using [employer].  Chart creation errors have been sought, but may not always  have been located. Such creation errors do not reflect on  the standard of medical care.

## 2024-08-04 ENCOUNTER — Ambulatory Visit: Admitting: Internal Medicine

## 2024-09-12 NOTE — Progress Notes (Unsigned)
 "  Office Visit Note  Patient: Sharon Gay             Date of Birth: Apr 26, 1966           MRN: [id number]             PCP: Sharon Lynwood NOVAK, MD Referring: Sharon Satterfield, MD Visit Date: 09/16/2024   Subjective:  No chief complaint on file.   History of Present Illness: Sharon Gay is a 59 y.o. female here for follow up ***   Previous HPI 04/14/24 Sharon Gay is a 59 year old female with multiple sclerosis who presents with persistent joint pain and abnormal blood test results, including a positive ANA.   She experiences persistent joint pain and stiffness, described as deep bone pain and throbbing, affecting her bones, feet, elbows, and joints. The pain has been ongoing for years, with an increase in frequency to a weekly basis. She uses lidocaine , oxycodone , meloxicam , and over-the-counter topical treatments like [facility], but finds them insufficient at times. She also experiences swelling in her fingers and ankles, particularly in the morning, with variable resolution throughout the day.   She has a history of multiple sclerosis diagnosed in 2000-2001, initially presenting with numbness and tingling on the left side, leading to the discovery of a lesion on her head. She was treated with three different medications, which she discontinued due to adverse effects, including difficulty breathing. She also reports circulation issues, with tingling and numbness in her fingers, and occasional color changes to purple or white.   She experiences skin tenderness and sensitivity, describing it as painful and ticklish to the touch, even when her poodle brushes against her. This symptom is persistent and affects her daily life. She also reports dry skin, which she attributes to recent use of calamine lotion.   She has a history of migraines since her early thirties, which were severe enough to require Demerol and Phenergan  injections. The frequency of migraines has decreased over time, but  she still experiences concentration and short-term memory issues. She has been hospitalized for suspected strokes, but a neurologist later indicated she had not experienced strokes, attributing symptoms to severe migraines.   She reports poor sleep quality, waking up every 20-30 minutes without any specific trigger. She does not take any sleep aids due to concerns about side effects.   She has been told she has fibromyalgia, osteoporosis, and pinched nerves along her spine. She maintains a level of physical activity, including walking and light exercise, but is limited by pain and stiffness.   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 She is a former smoker.   She reports a history of acid reflux and takes medication for it. She has been informed of a Schatzki's ring in her esophagus, which causes swallowing difficulties.       Labs reviewed 11/2023 ANA 1: 40 speckled RF negative ESR 2   No Rheumatology ROS completed.   PMFS History:  Patient Active Problem List   Diagnosis Date Noted   Polyarthralgia 04/14/2024   Positive ANA (antinuclear antibody) 04/14/2024   Constipation 07/01/2023   Upper abdominal pain 02/11/2023   Esophageal dysphagia 02/11/2023   GERD (gastroesophageal reflux disease) 02/11/2023   Encounter for gynecological examination with Papanicolaou smear of cervix 06/22/2020   Encounter for screening colonoscopy 06/22/2020   Dyspareunia in female 06/22/2020   Vaginal  dryness 06/22/2020   Pelvic pain 06/22/2020   Arthritis 06/20/2018   Former smoker 10/31/2015   Postmenopausal 10/31/2015   History of colonic polyps 10/20/2015   Dysphagia 10/20/2015   Hepatomegaly 10/20/2015   Lymphadenopathy 09/07/2015   CAP (community acquired pneumonia) 08/25/2015   Severe sepsis with septic shock (HCC) 08/25/2015   SIRS (systemic inflammatory response syndrome)  (HCC) 08/25/2015   Abdominal pain 08/09/2015   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   History of diagnostic tests 10/06/2012   Hyperlipidemia    Mitral valve prolapse    Anxiety    Asthmatic bronchitis , chronic (HCC)    Chest pain    Gastroesophageal reflux disease    Migraine headache 03/30/2009    Past Medical History:  Diagnosis Date   AC (acromioclavicular) joint bone spurs    Anxiety    Arthritis    Back pain    Chest pain 09/09/2001   11/2007: Admission with unstable angina; normal coronary angiography  with [facility]; nl EF   Chronic pain    Colitis 09/10/2003   COPD (chronic obstructive pulmonary disease) (HCC)    11/2003: Normal PFTs ex minimal increased volumes and decreased the DLCO; Followed in Pulmonary clinic/ [facility]/ [place]    Depression    Gastroesophageal reflux disease 09/10/2007   Hiatal hernia; esophageal dilatation for Schatzki's ring in 04/2008; chronic constipation; irritable bowel syndrome   History of pneumonia    Hyperlipidemia 09/10/2007   Lipid profile 07/2012:227, 91, 56, 153   Lung disease 09/09/2008   Lupus (systemic lupus erythematosus) (HCC) 09/2023   Migraines    Mitral valve prolapse    Mitral valve prolapse    MS (multiple sclerosis)    MS (multiple sclerosis) 09/10/1999   Mycobacterium avium complex (HCC) 09/09/2008   01/2009: + AFB stain on bronchial washings   PONV (postoperative nausea and vomiting)    Postmenopausal 10/31/2015   Smoker 10/31/2015   Stroke (HCC) 2019   x4    Family History  Problem Relation Age of Onset   Cancer Mother        cause of death   Heart attack Father 10   Fibromyalgia Sister    Arthritis Sister    Mental retardation Sister    Heart disease Sister        Also brother; cardiomyopathy   Thyroid  disease Sister    Fibromyalgia Sister    Arthritis Sister    Migraines Sister    COPD Sister    Arthritis Sister    Migraines Sister    Other Sister         brain tumor   Diabetes Brother    Hypertension Brother    Arthritis Brother    Diabetes Brother    Hypertension Brother    Arthritis Brother    Diabetes Brother    Hypertension Brother    Diabetes Brother    Hypertension Brother    Cancer Brother        cause of death   Diabetes Brother    Hypertension Brother    Arthritis Brother    Emphysema Maternal Grandfather        was a smoker   Colon cancer Neg Hx    Past Surgical History:  Procedure Laterality Date   COLONOSCOPY W/ POLYPECTOMY  2006   Dr. Shaaron: 3 diminutive polyps in descending colon, unknown path    ESOPHAGOGASTRODUODENOSCOPY  2009   Dr. Shaaron: normal esophagus, small hiatal hernia, s/p empiric dilatilon  ESOPHAGOGASTRODUODENOSCOPY  2003   Schatzki's ring s/p dilation   FIBEROPTIC BRONCHOSCOPY  2010   LIVER BIOPSY     ORIF RADIAL FRACTURE  2012   Left   VOCAL CORD POLYP REMOVED  2003   Social History   Social History Narrative   2 stepchildren   Immunization History  Administered Date(s) Administered   Influenza-Unspecified 05/04/2015, 09/10/2018   Pneumococcal Polysaccharide-23 08/26/2015   Pneumococcal-Unspecified 04/07/2010     Objective: Vital Signs: There were no vitals taken for this visit.   Physical Exam   Musculoskeletal Exam: ***  CDAI Exam: CDAI Score: -- Patient Global: --; Provider Global: -- Swollen: --; Tender: -- Joint Exam 09/16/2024   No joint exam has been documented for this visit   There is currently no information documented on the homunculus. Go to the Rheumatology activity and complete the homunculus joint exam.  Investigation: No additional findings.  Imaging: No results found.  Recent Labs: Lab Results  Component Value Date   WBC 8.4 10/04/2023   HGB 13.6 10/04/2023   PLT 252 10/04/2023   NA 140 10/04/2023   K 3.6 10/04/2023   CL 105 10/04/2023   CO2 26 10/04/2023   GLUCOSE 84 10/04/2023   BUN 11 10/04/2023   CREATININE 0.72 10/04/2023    BILITOT 0.5 10/04/2023   ALKPHOS 53 10/04/2023   AST 17 10/04/2023   ALT 15 10/04/2023   PROT 6.7 10/04/2023   ALBUMIN 3.8 10/04/2023   CALCIUM  9.4 10/04/2023   GFRAA >60 09/03/2018    Speciality Comments: No specialty comments available.  Procedures:  No procedures performed Allergies: Penicillins, Morphine , Rifampin, Topamax [topiramate], Cymbalta [duloxetine hcl], and Tequin [gatifloxacin]   Assessment / Plan:     Visit Diagnoses: No diagnosis found.  ***  Orders: No orders of the defined types were placed in this encounter.  No orders of the defined types were placed in this encounter.    Follow-Up Instructions: No follow-ups on file.   Lonni LELON Ester, MD  Note - This record has been created using [employer].  Chart creation errors have been sought, but may not always  have been located. Such creation errors do not reflect on  the standard of medical care. "

## 2024-09-13 ENCOUNTER — Encounter: Payer: Self-pay | Admitting: Gastroenterology

## 2024-09-16 ENCOUNTER — Ambulatory Visit: Attending: Internal Medicine | Admitting: Internal Medicine

## 2024-10-11 ENCOUNTER — Ambulatory Visit: Admitting: Gastroenterology

## 2024-11-15 ENCOUNTER — Ambulatory Visit: Payer: Self-pay

## 2024-12-01 ENCOUNTER — Ambulatory Visit: Admitting: Gastroenterology
